# Patient Record
Sex: Female | Born: 1955 | Race: White | Hispanic: No | Marital: Married | Smoking: Never smoker
Health system: Southern US, Community
[De-identification: ages and names within clinical notes are randomized; demographics above are authoritative.]

## PROBLEM LIST (undated history)

## (undated) DIAGNOSIS — M1990 Unspecified osteoarthritis, unspecified site: Secondary | ICD-10-CM

## (undated) DIAGNOSIS — I340 Nonrheumatic mitral (valve) insufficiency: Secondary | ICD-10-CM

## (undated) DIAGNOSIS — M419 Scoliosis, unspecified: Secondary | ICD-10-CM

## (undated) DIAGNOSIS — C50919 Malignant neoplasm of unspecified site of unspecified female breast: Secondary | ICD-10-CM

## (undated) DIAGNOSIS — D259 Leiomyoma of uterus, unspecified: Secondary | ICD-10-CM

## (undated) DIAGNOSIS — I8390 Asymptomatic varicose veins of unspecified lower extremity: Secondary | ICD-10-CM

## (undated) HISTORY — DX: Nonrheumatic mitral (valve) insufficiency: I34.0

## (undated) HISTORY — DX: Malignant neoplasm of unspecified site of unspecified female breast: C50.919

## (undated) HISTORY — PX: OTHER SURGICAL HISTORY: SHX169

## (undated) HISTORY — PX: GANGLION CYST EXCISION: SHX1691

## (undated) HISTORY — DX: Unspecified osteoarthritis, unspecified site: M19.90

## (undated) HISTORY — DX: Leiomyoma of uterus, unspecified: D25.9

## (undated) HISTORY — DX: Scoliosis, unspecified: M41.9

## (undated) HISTORY — DX: Asymptomatic varicose veins of unspecified lower extremity: I83.90

---

## 1996-12-05 HISTORY — PX: OTHER SURGICAL HISTORY: SHX169

## 1998-12-04 ENCOUNTER — Other Ambulatory Visit: Admission: RE | Admit: 1998-12-04 | Discharge: 1998-12-04 | Payer: Self-pay | Admitting: *Deleted

## 1999-12-08 ENCOUNTER — Other Ambulatory Visit: Admission: RE | Admit: 1999-12-08 | Discharge: 1999-12-08 | Payer: Self-pay | Admitting: *Deleted

## 2000-01-07 ENCOUNTER — Ambulatory Visit (HOSPITAL_COMMUNITY): Admission: RE | Admit: 2000-01-07 | Discharge: 2000-01-07 | Payer: Self-pay | Admitting: *Deleted

## 2000-05-09 ENCOUNTER — Encounter (INDEPENDENT_AMBULATORY_CARE_PROVIDER_SITE_OTHER): Payer: Self-pay

## 2000-05-09 ENCOUNTER — Observation Stay (HOSPITAL_COMMUNITY): Admission: RE | Admit: 2000-05-09 | Discharge: 2000-05-10 | Payer: Self-pay | Admitting: Obstetrics and Gynecology

## 2000-11-23 ENCOUNTER — Encounter (INDEPENDENT_AMBULATORY_CARE_PROVIDER_SITE_OTHER): Payer: Self-pay | Admitting: Specialist

## 2000-11-23 ENCOUNTER — Ambulatory Visit (HOSPITAL_COMMUNITY): Admission: RE | Admit: 2000-11-23 | Discharge: 2000-11-23 | Payer: Self-pay | Admitting: Obstetrics and Gynecology

## 2000-12-11 ENCOUNTER — Other Ambulatory Visit: Admission: RE | Admit: 2000-12-11 | Discharge: 2000-12-11 | Payer: Self-pay | Admitting: Obstetrics and Gynecology

## 2001-09-27 ENCOUNTER — Encounter: Admission: RE | Admit: 2001-09-27 | Discharge: 2001-09-27 | Payer: Self-pay | Admitting: Family Medicine

## 2001-09-27 ENCOUNTER — Encounter: Payer: Self-pay | Admitting: Family Medicine

## 2001-12-20 ENCOUNTER — Other Ambulatory Visit: Admission: RE | Admit: 2001-12-20 | Discharge: 2001-12-20 | Payer: Self-pay | Admitting: Obstetrics and Gynecology

## 2003-02-11 ENCOUNTER — Other Ambulatory Visit: Admission: RE | Admit: 2003-02-11 | Discharge: 2003-02-11 | Payer: Self-pay | Admitting: Obstetrics and Gynecology

## 2004-04-27 ENCOUNTER — Other Ambulatory Visit: Admission: RE | Admit: 2004-04-27 | Discharge: 2004-04-27 | Payer: Self-pay | Admitting: Obstetrics and Gynecology

## 2006-11-27 ENCOUNTER — Encounter: Admission: RE | Admit: 2006-11-27 | Discharge: 2006-11-27 | Payer: Self-pay | Admitting: Family Medicine

## 2007-05-17 ENCOUNTER — Encounter: Admission: RE | Admit: 2007-05-17 | Discharge: 2007-05-17 | Payer: Self-pay | Admitting: Orthopedic Surgery

## 2007-08-21 ENCOUNTER — Encounter: Admission: RE | Admit: 2007-08-21 | Discharge: 2007-08-21 | Payer: Self-pay | Admitting: Orthopedic Surgery

## 2009-06-22 ENCOUNTER — Encounter: Admission: RE | Admit: 2009-06-22 | Discharge: 2009-09-07 | Payer: Self-pay | Admitting: Internal Medicine

## 2011-01-24 ENCOUNTER — Ambulatory Visit (HOSPITAL_BASED_OUTPATIENT_CLINIC_OR_DEPARTMENT_OTHER): Admission: RE | Admit: 2011-01-24 | Payer: 59 | Source: Ambulatory Visit | Admitting: Surgery

## 2011-04-22 NOTE — Op Note (Signed)
[facility]  Patient:    Isabel [name], Isabel [name]                   MRN: [id number] Proc. Date: 11/23/00 Adm. Date:  [id number] Attending:  Lenoard Aden CC:         [facility]   Operative Report  PREOPERATIVE DIAGNOSIS:       Persistent menometrorrhagia with submucous                               fibroid.  POSTOPERATIVE DIAGNOSIS:      Persistent menometrorrhagia with submucous                               fibroid.  OPERATION:                    Diagnostic hysteroscopy, resectoscopic                               myomectomy.  SURGEON:                      Lenoard Aden, M.D.  ANESTHESIA:                   General by Dr. Jean Rosenthal.  ESTIMATED BLOOD LOSS:         About 50 cc.  COMPLICATIONS:                None.  FLUID DEFICIT:                200 cc.  COUNTS:                       Correct.  DISPOSITION:                  The patient to recovery in good condition.  DESCRIPTION OF PROCEDURE:     After being apprised of the risks of anesthesia, infection, bleeding, uterine perforation, need for repair, the patient was brought to the operating room where she was administered general anesthesia and prepped and draped in the usual sterile fashion.  Exam under anesthesia revealed the mid positioned uterus.  No adnexal masses are appreciated.  A weighted speculum is placed.  Single-tooth tenaculum used to grasp the anterior lip of the cervix.  Dilute Pitressin solution placed at 3 and 9 oclocK at the cervicovaginal junction.  Total of 12 cc, being careful to avoid intravascular injection.  At this time, cervix easily dilated up to a #31 Pratt dilator.  Hysteroscope placed.  Visualization reveals a posterior to right lateral sidewall myoma which was resected, using a right angle double-loop atraumatically.  There is a small fibroid along the anterior wall just superior to the cervix which was also resected without difficulty. Bilateral tubal  ostia appear normal.  After resection of the fibroid down to the level with the endometrium, hysteroscope is removed.  Uterus is massaged with bimanual massage, and repeat resection is performed for some small fibroid fragments which are noted.  Good hemostasis is achieved.  Pictures are taken.  Instruments are removed with a deficit of 200 cc noted.  The patient tolerated the procedure well and is transferred to recovery in good condition. DD:  11/23/00 TD:  11/24/00 Job:  [id number]

## 2011-04-22 NOTE — Op Note (Signed)
[facility]  Patient:    Isabel [name], Isabel [name]                   MRN: [id number] Proc. Date: 05/09/00 Adm. Date:  [id number] Disc. Date: [id number] Attending:  Lenoard Aden                           Operative Report  PREOPERATIVE DIAGNOSIS:  Large submucous fibroid measuring 5 x 5 x 5 cm.  POSTOPERATIVE DIAGNOSIS:  Large submucous fibroid measuring 5 x 5 x 5 cm.  PROCEDURE:  Diagnostic hysteroscope with extensive resectoscopic resection, three hours total, dilatation and curettage.  DEFICIT:  450 cc.  COMPLICATIONS:  None.  ESTIMATED BLOOD LOSS:  About 150 cc.  SURGEON:  Lenoard Aden, M.D.  ANESTHESIA:  General.  DISPOSITION:  Patient to recovery in good condition.  DESCRIPTION OF PROCEDURE:  After being apprised of the risks of anesthesia, infection, bleeding, injury to abdominal organs with need for repair, patient is brought to the operating room where she is prepped and draped in the usual sterile fashion, put under general anesthesia and feet are placed in [place] stirrups.  Exam under anesthesia reveals a 10-week-size uterus, no adnexal masses, midplaned anteflexed uterus.  At this time, bladder is catheterized. Patient is prepped and draped in usual sterile fashion.  After removal of laminaria and two sponges, cervix easily dilates up to a #31 Pratt dilator and hysteroscope is placed.  A large posterior wall submucous fibroid, previously delineated with about a 2-cm base, is resected with a double-loop right-angle resectoscope, with an extensive resection taking three hours, down the midportion of the fibroid and then collapsing the lateral walls.  After three hours of resection, hemostasis is adequate but brisk moderate bleeding is noted.  Uterine massage decreases the bleeding somewhat.  Urine output of about 1000 cc is obtained.  Fluid deficit of ______  cc is obtained.  The pediatric Foley catheter is then placed in the uterus to  tamponade the uterine walls and insufflated with 5 cc of saline.  This is then tucked in the vagina. Good hemostasis is achieved after performance of this procedure.  Patients intraoperative sodium of 134 is noted and hematocrit of 31.  She is then transferred to recovery room in good condition.  All counts are correct. DD: 05/09/00 TD:  05/11/00 Job: [id number]

## 2011-04-22 NOTE — H&P (Signed)
[facility]  Patient:    Isabel [name], CHAMBLIN                     MRN: [id number] Adm. Date:  05/09/00 Attending:  Lenoard Aden, M.D. [facility]                         History and Physical  CHIEF COMPLAINT:              Menometrorrhagia and large submucous fibroid.  HISTORY OF PRESENT ILLNESS:   The patient is a 55 year old white female, G0, P0, LMP current, who presents with the aforementioned problems.  PAST MEDICAL HISTORY:         Her past medical history is remarkable for a hysteroscopy performed in January of 1999 for an complete resection of a large submucous fibroid which was benign on pathology.  This fibroid has recurred, now measuring 5 cm, unresponsive to two months of Lupron.  Questionable ganglion cyst removal in 1963.  No other medical or surgical problems.  MEDICATIONS:                  Lupron and iron.  ALLERGIES:                    ERYTHROMYCIN.  SOCIAL HISTORY:               She has no significant social history.  FAMILY HISTORY:               Colitis and heart disease.  PHYSICAL EXAMINATION:         The patient is a well-developed, well-nourished, white female in no apparent distress.  HEENT:                        Normal.  LUNGS:                        Clear.  HEART:                        Regular rhythm.  ABDOMEN:                      Soft, scaphoid, and nontender.  Ultrasound reveals an anteflexed uterus with a large submucous fibroid measuring 4.9 x 4.3 cm with a questionable 1.5-2 cm base on the posterior wall.  EXTREMITIES:                  No cords.  NEUROLOGIC:                   Nonfocal exam.  IMPRESSION:                   Persistent menometrorrhagia with large submucous fibroid.  PLAN:                         Proceed with diagnostic hysteroscopy and resectoscope.  The risks of anesthesia, infection, bleeding, and uterine perforation with need for repair were discussed.  Inability to  completely remove fibroids noted.  The patient acknowledges and will proceed. DD:  05/09/00 TD:  05/09/00 Job: [id number]

## 2011-08-06 HISTORY — PX: BREAST LUMPECTOMY: SHX2

## 2011-08-09 ENCOUNTER — Other Ambulatory Visit: Payer: Self-pay | Admitting: Radiology

## 2011-08-10 ENCOUNTER — Other Ambulatory Visit: Payer: Self-pay | Admitting: Radiology

## 2011-08-10 DIAGNOSIS — C50912 Malignant neoplasm of unspecified site of left female breast: Secondary | ICD-10-CM

## 2011-08-15 ENCOUNTER — Ambulatory Visit
Admission: RE | Admit: 2011-08-15 | Discharge: 2011-08-15 | Disposition: A | Discharge status: Home / Self Care | Payer: 59 | Source: Ambulatory Visit | Attending: Radiology | Admitting: Radiology

## 2011-08-15 DIAGNOSIS — C50912 Malignant neoplasm of unspecified site of left female breast: Secondary | ICD-10-CM

## 2011-08-15 MED ORDER — GADOBENATE DIMEGLUMINE 529 MG/ML IV SOLN
13.0000 mL | Freq: Once | INTRAVENOUS | Status: AC | PRN
  Administered 2011-08-15: 13 mL via INTRAVENOUS

## 2011-08-17 ENCOUNTER — Other Ambulatory Visit: Payer: Self-pay | Admitting: Oncology

## 2011-08-17 ENCOUNTER — Ambulatory Visit (HOSPITAL_BASED_OUTPATIENT_CLINIC_OR_DEPARTMENT_OTHER): Payer: 59 | Admitting: Surgery

## 2011-08-17 ENCOUNTER — Encounter (INDEPENDENT_AMBULATORY_CARE_PROVIDER_SITE_OTHER): Payer: Self-pay | Admitting: Surgery

## 2011-08-17 ENCOUNTER — Encounter (HOSPITAL_BASED_OUTPATIENT_CLINIC_OR_DEPARTMENT_OTHER): Payer: 59 | Admitting: Oncology

## 2011-08-17 VITALS — BP 146/82 | HR 82 | Temp 98.6°F | Resp 20 | Ht 66.0 in | Wt 141.6 lb

## 2011-08-17 DIAGNOSIS — D0590 Unspecified type of carcinoma in situ of unspecified breast: Secondary | ICD-10-CM

## 2011-08-17 DIAGNOSIS — D0510 Intraductal carcinoma in situ of unspecified breast: Secondary | ICD-10-CM

## 2011-08-17 LAB — COMPREHENSIVE METABOLIC PANEL
ALT: 11 U/L (ref 0–35)
AST: 13 U/L (ref 0–37)
Albumin: 3.8 g/dL (ref 3.5–5.2)
Alkaline Phosphatase: 49 U/L (ref 39–117)
BUN: 9 mg/dL (ref 6–23)
Calcium: 9.4 mg/dL (ref 8.4–10.5)
Chloride: 101 mEq/L (ref 96–112)
Creatinine, Ser: 0.59 mg/dL (ref 0.50–1.10)
Potassium: 3.6 mEq/L (ref 3.5–5.3)

## 2011-08-17 LAB — CBC WITH DIFFERENTIAL/PLATELET
BASO%: 0.5 % (ref 0.0–2.0)
Basophils Absolute: 0 10*3/uL (ref 0.0–0.1)
EOS%: 1.6 % (ref 0.0–7.0)
MCH: 30.8 pg (ref 25.1–34.0)
MCHC: 34.7 g/dL (ref 31.5–36.0)
MCV: 88.8 fL (ref 79.5–101.0)
MONO%: 11.1 % (ref 0.0–14.0)
RDW: 13.4 % (ref 11.2–14.5)
lymph#: 1.2 10*3/uL (ref 0.9–3.3)

## 2011-08-17 NOTE — Progress Notes (Signed)
MDBC  ASSESSMENT AND PLAN: 1.  DCIS left breast, UOQ, 2 o'clock.  ER/PR positive.    I discussed the options for breast cancer treatment with the patient.  I discussed the idea of a multidisciplinary approach to the treatment of breast cancer, which often includes medical oncology and radiation oncology.  The treatment plan depends on the pathologic staging of the tumor and the patient's personal wishes.  The risks of surgery include, but are not limited to, bleeding, infection, the need for further surgery, and nerve injury.  The patient has been given literature on the treatment of breast cancer.   The patient is a candidate and wants to undergo a lumpectomy.  She will need a wire localization.  She does not need a SLNBx.  2.  Mother getting surgery today for Paget's disease in [place] 3.  Heart murmur, MVP.   Has seen Dr. Becky [name]. 4.  Bilateral hip osteoarthritis.  Sees Dr. Rudene [name] at [facility]. 5.  Left handed.  Worried about surgery affecting arm.   Chief Complaint  Patient presents with  . Breast Cancer    HISTORY OF PRESENT ILLNESS: Isabel [name] is a 55 y.o. (DOB: 01-20-1956)  white female who is a patient of Dr. Juline [name] and comes to me today for left breast DCIS.  The patient has had no prior history of breast disease. She went for her regular mammogram and there was a area of microcalcification in the upper-outer quadrant of the left breast. She underwent a biopsy of 08/09/2011 this shows high-grade ductal carcinoma in situ. The tumor is ER and PR receptor positive. She still having periods, though somewhat irregular. She wonders whether this is due to stress. She has no children. She does not been on hormonal medicine. Her mother is having breast surgery today in [place] for Paget's disease. Her mother is 43 years old. Her mother has a sister, whom Isabel [name] thinks also has had breast cancer.  Path (side, TNM): DCIS Surgery: Left breast  lumpectomy  Date: ? Size of tumor: 2.2 cm   (microcalcifications) Nodes: -/- ER: 90% PR: 90% Ki67: -  HER2Neu: -  Medical Oncologist: Isabel [name] Radiation Oncologist: Isabel [name]   Past Medical History  Diagnosis Date  . Uterine fibroid   . 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 Mitral valve prolapse   . Scoliosis     Past Surgical History  Procedure Date  . Ganglion cyst excision   . Unterine fibroid 1998    removal  . Right hip resurfaced 2008   . Left hip resurfaced 2011     Current Outpatient Prescriptions  Medication Sig Dispense Refill  . calcium citrate (CALCITRATE - DOSED IN MG ELEMENTAL CALCIUM) 950 MG tablet Take 1 tablet by mouth 2 (two) times daily.        . cholecalciferol (VITAMIN D) 1000 UNITS tablet Take 1,000 Units by mouth 2 (two) times daily.        . fish oil-omega-3 fatty acids 1000 MG capsule Take 0.5 g by mouth 2 (two) times daily.        . magnesium oxide (MAG-OX) 400 MG tablet Take 300 mg by mouth 2 (two) times daily.        . vitamin E (VITAMIN E) 400 UNIT capsule Take 200 Units by mouth 2 (two) times daily.          Allergies  Allergen Reactions  . Erythromycin Diarrhea and Nausea Only  REVIEW OF SYSTEMS: Skin:  No history of rash.  No history of abnormal moles. Infection:  No history of hepatitis or HIV.  No history of MRSA. Neurologic:  No history of stroke.  No history of seizure.  No history of headaches. Cardiac: Has MVP.  Has had tachycardias.  Has seen Isabel [name].  On no therapy.  No history of prior cardiac catheterization.   Pulmonary:  Does not smoke cigarettes.  No asthma or bronchitis.  No OSA/CPAP.  Endocrine:  No diabetes. No thyroid disease. Gastrointestinal:  No history of stomach disease.  No history of liver disease.  No history of gall bladder disease.  No history of pancreas disease.  No history of colon disease. Urologic:  No history of kidney stones.  No history of bladder infections. Musculoskeletal: Bilateral  hip surgery.  Right - 2009.  Left - Dec. 2011.  Sees Dr. Tillman [name] at [facility]. Hematologic:  No bleeding disorder.  No history of anemia.  Not anticoagulated.  SOCIAL and FAMILY HISTORY: Married.  Husband came with her. She works as an Tree surgeon.  Left handed.  Wants to work [employer].  PHYSICAL EXAM: BP 146/82  Pulse 82  Temp 98.6 F (37 C)  Resp 20  Ht 5\' 6"  (1.676 m)  Wt 141 lb 9.6 oz (64.229 kg)  BMI 22.85 kg/m2  General:  WN thin WF. HEENT: Normal. Pupils equal. Normal dentition. Neck: Supple. No thyroid mass. Lymph Nodes:  No supraclavicular or cervical nodes. Lungs: Clear and symmetric. Heart:  RRR. II/VI systolic murmur Breast:  Bruise and ? Nodule in UOQ left breast.  This is out at the edge of the pectoralis major.  Her right breast is unremarkable. . Abdomen: No mass. No tenderness. No hernia. Normal bowel sounds.  No abdominal scars. Rectal: Not done. Extremities:  Good strength in upper and lower extremities. Neurologic:  Grossly intact to motor and sensory function.   DATA REVIEWED: Mammogram, MRI, pathology.  Patient has pathology report.

## 2011-08-23 ENCOUNTER — Telehealth: Payer: Self-pay | Admitting: Cardiovascular Disease

## 2011-08-23 NOTE — Telephone Encounter (Signed)
Loraine from short stay called wants the last ov, ekg, and echo faxed to [phone]

## 2011-08-23 NOTE — Telephone Encounter (Signed)
Faxed over Last OV Note and ECHO today.  No EKG's available

## 2011-08-24 ENCOUNTER — Encounter (HOSPITAL_BASED_OUTPATIENT_CLINIC_OR_DEPARTMENT_OTHER)
Admission: RE | Admit: 2011-08-24 | Discharge: 2011-08-24 | Disposition: A | Discharge status: Home / Self Care | Payer: 59 | Source: Ambulatory Visit | Attending: Surgery | Admitting: Surgery

## 2011-08-24 ENCOUNTER — Other Ambulatory Visit (INDEPENDENT_AMBULATORY_CARE_PROVIDER_SITE_OTHER): Payer: Self-pay | Admitting: Surgery

## 2011-08-24 ENCOUNTER — Ambulatory Visit
Admission: RE | Admit: 2011-08-24 | Discharge: 2011-08-24 | Disposition: A | Discharge status: Home / Self Care | Payer: 59 | Source: Ambulatory Visit | Attending: Surgery | Admitting: Surgery

## 2011-08-24 DIAGNOSIS — Z01811 Encounter for preprocedural respiratory examination: Secondary | ICD-10-CM

## 2011-08-24 LAB — BASIC METABOLIC PANEL
BUN: 11 mg/dL (ref 6–23)
CO2: 26 mEq/L (ref 19–32)
Calcium: 9.3 mg/dL (ref 8.4–10.5)
Chloride: 105 mEq/L (ref 96–112)
Creatinine, Ser: 0.64 mg/dL (ref 0.50–1.10)
Glucose, Bld: 96 mg/dL (ref 70–99)

## 2011-08-24 LAB — CBC
HCT: 38.6 % (ref 36.0–46.0)
Hemoglobin: 13.4 g/dL (ref 12.0–15.0)
MCH: 30.2 pg (ref 26.0–34.0)
MCHC: 34.7 g/dL (ref 30.0–36.0)
MCV: 86.9 fL (ref 78.0–100.0)
Platelets: 209 10*3/uL (ref 150–400)
RBC: 4.44 MIL/uL (ref 3.87–5.11)
RDW: 13.1 % (ref 11.5–15.5)
WBC: 4.2 10*3/uL (ref 4.0–10.5)

## 2011-08-24 LAB — DIFFERENTIAL
Basophils Absolute: 0.1 10*3/uL (ref 0.0–0.1)
Basophils Relative: 1 % (ref 0–1)
Eosinophils Absolute: 0.1 10*3/uL (ref 0.0–0.7)
Eosinophils Relative: 3 % (ref 0–5)
Monocytes Absolute: 0.5 10*3/uL (ref 0.1–1.0)

## 2011-08-25 ENCOUNTER — Ambulatory Visit (HOSPITAL_BASED_OUTPATIENT_CLINIC_OR_DEPARTMENT_OTHER)
Admission: RE | Admit: 2011-08-25 | Discharge: 2011-08-25 | Disposition: A | Discharge status: Home / Self Care | Payer: 59 | Source: Ambulatory Visit | Attending: Surgery | Admitting: Surgery

## 2011-08-25 ENCOUNTER — Other Ambulatory Visit (INDEPENDENT_AMBULATORY_CARE_PROVIDER_SITE_OTHER): Payer: Self-pay | Admitting: Surgery

## 2011-08-25 DIAGNOSIS — D0590 Unspecified type of carcinoma in situ of unspecified breast: Secondary | ICD-10-CM | POA: Insufficient documentation

## 2011-08-25 DIAGNOSIS — Z01812 Encounter for preprocedural laboratory examination: Secondary | ICD-10-CM | POA: Insufficient documentation

## 2011-08-25 DIAGNOSIS — Z01818 Encounter for other preprocedural examination: Secondary | ICD-10-CM | POA: Insufficient documentation

## 2011-08-25 DIAGNOSIS — Z01810 Encounter for preprocedural cardiovascular examination: Secondary | ICD-10-CM | POA: Insufficient documentation

## 2011-08-26 NOTE — Op Note (Signed)
Isabel [name], Isabel [name]            ACCOUNT NO.:  [id number]  MEDICAL RECORD NO.:  [id number]  LOCATION:                                 FACILITY:  PHYSICIAN:  Sandria Bales. Ezzard Standing, M.D.  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  PROCEDURE:  Needle localization left breast lumpectomy with wire localization.  SURGEON:  Sandria Bales. Ezzard Standing, MD  No first assistant.  ANESTHESIA:  General LMA with 30 mL of 0.25% Marcaine.  COMPLICATIONS:  None.  INDICATIONS FOR PROCEDURE:  [name] is a 55 year old white female who is a patient of Dr. Juline Patch who has been found to have DCIS in the upper outer quadrant of her left breast.  [facility]. She now comes for lumpectomy.  I discussed with her the indications, potential complications of surgery.  The potential complications include, but are not limited to, bleeding, infection, possible need for further surgery, and nerve injury.  OPERATIVE NOTE:  The patient is in a supine position, had a wire placed at [facility].  The localizationwire comes out through the left axilla.  She was taken to the operating room where she underwent a general LMA anesthesia.  Her left breast and axilla were prepped with ChloraPrep and sterilely draped.  A time-out was held and surgical checklist run.  She was given 1 g of Ancef during this procedure.  I made an incision over the lateral edge of the pectoralis major muscle and cut down to the breast tissue.  I excised a block of breast tissue approximately 3 x 6 cm and this included down to the pectoralis major.  Actually the wire went through the lateral edge of the pectoralis major and I  took a piece of the pectoralis out even though this was not an invasive cancer.  Specimen mammogram confirmed the clip, but the calcifications the wire were all in the breast, again this was really out in the axillary tail of Spence on the left breast.  Hemostasis with Bovie electrocautery.  I placed 5 clips in the biopsy bed, one at 12, 3, 6, and 9 o'clock and one in the base to mark the biopsy cavity.  The wound was irrigated with about 300 mL of saline.  The specimen itself I painted with the 6 color orientation kit.  Specimen mammogram was shot that confirm the wire, the clip, and calcification were within the specimen.  I then closed the wound with interrupted 3-0 Vicryl sutures, skin with 5- 0 Vicryl suture, painted the wound with Dermabond and sterilely dressed her wound and put her in a cover breast binder.  The patient will be discharged home today.  Return to see me in 7 to 10 days.  She knows to call for interval problem.   Sandria Bales. Ezzard Standing, M.D., FACS   DHN/MEDQ  D:  08/25/2011  T:  08/25/2011  Job:  [id number]  cc:   Gerlene Burdock  Ricki Miller, M.D. Pierce Crane, M.D., F.R.C.P.C. Lurline Hare, M.D.  Electronically Signed by Ovidio Kin M.D. on 08/26/2011 10:49:54 AM

## 2011-09-01 ENCOUNTER — Ambulatory Visit (INDEPENDENT_AMBULATORY_CARE_PROVIDER_SITE_OTHER): Payer: 59 | Admitting: Surgery

## 2011-09-01 DIAGNOSIS — D0590 Unspecified type of carcinoma in situ of unspecified breast: Secondary | ICD-10-CM

## 2011-09-01 DIAGNOSIS — D0510 Intraductal carcinoma in situ of unspecified breast: Secondary | ICD-10-CM

## 2011-09-01 NOTE — Progress Notes (Signed)
[facility]BC   ASSESSMENT AND PLAN: 1.  DCIS left breast, UOQ, 2 o'clock.   ER/PR positive.   The patient had a lumpectomy (no SLNBx) on 08/25/2011.   She had a 1.5 cm DCIS, deep margin at 0.1 cm.  2. Mother has Paget's disease.  She was treated with a central lumpect[place] in [place]  3. Heart murmur, MVP. Has seen Dr. Becky Augusta.  4. Bilateral hip osteoarthritis.  (This may limit aromatase inhibitors.)  Sees Dr. Rudene Re at [facility].  5. Vaginal discharge/bleeding.  This is being evaluated by Dr. Jorene Minors.  HISTORY OF PRESENT ILLNESS: Chief Complaint  Patient presents with  . Breast Cancer Long Term Follow Up    PO lumpectomy on left    Isabel [name] is a 55 y.o. (DOB: 02/06/1956)  white female who is a patient of Dr. Juline Patch and comes to me today for left breast cancer.  The patient had a left breast lumpectomy on 08/25/2011.  This tumor was towards the left axillary tail of Spence and was against the left pectoralis major muscle.  She has a little discomfort in the left axilla.  She is having no trouble with her left hand (she is left handed).  She is working at [employer], and wants to arrange her visit with Dr. Michell Heinrich around this. We talked about her mother's Paget's disease.  And she has an aunt (her mother's sister), who has DCIS.  She also has had some vaginal bleeding and this is being worked up by Dr. Billy Coast.  Path (side, TNM): DCIS  Surgery: Left breast lumpectomy   Date: 20 Sept 2012 Size of tumor: 1.5 cm (microcalcifications)  Nodes: -/-  ER: 97% PR: 100% Ki67: - HER2Neu: -  Medical Oncologist: Donnie Coffin    Radiation Oncologist: Michell Heinrich   PHYSICAL EXAM: There were no vitals taken for this visit.  Breast:  Left breast incision in UOQ is well healed.  We talked about creams for the scar (Mederma).  DATA REVIEWED: Copy of path given to patient.

## 2011-09-01 NOTE — Patient Instructions (Signed)
Go to the Cancer Center October 24th @ 1pm for nurse eval then 1:30 for Dr Michell Heinrich.  Call [phone] if this day is not good for you.

## 2011-09-23 ENCOUNTER — Encounter: Payer: Self-pay | Admitting: Surgery

## 2011-09-28 ENCOUNTER — Ambulatory Visit
Admission: RE | Admit: 2011-09-28 | Discharge: 2011-09-28 | Disposition: A | Discharge status: Home / Self Care | Payer: 59 | Source: Ambulatory Visit | Attending: Radiation Oncology | Admitting: Radiation Oncology

## 2011-09-28 DIAGNOSIS — Z510 Encounter for antineoplastic radiation therapy: Secondary | ICD-10-CM | POA: Insufficient documentation

## 2011-09-28 DIAGNOSIS — C50419 Malignant neoplasm of upper-outer quadrant of unspecified female breast: Secondary | ICD-10-CM | POA: Insufficient documentation

## 2011-10-06 ENCOUNTER — Encounter (INDEPENDENT_AMBULATORY_CARE_PROVIDER_SITE_OTHER): Payer: Self-pay | Admitting: Surgery

## 2011-10-11 ENCOUNTER — Ambulatory Visit: Payer: 59

## 2011-10-12 ENCOUNTER — Ambulatory Visit
Admission: RE | Admit: 2011-10-12 | Discharge: 2011-10-12 | Disposition: A | Discharge status: Home / Self Care | Payer: 59 | Source: Ambulatory Visit | Attending: Radiation Oncology | Admitting: Radiation Oncology

## 2011-10-12 DIAGNOSIS — C50419 Malignant neoplasm of upper-outer quadrant of unspecified female breast: Secondary | ICD-10-CM

## 2011-10-13 ENCOUNTER — Ambulatory Visit
Admission: RE | Admit: 2011-10-13 | Discharge: 2011-10-13 | Disposition: A | Discharge status: Home / Self Care | Payer: 59 | Source: Ambulatory Visit | Attending: Radiation Oncology | Admitting: Radiation Oncology

## 2011-10-13 ENCOUNTER — Ambulatory Visit: Payer: 59

## 2011-10-13 ENCOUNTER — Encounter: Payer: Self-pay | Admitting: Radiation Oncology

## 2011-10-13 DIAGNOSIS — C50412 Malignant neoplasm of upper-outer quadrant of left female breast: Secondary | ICD-10-CM | POA: Insufficient documentation

## 2011-10-13 NOTE — Progress Notes (Signed)
[facility] Radiation Oncology Simulation Verification Note   Name: Isabel [name] MRN: [id number]  Date: 10/13/2011  DOB: 1956-10-29  Status:outpatient    DIAGNOSIS: Malignant neoplasm of upper-outer quadrant of female breast   Primary site: Breast   Staging method: AJCC 7th Edition   Pathologic: Stage 0 (Tis, N0, cM0) signed by Lurline Hare, MD on 10/13/2011  8:55 AM   Summary: Stage 0 (Tis, N0, cM0)   POSITION: Patient is positioned supine and  Isocenter and MLCs was reviewed.. Treatment was approved.  NARRATIVE:Tolerated simulation well.

## 2011-10-14 ENCOUNTER — Ambulatory Visit
Admission: RE | Admit: 2011-10-14 | Discharge: 2011-10-14 | Disposition: A | Discharge status: Home / Self Care | Payer: 59 | Source: Ambulatory Visit | Attending: Radiation Oncology | Admitting: Radiation Oncology

## 2011-10-14 DIAGNOSIS — C50419 Malignant neoplasm of upper-outer quadrant of unspecified female breast: Secondary | ICD-10-CM

## 2011-10-14 MED ORDER — RADIAPLEXRX EX GEL
Freq: Once | CUTANEOUS | Status: AC
  Administered 2011-10-14: 15:00:00 via TOPICAL

## 2011-10-17 ENCOUNTER — Ambulatory Visit
Admission: RE | Admit: 2011-10-17 | Discharge: 2011-10-17 | Disposition: A | Discharge status: Home / Self Care | Payer: 59 | Source: Ambulatory Visit | Attending: Radiation Oncology | Admitting: Radiation Oncology

## 2011-10-18 ENCOUNTER — Ambulatory Visit
Admission: RE | Admit: 2011-10-18 | Discharge: 2011-10-18 | Disposition: A | Discharge status: Home / Self Care | Payer: 59 | Source: Ambulatory Visit | Attending: Radiation Oncology | Admitting: Radiation Oncology

## 2011-10-18 DIAGNOSIS — C50419 Malignant neoplasm of upper-outer quadrant of unspecified female breast: Secondary | ICD-10-CM

## 2011-10-18 NOTE — Progress Notes (Signed)
Weekly Management Note Current Dose:7.2 Gy  Projected Dose: 61 Gy   Narrative:  The patient presents for routine under treatment assessment.  CBCT/MVCT images/Port film x-rays were reviewed.  The chart was checked. Doing well. Some swelling in and around the lumpectomy cavity. Not painful. RN education performed.   Physical Findings: Unchanged. Medium sized seroma over lateral left breast. Scar well healed. Wt:143 lb 12.8 oz (65.227 kg)  Impression:  The patient is tolerating radiation.  Plan:  Continue treatment as planned. Continue radiaplex. Meets with PR next week to discuss tam.

## 2011-10-18 NOTE — Progress Notes (Signed)
Pt. Has had 4/25 tx to left breast. Continues to have edema post surgery. Scheduled with medical oncologist this week to discuss tamoxifen. Post sim education completed last week. Given radiaplex and alra deodorant.

## 2011-10-19 ENCOUNTER — Ambulatory Visit
Admission: RE | Admit: 2011-10-19 | Discharge: 2011-10-19 | Disposition: A | Discharge status: Home / Self Care | Payer: 59 | Source: Ambulatory Visit | Attending: Radiation Oncology | Admitting: Radiation Oncology

## 2011-10-20 ENCOUNTER — Ambulatory Visit
Admission: RE | Admit: 2011-10-20 | Discharge: 2011-10-20 | Disposition: A | Discharge status: Home / Self Care | Payer: 59 | Source: Ambulatory Visit | Attending: Radiation Oncology | Admitting: Radiation Oncology

## 2011-10-21 ENCOUNTER — Ambulatory Visit (HOSPITAL_BASED_OUTPATIENT_CLINIC_OR_DEPARTMENT_OTHER): Payer: 59 | Admitting: Oncology

## 2011-10-21 ENCOUNTER — Ambulatory Visit
Admission: RE | Admit: 2011-10-21 | Discharge: 2011-10-21 | Disposition: A | Discharge status: Home / Self Care | Payer: 59 | Source: Ambulatory Visit | Attending: Radiation Oncology | Admitting: Radiation Oncology

## 2011-10-21 ENCOUNTER — Encounter (HOSPITAL_COMMUNITY): Payer: Self-pay | Admitting: Emergency Medicine

## 2011-10-21 ENCOUNTER — Emergency Department (HOSPITAL_COMMUNITY)
Admission: EM | Admit: 2011-10-21 | Discharge: 2011-10-21 | Disposition: A | Discharge status: Home / Self Care | Payer: 59 | Attending: Emergency Medicine | Admitting: Emergency Medicine

## 2011-10-21 ENCOUNTER — Emergency Department (HOSPITAL_COMMUNITY): Payer: 59

## 2011-10-21 ENCOUNTER — Other Ambulatory Visit: Payer: Self-pay

## 2011-10-21 VITALS — BP 112/78 | HR 79 | Temp 98.9°F | Ht 67.0 in | Wt 143.0 lb

## 2011-10-21 DIAGNOSIS — R091 Pleurisy: Secondary | ICD-10-CM | POA: Insufficient documentation

## 2011-10-21 DIAGNOSIS — R079 Chest pain, unspecified: Secondary | ICD-10-CM | POA: Insufficient documentation

## 2011-10-21 DIAGNOSIS — Z9889 Other specified postprocedural states: Secondary | ICD-10-CM | POA: Insufficient documentation

## 2011-10-21 DIAGNOSIS — D0510 Intraductal carcinoma in situ of unspecified breast: Secondary | ICD-10-CM

## 2011-10-21 DIAGNOSIS — R110 Nausea: Secondary | ICD-10-CM | POA: Insufficient documentation

## 2011-10-21 DIAGNOSIS — D0590 Unspecified type of carcinoma in situ of unspecified breast: Secondary | ICD-10-CM

## 2011-10-21 DIAGNOSIS — Z853 Personal history of malignant neoplasm of breast: Secondary | ICD-10-CM | POA: Insufficient documentation

## 2011-10-21 DIAGNOSIS — M549 Dorsalgia, unspecified: Secondary | ICD-10-CM | POA: Insufficient documentation

## 2011-10-21 LAB — BASIC METABOLIC PANEL
BUN: 12 mg/dL (ref 6–23)
CO2: 27 mEq/L (ref 19–32)
Chloride: 100 mEq/L (ref 96–112)
Creatinine, Ser: 0.62 mg/dL (ref 0.50–1.10)
Glucose, Bld: 93 mg/dL (ref 70–99)

## 2011-10-21 LAB — CBC
HCT: 37.8 % (ref 36.0–46.0)
MCH: 30.6 pg (ref 26.0–34.0)
MCHC: 34.1 g/dL (ref 30.0–36.0)
MCV: 89.6 fL (ref 78.0–100.0)
RDW: 12.7 % (ref 11.5–15.5)

## 2011-10-21 LAB — CK TOTAL AND CKMB (NOT AT ARMC): Relative Index: INVALID (ref 0.0–2.5)

## 2011-10-21 MED ORDER — SODIUM CHLORIDE 0.9 % IV SOLN
250.0000 mL | INTRAVENOUS | Status: DC
  Administered 2011-10-21: 250 mL via INTRAVENOUS

## 2011-10-21 MED ORDER — IOHEXOL 300 MG/ML  SOLN
100.0000 mL | Freq: Once | INTRAMUSCULAR | Status: AC | PRN
  Administered 2011-10-21: 100 mL via INTRAVENOUS

## 2011-10-21 MED ORDER — SODIUM CHLORIDE 0.9 % IJ SOLN: 3.0000 mL | INTRAMUSCULAR | Status: DC | PRN

## 2011-10-21 MED ORDER — KETOROLAC TROMETHAMINE 30 MG/ML IJ SOLN
30.0000 mg | Freq: Once | INTRAMUSCULAR | Status: AC
  Administered 2011-10-21: 30 mg via INTRAVENOUS
  Filled 2011-10-21: qty 1

## 2011-10-21 MED ORDER — HYDROCODONE-ACETAMINOPHEN 5-325 MG PO TABS: 1.0000 | ORAL_TABLET | ORAL | Status: AC | PRN

## 2011-10-21 MED ORDER — SODIUM CHLORIDE 0.9 % IJ SOLN
3.0000 mL | Freq: Two times a day (BID) | INTRAMUSCULAR | Status: DC
  Administered 2011-10-21: 3 mL via INTRAVENOUS

## 2011-10-21 MED ORDER — NAPROXEN 500 MG PO TABS: 500.0000 mg | ORAL_TABLET | Freq: Two times a day (BID) | ORAL | Status: DC

## 2011-10-21 MED ORDER — ASPIRIN 81 MG PO CHEW
324.0000 mg | CHEWABLE_TABLET | Freq: Once | ORAL | Status: AC
  Administered 2011-10-21: 324 mg via ORAL
  Filled 2011-10-21: qty 1

## 2011-10-21 NOTE — ED Notes (Signed)
Pt awoke from sleep this am with midsternal chest pain, back pain (nonradiating from chest), nausea and chills. Pt was scheduled for radiation at 0900 this am. Completed radiation and was referred to ER "to make sure everything is okay"

## 2011-10-21 NOTE — ED Notes (Signed)
Pt transported to CT scan via stretcher with tech.

## 2011-10-21 NOTE — ED Provider Notes (Signed)
History     CSN: [id number] Arrival date & time: 10/21/2011  8:59 AM   First MD Initiated Contact with Patient 10/21/11 [phone]      Chief Complaint  Patient presents with  . Chest Pain  . Back Pain    (Consider location/radiation/quality/duration/timing/severity/associated sxs/prior treatment) Patient is a 55 y.o. female presenting with chest pain and back pain. The history is provided by the patient.  Chest Pain    Back Pain  Associated symptoms include chest pain.  She was awakened at 0500 with a dull, achy pain in the mid chest with radiation straight through to the back. Pain was worse if she lay on either side and worse when she took a deep breath. It was better when she sat up. It was not affected by exertion. She rates the pain at 5/10 currently and 7/10 at its worst. Pain is moderate in intensity. She denies cough, fever, dyspnea, or diaphoresis. She did have an episode of chills and nausea without vomiting. She went to see her oncologist who referred her to the emergency department for further evaluation. She is currently getting radiation treatment for breast cancer following lumpectomy of the left breast.  Past Medical History  Diagnosis Date  . Uterine fibroid   . 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 Mitral valve prolapse   . Scoliosis   . Cancer     breast cancer, left, lumpectomy 08/2011    Past Surgical History  Procedure Date  . Ganglion cyst excision   . Unterine fibroid 1998    removal  . Right hip resurfaced 2008   . Left hip resurfaced 2011   . Breast lumpectomy 09/12    left breast    Family History  Problem Relation Age of Onset  . Paget's disease of bone Mother   . Breast cancer Maternal Aunt     History  Substance Use Topics  . Smoking status: Never Smoker   . Smokeless tobacco: Never Used  . Alcohol Use: 4.2 oz/week    7 Glasses of wine per week    OB History    Grav Para Term Preterm Abortions TAB SAB Ect Mult Living                  Review of Systems  Cardiovascular: Positive for chest pain.  Musculoskeletal: Positive for back pain.  All other systems reviewed and are negative.    Allergies  Erythromycin  Home Medications   Current Outpatient Rx  Name Route Sig Dispense Refill  . VITAMIN C 500 MG PO CAPS Oral Take 500 mg by mouth 2 (two) times daily.      Marland Kitchen CALCIUM & MAGNESIUM CARBONATES PO Oral Take 2 tablets by mouth 2 (two) times daily. Calcium 200mg / magnesium otc. Pt's unsure of dosage of magnesium     . VITAMIN D 1000 UNITS PO TABS Oral Take 1,000 Units by mouth 2 (two) times daily.     Marland Kitchen FISH OIL 500 MG PO CAPS Oral Take 500 mg by mouth 2 (two) times daily.      Marland Kitchen VITAMIN E 200 UNITS PO TABS Oral Take 200 mg by mouth 2 (two) times daily.        BP 126/65  Pulse 94  Temp(Src) 98.3 F (36.8 C) (Oral)  Resp 18  SpO2 100%  LMP 09/16/2011  Physical Exam  Nursing note and vitals reviewed.  55 year old female who is resting comfortably and in no acute distress. Vital signs  are normal. Head is normocephalic and atraumatic. PERRLA, EOMI. Oropharynx is clear. Neck is supple without adenopathy or JVD. Lungs are clear without any rales, wheezes, rhonchi. Back is nontender there is no CVA tenderness. Heart has regular rate and rhythm without murmur. Abdomen is soft and nontender without any masses or hepatosplenomegaly peristalsis is present. There is no chest wall tenderness. Extremities have no cyanosis or edema. Neurologic: Mental status is normal, cranial nerves are intact, there are no motor Center deficits. Psychiatric: No abnormalities of mood or affect.  ED Course  Procedures (including critical care time)  Labs Reviewed - No data to display No results found.   No diagnosis found.  Results for orders placed during the hospital encounter of 10/21/11  CBC      Component Value Range   WBC 12.3 (*) 4.0 - 10.5 (K/uL)   RBC 4.22  3.87 - 5.11 (MIL/uL)   Hemoglobin 12.9  12.0 - 15.0  (g/dL)   HCT 16.1  09.6 - 04.5 (%)   MCV 89.6  78.0 - 100.0 (fL)   MCH 30.6  26.0 - 34.0 (pg)   MCHC 34.1  30.0 - 36.0 (g/dL)   RDW 40.9  81.1 - 91.4 (%)   Platelets 181  150 - 400 (K/uL)  CK TOTAL AND CKMB      Component Value Range   Total CK 54  7 - 177 (U/L)   CK, MB 2.8  0.3 - 4.0 (ng/mL)   Relative Index RELATIVE INDEX IS INVALID  0.0 - 2.5   BASIC METABOLIC PANEL      Component Value Range   Sodium 135  135 - 145 (mEq/L)   Potassium 3.8  3.5 - 5.1 (mEq/L)   Chloride 100  96 - 112 (mEq/L)   CO2 27  19 - 32 (mEq/L)   Glucose, Bld 93  70 - 99 (mg/dL)   BUN 12  6 - 23 (mg/dL)   Creatinine, Ser 7.82  0.50 - 1.10 (mg/dL)   Calcium 9.2  8.4 - 95.6 (mg/dL)   GFR calc non Af Amer >90  >90 (mL/min)   GFR calc Af Amer >90  >90 (mL/min)   Ct Angio Chest W/cm &/or Wo Cm  10/21/2011  *RADIOLOGY REPORT*  Clinical Data:  Chest pain and nausea.  History of left breast cancer.  Status post lumpectomy September, 2012.  CT ANGIOGRAPHY CHEST WITH CONTRAST  Technique:  Multidetector CT imaging of the chest was performed using the standard protocol during bolus administration of intravenous contrast.  Multiplanar CT image reconstructions including MIPs were obtained to evaluate the vascular anatomy.  Contrast: OMNIPAQUE IOHEXOL 300 MG/ML IV SOLN  Comparison:  Plain films of the chest 08/24/2011.  Findings:  No pulmonary embolus is identified.   A fluid collection is identified in the left breast without locules of gas or notable rim enhancement most consistent with a postoperative seroma measuring 8 cm AP by 3.5 cm transverse by 7.9 cm cranial-caudal. No axillary, hilar or mediastinal lymphadenopathy.  Mild cardiomegaly.  No pleural or pericardial effusion.  Lungs demonstrate some dependent atelectasis.  No focal bony abnormality.  Review of the MIP images confirms the above findings.  IMPRESSION:  1.  Negative for pulmonary embolus or acute cardiopulmonary disease. 2.  Fluid collection in the left  breast has an appearance most consistent with postoperative seroma. 3.  Mild cardiomegaly.  Original Report Authenticated By: Bernadene Bell. D'ALESSIO, M.D.      ECG shows normal sinus rhythm with a  rate of 82, no ectopy. Normal axis. Normal P wave. Normal QRS. Normal intervals. Normal ST and T waves. Impression: normal ECG. No significant change compared with ECG of 08/24/2011.  CT scan was negative for pulmonary embolism. She has gotten very good, but not complete relief with IV Toradol. Clinically she has pleurisy and will be treated with oral NSAIDs and narcotics as needed.  MDM  Pleuritic chest pain in a patient with a history of breast cancer-need to rule out pulmonary embolism. Because of high risk of pulmonary embolism, d-dimer testing is not felt to be adequate and she is sent for CT angiogram of the chest.        Dione Booze, MD 10/21/11 1233

## 2011-10-21 NOTE — Progress Notes (Signed)
Pt. brought to nursing stating she was having some chest pain. Thinks it may be spasm as she sees chiropractor monthly but has missed in the last month.vitals stable except for low-grade temp. 99.4 having some nausea and chills as well.after nursing assessment pt. Opted not to see rad/onc doc as she has med/onc appointment at 9 am.has taken a muscle relaxant and doesn't  feel as bad.

## 2011-10-22 ENCOUNTER — Ambulatory Visit
Admission: RE | Admit: 2011-10-22 | Discharge: 2011-10-22 | Disposition: A | Discharge status: Home / Self Care | Payer: 59 | Source: Ambulatory Visit | Attending: Radiation Oncology | Admitting: Radiation Oncology

## 2011-10-24 ENCOUNTER — Ambulatory Visit
Admission: RE | Admit: 2011-10-24 | Discharge: 2011-10-24 | Disposition: A | Discharge status: Home / Self Care | Payer: 59 | Source: Ambulatory Visit | Attending: Radiation Oncology | Admitting: Radiation Oncology

## 2011-10-25 ENCOUNTER — Ambulatory Visit
Admission: RE | Admit: 2011-10-25 | Discharge: 2011-10-25 | Disposition: A | Discharge status: Home / Self Care | Payer: 59 | Source: Ambulatory Visit | Attending: Radiation Oncology | Admitting: Radiation Oncology

## 2011-10-25 DIAGNOSIS — C50419 Malignant neoplasm of upper-outer quadrant of unspecified female breast: Secondary | ICD-10-CM

## 2011-10-25 NOTE — Progress Notes (Signed)
Weekly Management Note Current Dose:18 Gy  Projected Dose:45 Gy   Narrative:  The patient presents for routine under treatment assessment.  CBCT/MVCT images/Port film x-rays were reviewed.  The chart was checked. Went to ER on Friday for chest pain. W/U revealed pleuritis which was relieved with tramadol in ER. No further sx.  Some itching medially this morning. Using radiaplex.   Physical Findings: Weight: 143 lb 4.8 oz (65 kg). Unchanged  Impression:  The patient is tolerating radiation.  Plan:  Continue treatment as planned. Continue radiaplex.

## 2011-10-25 NOTE — Progress Notes (Signed)
On Friday 10/21/11 pt. Was taken to ed from med/onc dept. For work up for right rib-cage pain and chest pain. Cardiac work-up ok but pt. States ct scan Revealed pleurisy. Took anti-inflammatory in ED. States told to take naproxen but states all symptoms resolved so she hasnot taken naproxen or  Hydrocodone.left breast with mild redness. No concerns today. Vital also within normal range.

## 2011-10-26 ENCOUNTER — Ambulatory Visit
Admission: RE | Admit: 2011-10-26 | Discharge: 2011-10-26 | Disposition: A | Discharge status: Home / Self Care | Payer: 59 | Source: Ambulatory Visit | Attending: Radiation Oncology | Admitting: Radiation Oncology

## 2011-10-31 ENCOUNTER — Ambulatory Visit: Admission: RE | Admit: 2011-10-31 | Payer: 59 | Source: Ambulatory Visit

## 2011-10-31 ENCOUNTER — Ambulatory Visit
Admission: RE | Admit: 2011-10-31 | Discharge: 2011-10-31 | Disposition: A | Discharge status: Home / Self Care | Payer: 59 | Source: Ambulatory Visit | Attending: Radiation Oncology | Admitting: Radiation Oncology

## 2011-11-01 ENCOUNTER — Ambulatory Visit
Admission: RE | Admit: 2011-11-01 | Discharge: 2011-11-01 | Disposition: A | Discharge status: Home / Self Care | Payer: 59 | Source: Ambulatory Visit | Attending: Radiation Oncology | Admitting: Radiation Oncology

## 2011-11-01 VITALS — Wt 139.5 lb

## 2011-11-01 DIAGNOSIS — C50419 Malignant neoplasm of upper-outer quadrant of unspecified female breast: Secondary | ICD-10-CM

## 2011-11-01 NOTE — Progress Notes (Signed)
Weekly Management Note Current Dose:   cGy  Projected Dose:  cGy   Narrative:  The patient presents for routine under treatment assessment.  CBCT/MVCT images/Port film x-rays were reviewed.  The chart was checked. Doing well. Some itching medially. Cough/congestion.   Physical Findings: Weight: 139 lb 8 oz (63.277 kg).  Rash medially.   Impression:  The patient is tolerating radiation.  Plan:  Continue treatment as planned. Add hydrocortisone prn.

## 2011-11-01 NOTE — Progress Notes (Signed)
Very slight erythema onleft breast, pt c/o "i have a cold", using ludens cough drops prn, coughing dry cough 8:13 AM

## 2011-11-02 ENCOUNTER — Ambulatory Visit
Admission: RE | Admit: 2011-11-02 | Discharge: 2011-11-02 | Disposition: A | Discharge status: Home / Self Care | Payer: 59 | Source: Ambulatory Visit | Attending: Radiation Oncology | Admitting: Radiation Oncology

## 2011-11-03 ENCOUNTER — Ambulatory Visit
Admission: RE | Admit: 2011-11-03 | Discharge: 2011-11-03 | Disposition: A | Discharge status: Home / Self Care | Payer: 59 | Source: Ambulatory Visit | Attending: Radiation Oncology | Admitting: Radiation Oncology

## 2011-11-04 ENCOUNTER — Ambulatory Visit
Admission: RE | Admit: 2011-11-04 | Discharge: 2011-11-04 | Disposition: A | Discharge status: Home / Self Care | Payer: 59 | Source: Ambulatory Visit | Attending: Radiation Oncology | Admitting: Radiation Oncology

## 2011-11-07 ENCOUNTER — Ambulatory Visit
Admission: RE | Admit: 2011-11-07 | Discharge: 2011-11-07 | Disposition: A | Discharge status: Home / Self Care | Payer: 59 | Source: Ambulatory Visit | Attending: Radiation Oncology | Admitting: Radiation Oncology

## 2011-11-08 ENCOUNTER — Ambulatory Visit
Admission: RE | Admit: 2011-11-08 | Discharge: 2011-11-08 | Disposition: A | Discharge status: Home / Self Care | Payer: 59 | Source: Ambulatory Visit | Attending: Radiation Oncology | Admitting: Radiation Oncology

## 2011-11-08 DIAGNOSIS — C50419 Malignant neoplasm of upper-outer quadrant of unspecified female breast: Secondary | ICD-10-CM

## 2011-11-08 NOTE — Progress Notes (Signed)
Mild fatigue. Getting over a cold, has some congestion mainly in the morning. Mild hyperpigmentation with follicular reaction mid upper chest. Has completed 18 of 25 treatments to left breast.

## 2011-11-08 NOTE — Progress Notes (Signed)
Weekly Management Note Current Dose: 32.4  Gy  Projected Dose:  61 Gy   Narrative:  The patient presents for routine under treatment assessment.  CBCT/MVCT images/Port film x-rays were reviewed.  The chart was checked. Doing   Physical Findings: Weight: 141. Dermatitis worse medially. Large seroma in axilla.  Impression:  The patient is tolerating radiation.  Plan:  Continue treatment as planned. Continue radiaplex. Add hydrocortisone prn.

## 2011-11-09 ENCOUNTER — Ambulatory Visit
Admission: RE | Admit: 2011-11-09 | Discharge: 2011-11-09 | Disposition: A | Discharge status: Home / Self Care | Payer: 59 | Source: Ambulatory Visit | Attending: Radiation Oncology | Admitting: Radiation Oncology

## 2011-11-10 ENCOUNTER — Encounter: Payer: Self-pay | Admitting: *Deleted

## 2011-11-10 ENCOUNTER — Ambulatory Visit
Admission: RE | Admit: 2011-11-10 | Discharge: 2011-11-10 | Disposition: A | Discharge status: Home / Self Care | Payer: 59 | Source: Ambulatory Visit | Attending: Radiation Oncology | Admitting: Radiation Oncology

## 2011-11-10 DIAGNOSIS — C50419 Malignant neoplasm of upper-outer quadrant of unspecified female breast: Secondary | ICD-10-CM

## 2011-11-10 MED ORDER — RADIAPLEXRX EX GEL
Freq: Once | CUTANEOUS | Status: AC
  Administered 2011-11-10: 1 via TOPICAL

## 2011-11-10 NOTE — Progress Notes (Signed)
Pt came to nursing asking for another radiaplex gel"i'm almost out" and another tube given, also c/o of " Just started to itch on left chest wall top of breast," hydrocortisone cream suggested to try,pt gave verbal understanding 8:37 AM .

## 2011-11-11 ENCOUNTER — Ambulatory Visit
Admission: RE | Admit: 2011-11-11 | Discharge: 2011-11-11 | Disposition: A | Discharge status: Home / Self Care | Payer: 59 | Source: Ambulatory Visit | Attending: Radiation Oncology | Admitting: Radiation Oncology

## 2011-11-14 ENCOUNTER — Encounter (INDEPENDENT_AMBULATORY_CARE_PROVIDER_SITE_OTHER): Payer: Self-pay

## 2011-11-14 ENCOUNTER — Encounter (INDEPENDENT_AMBULATORY_CARE_PROVIDER_SITE_OTHER): Payer: Self-pay | Admitting: Surgery

## 2011-11-14 ENCOUNTER — Ambulatory Visit
Admission: RE | Admit: 2011-11-14 | Discharge: 2011-11-14 | Disposition: A | Discharge status: Home / Self Care | Payer: 59 | Source: Ambulatory Visit | Attending: Radiation Oncology | Admitting: Radiation Oncology

## 2011-11-15 ENCOUNTER — Ambulatory Visit
Admission: RE | Admit: 2011-11-15 | Discharge: 2011-11-15 | Disposition: A | Discharge status: Home / Self Care | Payer: 59 | Source: Ambulatory Visit | Attending: Radiation Oncology | Admitting: Radiation Oncology

## 2011-11-15 DIAGNOSIS — C50419 Malignant neoplasm of upper-outer quadrant of unspecified female breast: Secondary | ICD-10-CM

## 2011-11-15 NOTE — Progress Notes (Signed)
Weekly Management Note Current Dose: 41.4  Gy  Projected Dose: 61 Gy   Narrative:  The patient presents for routine under treatment assessment.  CBCT/MVCT images/Port film x-rays were reviewed.  The chart was checked. Skin irritated but cortisone helpful. Checked ebeam m/o on machine today.   Physical Findings: Dermatitis throughout breast. No desquamation.   Impression:  The patient is tolerating radiation.  Plan:  Continue treatment as planned. Continue radiaplex/hydrocortisone.

## 2011-11-16 ENCOUNTER — Ambulatory Visit
Admission: RE | Admit: 2011-11-16 | Discharge: 2011-11-16 | Disposition: A | Discharge status: Home / Self Care | Payer: 59 | Source: Ambulatory Visit | Attending: Radiation Oncology | Admitting: Radiation Oncology

## 2011-11-17 ENCOUNTER — Ambulatory Visit
Admission: RE | Admit: 2011-11-17 | Discharge: 2011-11-17 | Disposition: A | Discharge status: Home / Self Care | Payer: 59 | Source: Ambulatory Visit | Attending: Radiation Oncology | Admitting: Radiation Oncology

## 2011-11-18 ENCOUNTER — Ambulatory Visit
Admission: RE | Admit: 2011-11-18 | Discharge: 2011-11-18 | Disposition: A | Discharge status: Home / Self Care | Payer: 59 | Source: Ambulatory Visit | Attending: Radiation Oncology | Admitting: Radiation Oncology

## 2011-11-21 ENCOUNTER — Ambulatory Visit
Admission: RE | Admit: 2011-11-21 | Discharge: 2011-11-21 | Disposition: A | Discharge status: Home / Self Care | Payer: 59 | Source: Ambulatory Visit | Attending: Radiation Oncology | Admitting: Radiation Oncology

## 2011-11-22 ENCOUNTER — Ambulatory Visit
Admission: RE | Admit: 2011-11-22 | Discharge: 2011-11-22 | Disposition: A | Discharge status: Home / Self Care | Payer: 59 | Source: Ambulatory Visit | Attending: Radiation Oncology | Admitting: Radiation Oncology

## 2011-11-22 DIAGNOSIS — C50419 Malignant neoplasm of upper-outer quadrant of unspecified female breast: Secondary | ICD-10-CM

## 2011-11-22 NOTE — Progress Notes (Signed)
Pt denies pain, applying Radiaplex to skin in tx area.

## 2011-11-22 NOTE — Progress Notes (Signed)
DIAGNOSIS:  Left breast cancer.  NARRATIVE:  Isabel [name] is seen today for weekly assessment.  She has completed 5,100 cGy of a planned 6,100 cGy directed at the left breast area.  The patient does have some pruritus within the breast and some discomfort.  She continues to use RadiaPlex and occasionally hydrocortisone cream.  The patient has had a mild cough but appears to be getting over an upper respiratory illness.  The patient denies any fever or chills.  PHYSICAL EXAMINATION:  Vital Signs:  The patient's weight is 140.5 pounds, which is up approximately 8 pounds since her weighing in late November.  Lung Examination:  Clear.  Heart:  Regular rhythm and rate. Breasts:  The left breast area shows some erythema and hyperpigmentation changes and dermatitis, but no moist desquamation.  IMPRESSION AND PLAN:  The patient is tolerating her treatments well, except for the issues as above.  The patient's radiation fields are setting up accurately.  The patient's radiation chart was checked today. The plan is to continue with 5 additional treatments to a cumulative dose of 6,100 cGy.    ______________________________ Billie Lade, Ph.D., M.D. JDK/MEDQ  D:  11/22/2011  T:  11/22/2011  Job:  [id number]

## 2011-11-23 ENCOUNTER — Ambulatory Visit
Admission: RE | Admit: 2011-11-23 | Discharge: 2011-11-23 | Disposition: A | Discharge status: Home / Self Care | Payer: 59 | Source: Ambulatory Visit | Attending: Radiation Oncology | Admitting: Radiation Oncology

## 2011-11-24 ENCOUNTER — Ambulatory Visit
Admission: RE | Admit: 2011-11-24 | Discharge: 2011-11-24 | Disposition: A | Discharge status: Home / Self Care | Payer: 59 | Source: Ambulatory Visit | Attending: Radiation Oncology | Admitting: Radiation Oncology

## 2011-11-25 ENCOUNTER — Ambulatory Visit
Admission: RE | Admit: 2011-11-25 | Discharge: 2011-11-25 | Disposition: A | Discharge status: Home / Self Care | Payer: 59 | Source: Ambulatory Visit | Attending: Radiation Oncology | Admitting: Radiation Oncology

## 2011-11-27 NOTE — Progress Notes (Signed)
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 Patient is here for followup. She recovered well from surgery. He has seen Dr. Michell Heinrich has started radiation therapy. We'll likely finish sometime at the end of December. I spent time with her today discussing adjuvant hormonal therapy. I have recommended tamoxifen. I discussed side effects with her. I will see her sometime in January after she completes her radiation therapy.

## 2011-11-28 ENCOUNTER — Ambulatory Visit
Admission: RE | Admit: 2011-11-28 | Discharge: 2011-11-28 | Disposition: A | Discharge status: Home / Self Care | Payer: 59 | Source: Ambulatory Visit | Attending: Radiation Oncology | Admitting: Radiation Oncology

## 2011-11-30 ENCOUNTER — Ambulatory Visit
Admission: RE | Admit: 2011-11-30 | Discharge: 2011-11-30 | Disposition: A | Discharge status: Home / Self Care | Payer: 59 | Source: Ambulatory Visit | Attending: Radiation Oncology | Admitting: Radiation Oncology

## 2011-11-30 ENCOUNTER — Encounter: Payer: Self-pay | Admitting: Radiation Oncology

## 2011-11-30 DIAGNOSIS — C50419 Malignant neoplasm of upper-outer quadrant of unspecified female breast: Secondary | ICD-10-CM

## 2011-11-30 NOTE — Progress Notes (Signed)
Weekly Management Note Current Dose: 61  Gy  Projected Dose: 61 Gy   Narrative:  The patient presents for routine under treatment assessment.  CBCT/MVCT images/Port film x-rays were reviewed.  The chart was checked. Finishes today. Feels like she is coming down with a cold. Minor skin irritation controlled with radiaplex and hydrocortisone.  Physical Findings: Weight: 139 lb 12.8 oz (63.413 kg). Erythema over left breast.  Impression:  The patient is tolerating radiation.  Plan:  Finishes today. F/u in 1 month. Has f/u scheduled with PR to discuss tam.

## 2011-11-30 NOTE — Progress Notes (Signed)
Patient presents to the clinic today following final XRT. Patient is unaccompanied. Patient is alert and oriented to person, place, and time. No distress noted. Steady gait noted. Pleasant affect noted. Patient denies pain. Patient reports adding zinc and vitamin c to her medication regimen because "she feels like she might be coming down with something." Reinforced skin care. Provided patient with an appointment card to return in one month for follow up. Encouraged patient to contact staff with needs. Patient verbalized understanding.

## 2011-12-05 ENCOUNTER — Encounter: Payer: Self-pay | Admitting: Radiation Oncology

## 2011-12-05 DIAGNOSIS — C50419 Malignant neoplasm of upper-outer quadrant of unspecified female breast: Secondary | ICD-10-CM

## 2011-12-05 NOTE — Progress Notes (Signed)
Name: Isabel [name]   MRN: [id number]  Date:  11/09/11    DOB: [birth year]  Status:outpatient    DIAGNOSIS: Breast cancer.  CONSENT VERIFIED: yes   SET UP: Patient is setup supine   IMMOBILIZATION:  The following immobilization was used:Custom Moldable Pillow, breast board.   NARRATIVE: Isabel [name] underwent complex simulation and treatment planning for her boost treatment today.  Her tumor volume was outlined on the planning CT scan.  electrons will be prescribed to the 91% isodose line.  A total of one block will be used for beam modification purposes.  A special port plan is requested.

## 2011-12-05 NOTE — Progress Notes (Signed)
[facility] Radiation Oncology Simulation Verification Note   Name: TANAIYA KOLARIK MRN: [id number]   Date: 10/12/11  DOB: August 07, 1956  Status:outpatient   DIAGNOSIS:  1. Malignant neoplasm of upper-outer quadrant of female breast     POSITION: Patient was placed in the supine position on the treatment machine.  Isocenter and MLCS were reviewed and treatment was approved.  NARRATIVE: Patient tolerated simulation well.

## 2011-12-05 NOTE — Progress Notes (Signed)
CC:   Pierce Crane, M.D., F.R.C.P.C. Sandria Bales. Ezzard Standing, M.D.  DIAGNOSIS:  Ductal carcinoma in situ of the left breast.  TREATMENT DATES:  10/13/2011 to 11/30/2011.  ANATOMIC REGION TREATED:  Left breast. Left breast boost.  DOSE: 1. 45 Gy at 1.8 Gy per fraction. 2. 16 Gy at 2 Gy per fraction x8 fractions.  BEAM ARRANGEMENT: 1. Opposed tangents with reduced fields. 2. En face electrons.  TREATMENT TOLERANCE:  Meggie struggled with a large seroma cavity throughout the course of her treatment.  She also had the expected dry desquamation.  FOLLOWUP:  I will plan on seeing her back in 1 month's time.  She also has regularly scheduled followup with Dr. Ezzard Standing and Dr. Donnie Coffin.    ______________________________ Lurline Hare, M.D. SW/MEDQ  D:  12/05/2011  T:  12/05/2011  Job:  [id number]

## 2011-12-05 NOTE — Progress Notes (Signed)
Complex simulation and treatment planning took place on 10/04/2011 

## 2011-12-13 ENCOUNTER — Other Ambulatory Visit: Payer: 59 | Admitting: Lab

## 2011-12-13 ENCOUNTER — Telehealth: Payer: Self-pay | Admitting: Oncology

## 2011-12-13 ENCOUNTER — Ambulatory Visit (HOSPITAL_BASED_OUTPATIENT_CLINIC_OR_DEPARTMENT_OTHER): Payer: 59 | Admitting: Oncology

## 2011-12-13 VITALS — BP 127/80 | HR 74 | Temp 98.2°F | Ht 67.0 in | Wt 138.9 lb

## 2011-12-13 DIAGNOSIS — C50419 Malignant neoplasm of upper-outer quadrant of unspecified female breast: Secondary | ICD-10-CM

## 2011-12-13 MED ORDER — TAMOXIFEN CITRATE 20 MG PO TABS: 20.0000 mg | ORAL_TABLET | Freq: Every day | ORAL | Status: AC

## 2011-12-13 NOTE — Telephone Encounter (Signed)
Gv pt appt for april2013 °

## 2011-12-13 NOTE — Patient Instructions (Signed)

## 2011-12-13 NOTE — Progress Notes (Signed)
Hematology and Oncology Follow Up Visit  Isabel [name] [id number] [birth year] 56 y.o. 12/13/2011 12:03 PM PCP Dr Michell Heinrich; dr pang; dr Billy Coast Principle Diagnosis: DCIS , er+ s/p lumpectomy DNSWeekly.fi xrt in 12/12  Interim History:  There have been no intercurrent illness, hospitalizations or medication changes.  Medications: I have reviewed the patient's current medications.  Allergies:  Allergies  Allergen Reactions  . Erythromycin Diarrhea and Nausea Only    Past Medical History, Surgical history, Social history, and Family History were reviewed and updated.  Review of Systems: Constitutional:  Negative for fever, chills, night sweats, anorexia, weight loss, pain. Cardiovascular: no chest pain or dyspnea on exertion Respiratory: no cough, shortness of breath, or wheezing Neurological: negative Dermatological: negative ENT: negative Skin Gastrointestinal: no abdominal pain, change in bowel habits, or black or bloody stools Genito-Urinary: no dysuria, trouble voiding, or hematuria Hematological and Lymphatic: negative Breast: negative Musculoskeletal: negative Remaining ROS negative.  Physical Exam: Blood pressure 127/80, pulse 74, temperature 98.2 F (36.8 C), height 5\' 7"  (1.702 m), weight 138 lb 14.4 oz (63.005 kg). ECOG: 0 General appearance: alert, cooperative and appears stated age Head: Normocephalic, without obvious abnormality, atraumatic Neck: no adenopathy, no carotid bruit, no JVD, supple, symmetrical, trachea midline and thyroid not enlarged, symmetric, no tenderness/mass/nodules Lymph nodes: Cervical, supraclavicular, and axillary nodes normal. Cardiac : regular rate and rhythm, no murmurs or gallops Pulmonary:clear to auscultation bilaterally and normal percussion bilaterally Breasts: inspection negative, no nipple discharge or bleeding, no masses or nodularity palpable Abdomen:soft, non-tender; bowel sounds normal; no masses,  no  organomegaly Extremities negative Neuro: alert, oriented, normal speech, no focal findings or movement disorder noted  Lab Results: Lab Results  Component Value Date   WBC 12.3* 10/21/2011   HGB 12.9 10/21/2011   HCT 37.8 10/21/2011   MCV 89.6 10/21/2011   PLT 181 10/21/2011     Chemistry      Component Value Date/Time   NA 135 10/21/2011 1020   K 3.8 10/21/2011 1020   CL 100 10/21/2011 1020   CO2 27 10/21/2011 1020   BUN 12 10/21/2011 1020   CREATININE 0.62 10/21/2011 1020      Component Value Date/Time   CALCIUM 9.2 10/21/2011 1020   ALKPHOS 49 08/17/2011 0833   AST 13 08/17/2011 0833   ALT 11 08/17/2011 0833   BILITOT 0.5 08/17/2011 0833      .pathology. Radiological Studies: chest X-ray n/a Mammogram Due 6 /13 Bone density 11/12- wnl  Impression and Plan: 56 yo with er+ dcis, s/p lumpectomy and xrt ; we discussed tamoxifen and s/e ; I have sen in her rx and will see her in 3 months.  More than 50% of the visit was spent in patient-related counselling   Pierce Crane, MD 1/8/201312:03 PM

## 2011-12-29 DIAGNOSIS — Z96643 Presence of artificial hip joint, bilateral: Secondary | ICD-10-CM | POA: Insufficient documentation

## 2012-01-05 ENCOUNTER — Ambulatory Visit
Admission: RE | Admit: 2012-01-05 | Discharge: 2012-01-05 | Disposition: A | Discharge status: Home / Self Care | Payer: 59 | Source: Ambulatory Visit | Attending: Radiation Oncology | Admitting: Radiation Oncology

## 2012-01-05 DIAGNOSIS — C50419 Malignant neoplasm of upper-outer quadrant of unspecified female breast: Secondary | ICD-10-CM

## 2012-01-05 NOTE — Progress Notes (Signed)
Here for 1 onth follow up post radiation to left breast. Has started tamoxifen. Denies pain.

## 2012-01-06 NOTE — Progress Notes (Signed)
CC:   Pierce Crane, M.D., F.R.C.P.C. Isabel [name]. Ezzard Standing, M.D.  DIAGNOSIS:  Ductal carcinoma in situ of the left breast.  PREVIOUS TREATMENT:  Radiation to the left breast to a total dose of 61 Gy.  INTERVAL SINCE TREATMENT:  One month.  INTERVAL HISTORY:  [name] reports for follow-up today.  She feels her seroma cavity is getting slightly smaller.  She is taking her tamoxifen and doing well.  She has not had a period since she started it, but she is not having any hot flashes.  She had a negative endometrial biopsy prior to starting.  She otherwise is doing well.  She had some questions about screening with a mammogram versus MRI.  PHYSICAL EXAMINATION:  General Appearance:  She is a pleasant female in no distress.  She really has an excellent cosmetic result.  She does have a palpable seroma cavity measuring about 4 cm in the upper-outer quadrant of her left breast.  This is smooth and well marginated. Otherwise, no other abnormalities.  IMPRESSION:  Ductal carcinoma in situ of the left breast with resolving acute effects of treatments, now on tamoxifen.  RECOMMENDATIONS:  Isabel [name] looks great.  I have released her from follow- up with me.  She has regularly scheduled follow-up with Dr. Ezzard Standing and Dr. Donnie Coffin.  I encouraged her to contact us with any questions.  We discussed skin protection on irradiated skin.    ______________________________ Lurline Hare, M.D. SW/MEDQ  D:  01/05/2012  T:  01/06/2012  Job:  [id number]

## 2012-03-01 ENCOUNTER — Encounter (INDEPENDENT_AMBULATORY_CARE_PROVIDER_SITE_OTHER): Payer: Self-pay | Admitting: Surgery

## 2012-03-01 ENCOUNTER — Ambulatory Visit (INDEPENDENT_AMBULATORY_CARE_PROVIDER_SITE_OTHER): Payer: 59 | Admitting: Surgery

## 2012-03-01 VITALS — BP 118/72 | HR 88 | Temp 98.1°F | Resp 18 | Ht 67.0 in | Wt 137.0 lb

## 2012-03-01 DIAGNOSIS — D0510 Intraductal carcinoma in situ of unspecified breast: Secondary | ICD-10-CM

## 2012-03-01 DIAGNOSIS — D0590 Unspecified type of carcinoma in situ of unspecified breast: Secondary | ICD-10-CM

## 2012-03-01 NOTE — Progress Notes (Signed)
MDBC   ASSESSMENT AND PLAN: 1.  DCIS left breast, UOQ, 2 o'clock.   ER/PR positive.   The patient had a lumpectomy (no SLNBx) on 08/25/2011.   She had a 1.5 cm DCIS, deep margin at 0.1 cm.  She completed rad tx in Dec 2012.  She is now on Tamoxifen.  2. Mother has Paget's disease.  She was treated with a central lumpectomy in [place]  3. Heart murmur, MVP. Has seen Dr. Becky Augusta.  4. Bilateral hip osteoarthritis.  (This may limit aromatase inhibitors.)  Se[facility] Rudene [facility].  5. Vaginal discharge/bleeding.  Evaluated by Dr. Jorene Minors. 6.  Has approx 5 to 6 cm mass at biopsy site - seroma/hematoma/debris in biopsy site.  I will see her back in 3 months to check biopsy site.  US done and photos taken.  HISTORY OF PRESENT ILLNESS: Chief Complaint  Patient presents with  . Breast Cancer Long Term Follow Up    Isabel [name] is a 56 y.o. (DOB: 30-Jan-1956)  white female who is a patient of Dr. Juline Patch and comes to me today for follow up of left breast cancer.  The patient had a left breast lumpectomy on 08/25/2011.  This tumor was towards the left axillary tail of Spence and was against the left pectoralis major muscle.  She has a mass at the biopsy site (as she said, she had a "lumpectomy" and she still has a "lump").  It causes minimal discomfort, but does pull.  Her mother's has Paget's disease.  And she has an aunt (her mother's sister), who has DCIS.  Path (side, TNM): DCIS  Surgery: Left breast lumpectomy   Date: 20 Sept 2012 Size of tumor: 1.5 cm (microcalcifications)  Nodes: -/-  ER: 97% PR: 100% Ki67: - HER2Neu: -  Medical Oncologist: Donnie Coffin    Radiation Oncologist: Michell Heinrich   PHYSICAL EXAM: BP 118/72  Pulse 88  Temp(Src) 98.1 F (36.7 C) (Temporal)  Resp 18  Ht 5\' 7"  (1.702 m)  Wt 137 lb (62.143 kg)  BMI 21.46 kg/m2   HEENT:  Pupils equal.  Dentition good.  No injury. NECK:  Supple.  No thyroid mass. LYMPH NODES:  No cervical,  supraclavicular, or axillary adenopathy. BREASTS -  RIGHT:  No palpable mass or nodule.  No nipple discharge.   LEFT:  Left breast incision in UOQ is well healed.  She has a 5 to 6 cm mass at the biopsy site.  US shows mixed echogenicity, c/w seroma/hematoma/debris in biopsy cavity. UPPER EXTREMITIES:  No evidence of lymphedema.  DATA REVIEWED: None new.

## 2012-03-12 ENCOUNTER — Other Ambulatory Visit (HOSPITAL_BASED_OUTPATIENT_CLINIC_OR_DEPARTMENT_OTHER): Payer: 59

## 2012-03-12 DIAGNOSIS — C50419 Malignant neoplasm of upper-outer quadrant of unspecified female breast: Secondary | ICD-10-CM

## 2012-03-12 LAB — CBC WITH DIFFERENTIAL/PLATELET
BASO%: 1 % (ref 0.0–2.0)
HCT: 37.2 % (ref 34.8–46.6)
LYMPH%: 15 % (ref 14.0–49.7)
MCHC: 34.6 g/dL (ref 31.5–36.0)
MCV: 90.4 fL (ref 79.5–101.0)
MONO#: 0.4 10*3/uL (ref 0.1–0.9)
NEUT%: 68.5 % (ref 38.4–76.8)
Platelets: 194 10*3/uL (ref 145–400)
WBC: 3.3 10*3/uL — ABNORMAL LOW (ref 3.9–10.3)

## 2012-03-12 LAB — COMPREHENSIVE METABOLIC PANEL
AST: 13 U/L (ref 0–37)
BUN: 13 mg/dL (ref 6–23)
CO2: 26 mEq/L (ref 19–32)
Calcium: 9.3 mg/dL (ref 8.4–10.5)
Chloride: 107 mEq/L (ref 96–112)
Creatinine, Ser: 0.77 mg/dL (ref 0.50–1.10)

## 2012-03-22 ENCOUNTER — Encounter: Payer: Self-pay | Admitting: Physician Assistant

## 2012-03-22 ENCOUNTER — Telehealth: Payer: Self-pay | Admitting: *Deleted

## 2012-03-22 ENCOUNTER — Ambulatory Visit: Payer: 59 | Admitting: Physician Assistant

## 2012-03-22 ENCOUNTER — Ambulatory Visit (HOSPITAL_BASED_OUTPATIENT_CLINIC_OR_DEPARTMENT_OTHER): Payer: 59 | Admitting: Physician Assistant

## 2012-03-22 VITALS — BP 142/81 | HR 109 | Temp 98.5°F | Ht 67.0 in | Wt 135.4 lb

## 2012-03-22 DIAGNOSIS — D0590 Unspecified type of carcinoma in situ of unspecified breast: Secondary | ICD-10-CM

## 2012-03-22 DIAGNOSIS — Z79810 Long term (current) use of selective estrogen receptor modulators (SERMs): Secondary | ICD-10-CM

## 2012-03-22 DIAGNOSIS — Z170 Estrogen receptor positive status [ER+]: Secondary | ICD-10-CM

## 2012-03-22 DIAGNOSIS — D0510 Intraductal carcinoma in situ of unspecified breast: Secondary | ICD-10-CM

## 2012-03-22 MED ORDER — TAMOXIFEN CITRATE 20 MG PO TABS: 20.0000 mg | ORAL_TABLET | Freq: Every day | ORAL | Status: DC

## 2012-03-22 NOTE — Telephone Encounter (Signed)
Gave patient appointment for 07-23-2012 starting at 8:15am printed out calendar and gave to the patient

## 2012-03-22 NOTE — Progress Notes (Signed)
Hematology and Oncology Follow Up Visit  Isabel [name] [id number] 02/16/1956 56 y.o. 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 The DCIS component measured 1.5 cm, radiation was completed December of 2012. She is currently on tamoxifen 20 mg by mouth daily.  2. 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 She is continuing on tamoxifen 20 mg by mouth daily, which actually is tolerating quite well. She denies any fevers, chills, or night sweats, though she does state occasionally she wakes up in his "warm", but she does not feel that it actually a hot flash per se. She is not troubled during the day. He has not noted any shortness of breath or chest pain. Her appetite has been fine, no nausea, emesis, diarrhea or constipation issues. She has appreciated a small amount of bright red blood upon passing her stool over the last couple of days there is a bit of discomfort associated with it. She has not had a baseline colonoscopy performed. She denies any frank hematochezia, no episodes of melena. She was seen by Dr. Ezzard Standing on 03/01/2012, at which point an ultrasound was performed in the office which was consistent with a seroma. She is due to see Dr. Ezzard Standing in followup in late June of 2013. Emotionally she is a bit fragile, she currently does not know where her job status is i.e. whether she'll have a job in the next couple of weeks are not. She and her husband are considering the possibility of relocating back to [place].  A detailed review of systems is otherwise noncontributory as noted below.  Review of Systems: Constitutional:  Stressed due to issues associated with her job and insurance  status. Eyes: She has noticed that her distant vision is getting a bit blurry. Last eye exam was over 1 year ago. ENT: No complaints Cardiovascular: no chest pain or dyspnea on exertion Respiratory: no cough, shortness of breath, or wheezing Neurological: no TIA or stroke symptoms Dermatological: negative Gastrointestinal: no abdominal pain, change in bowel habits, or black or bloody stools Genito-Urinary: no dysuria, trouble voiding, or hematuria Hematological and Lymphatic: negative Breast: positive for - seroma at the left lumpectomy site. Musculoskeletal: negative Remaining ROS negative.  Medications:   I have reviewed the patient's current medications.  Current Outpatient Prescriptions  Medication Sig Dispense Refill  . Ascorbic Acid (VITAMIN C) 500 MG CAPS Take 500 mg by mouth 2 (two) times daily. 12/13/11- Pt states she does not take this every day, just as needed.      Marland Kitchen CALCIUM & MAGNESIUM CARBONATES PO Take 1 tablet by mouth 2 (two) times daily. 400 mg calcium and 150 mg magnesium      . cholecalciferol (VITAMIN D) 1000 UNITS tablet Take 1,000 Units by mouth 2 (two) times daily.       . Omega-3 Fatty Acids (FISH OIL) 500 MG CAPS Take 500 mg by mouth 2 (two) times daily.        . tamoxifen (NOLVADEX) 20 MG tablet Take 20 mg by mouth daily.      . Vitamin E 200 UNITS TABS Take 200 mg by mouth 2 (two) times daily.  Allergies:  Allergies  Allergen Reactions  . Erythromycin Diarrhea and Nausea Only    Physical Exam: Filed Vitals:   03/22/12 0918  BP: 142/81  Pulse: 109  Temp: 98.5 F (36.9 C)    Body mass index is 21.21 kg/(m^2). Weight: 135 lbs. HEENT:  Sclerae anicteric, conjunctivae pink.  Oropharynx clear.  No mucositis or candidiasis.   Nodes:  No cervical, supraclavicular, or axillary lymphadenopathy palpated.  Breast Exam:  The left breast was examined, a large seroma is appreciated under the prior left upper quadrant lumpectomy site. There is no evidence  of axillary fullness or lymphadenopathy. The remaining breast tissue is free of any dominant mass effect or nipple inversion or discharge. The right breast was examined, is free of masses skin changes nipple inversion or discharge. Lungs:  Clear to auscultation bilaterally.  No crackles, rhonchi, or wheezes.   Heart:  Regular rate and rhythm.   Abdomen:  Soft, nontender.  Positive bowel sounds.  No organomegaly or masses palpated.   Musculoskeletal:  No focal spinal tenderness to palpation.  Extremities:  Benign.  No peripheral edema or cyanosis.   Skin:  Benign.   Neuro:  Nonfocal, alert and oriented x 3.   Lab Results: Lab Results  Component Value Date   WBC 3.3* 03/12/2012   HGB 12.9 03/12/2012   HCT 37.2 03/12/2012   MCV 90.4 03/12/2012   PLT 194 03/12/2012   NEUTROABS 2.3 03/12/2012     Chemistry      Component Value Date/Time   NA 141 03/12/2012 0909   K 4.2 03/12/2012 0909   CL 107 03/12/2012 0909   CO2 26 03/12/2012 0909   BUN 13 03/12/2012 0909   CREATININE 0.77 03/12/2012 0909      Component Value Date/Time   CALCIUM 9.3 03/12/2012 0909   ALKPHOS 45 03/12/2012 0909   AST 13 03/12/2012 0909   ALT 11 03/12/2012 0909   BILITOT 0.6 03/12/2012 0909      No results found for this basename: LABCA2     Assessment:  Isabel [name] is a 56 year old [place] woman with a 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 The DCIS component measured 1.5 cm, radiation was completed December of 2012. She is currently on tamoxifen 20 mg by mouth daily.  2. 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  3. Reported episode of bright red blood per rectum suspect anal fissure.  Case has been reviewed with Dr. Pierce Crane.  Plan:  [name] will continue on tamoxifen 20 mg per day. A refill prescription for a 90 day supply was provided. I have also provided her cards of our financial counselors in the event her  job status changes. Pertained to imaging of the left breast, she will be 6 months out from completion of radiation in June, we will defer whether to proceed with mammogram at that point to Dr. Ezzard Standing.  Pertaining to the episode of blood passed following recent bowel movements, I have recommended that she followup with her primary care in this regards but reassured her that the symptoms she described is more consistent with an anal fissure that should heal on its own, but have recommended that she try to keep her stools on the softer site. We will plan on seeing [place] back at a 4 month interval, but she knows that we would be happy to see her sooner if the need should  arise, and of course if she is eating to relocate, to notify us of the area she's having to, at which point we may have some insight as to getting her established with a local oncologist.  This plan was reviewed with the patient, who voices understanding and agreement.  She knows to call with any changes or problems.    Breonna Gafford T, PA-C 03/22/2012

## 2012-06-01 ENCOUNTER — Ambulatory Visit (INDEPENDENT_AMBULATORY_CARE_PROVIDER_SITE_OTHER): Payer: 59 | Admitting: Surgery

## 2012-06-01 ENCOUNTER — Encounter (INDEPENDENT_AMBULATORY_CARE_PROVIDER_SITE_OTHER): Payer: Self-pay | Admitting: Surgery

## 2012-06-01 VITALS — BP 118/82 | HR 78 | Temp 96.9°F | Resp 18 | Ht 67.0 in | Wt 135.5 lb

## 2012-06-01 DIAGNOSIS — D0510 Intraductal carcinoma in situ of unspecified breast: Secondary | ICD-10-CM

## 2012-06-01 DIAGNOSIS — D0590 Unspecified type of carcinoma in situ of unspecified breast: Secondary | ICD-10-CM

## 2012-06-01 NOTE — Progress Notes (Signed)
Name:  Isabel [name] MRN:  [id number]  MDBC   ASSESSMENT AND PLAN: 1.  DCIS left breast, UOQ, 2 o'clock.   ER/PR positive.   The patient had a lumpectomy (no SLNBx) on 08/25/2011.   She had a 1.5 cm DCIS, deep margin at 0.1 cm.  She completed rad tx in Dec 2012.  She is now on Tamoxifen.  Disease free, though noticeable lump at lumpectomy site.  To see me back in 3 months, which is her at her one year mark.  2. Mother has Paget's disease.  She was [place] with [place] central lumpect[place] in [place]  3. Heart murmur, MVP. Has seen Dr. Becky Augusta.  4. Bilateral hip osteoarthritis.  (This may limit aromatase inhibitors.)  Sees Dr. Rudene Re at [facility].  5. Vaginal discharge/bleeding.  Still having some of this.  Has been seen by Dr. Jorene Minors.  She is unsure of her next appt.   6.  Has approx 5 to 6 cm mass at biopsy site (UOQ left breast) - seroma/hematoma/debris in biopsy site.  I will see her back in 3 months at her annual visit to check biopsy site.  US done and photos taken.  I hope this will improve (resolve) with in 2 years of the surgery, but it is staying persistent.  We talked briefly about surgery to clean it up, but at this time we will watch it.  HISTORY OF PRESENT ILLNESS: Isabel [name] is a 56 y.o. (DOB: February 06, 1956)  white female who is a patient of Dr. Juline Patch and comes to me today for follow up of left breast cancer.  The patient had a left breast lumpectomy on 08/25/2011.  This tumor was towards the left axillary tail of Spence and was against the left pectoralis major muscle.  She has a mass at the biopsy site (as she said, she had a "lumpectomy" and she still has a "lump").    She still has a lump in the UOQ of the left breast.  The mass may be a little better than when I saw her 3 months ago, but it is not much better.  She also notices that her left breast skin is "redder" than the right.  I tried to reassure that this should get better with  time.  She is still having some vaginal bleeding/discharge.  This is actually better than before she was put on Tamoxifen.  We talked about follow up with Drs. Donnie Coffin and [employer].  Her mother's has Paget's disease.  And she has an aunt (her mother's sister), who has DCIS.  Path (side, TNM): DCIS  Surgery: Left breast lumpectomy   Date: 20 Sept 2012 Size of tumor: 1.5 cm (microcalcifications)  Nodes: -/-  ER: 97% PR: 100% Ki67: - HER2Neu: -  Medical Oncologist: Donnie Coffin    Radiation Oncologist: Michell Heinrich   PHYSICAL EXAM: There were no vitals taken for this visit.  HEENT:  Pupils equal.  Dentition good.  No injury. NECK:  Supple.  No thyroid mass. LYMPH NODES:  No cervical, supraclavicular, or axillary adenopathy. BREASTS -  RIGHT:  No palpable mass or nodule.  No nipple discharge.   LEFT:  Left breast incision in UOQ is well healed.  She has a 5 to 6 cm mass at the biopsy site.  US shows mixed echogenicity, c/w seroma/hematoma/debris in biopsy cavity. UPPER EXTREMITIES:  No evidence of lymphedema.  DATA REVIEWED: None new.

## 2012-07-16 ENCOUNTER — Encounter (INDEPENDENT_AMBULATORY_CARE_PROVIDER_SITE_OTHER): Payer: Self-pay

## 2012-07-23 ENCOUNTER — Ambulatory Visit (HOSPITAL_BASED_OUTPATIENT_CLINIC_OR_DEPARTMENT_OTHER): Payer: 59 | Admitting: Family

## 2012-07-23 ENCOUNTER — Other Ambulatory Visit (HOSPITAL_BASED_OUTPATIENT_CLINIC_OR_DEPARTMENT_OTHER): Payer: 59 | Admitting: Lab

## 2012-07-23 ENCOUNTER — Encounter: Payer: Self-pay | Admitting: Family

## 2012-07-23 ENCOUNTER — Telehealth: Payer: Self-pay | Admitting: *Deleted

## 2012-07-23 VITALS — BP 116/79 | HR 78 | Temp 97.8°F | Resp 18 | Ht 67.0 in | Wt 138.1 lb

## 2012-07-23 DIAGNOSIS — D0590 Unspecified type of carcinoma in situ of unspecified breast: Secondary | ICD-10-CM

## 2012-07-23 DIAGNOSIS — Z170 Estrogen receptor positive status [ER+]: Secondary | ICD-10-CM

## 2012-07-23 DIAGNOSIS — D0510 Intraductal carcinoma in situ of unspecified breast: Secondary | ICD-10-CM

## 2012-07-23 DIAGNOSIS — IMO0002 Reserved for concepts with insufficient information to code with codable children: Secondary | ICD-10-CM

## 2012-07-23 LAB — CBC WITH DIFFERENTIAL/PLATELET
BASO%: 1.4 % (ref 0.0–2.0)
Eosinophils Absolute: 0.1 10*3/uL (ref 0.0–0.5)
LYMPH%: 21.4 % (ref 14.0–49.7)
MCHC: 34.5 g/dL (ref 31.5–36.0)
MCV: 90.5 fL (ref 79.5–101.0)
MONO%: 13.1 % (ref 0.0–14.0)
Platelets: 188 10*3/uL (ref 145–400)
RBC: 4.3 10*6/uL (ref 3.70–5.45)

## 2012-07-23 LAB — COMPREHENSIVE METABOLIC PANEL
Alkaline Phosphatase: 44 U/L (ref 39–117)
Glucose, Bld: 86 mg/dL (ref 70–99)
Sodium: 141 mEq/L (ref 135–145)
Total Bilirubin: 0.6 mg/dL (ref 0.3–1.2)
Total Protein: 6.4 g/dL (ref 6.0–8.3)

## 2012-07-23 NOTE — Patient Instructions (Addendum)
Return in 6 months with lab prior.

## 2012-07-23 NOTE — Telephone Encounter (Signed)
Left patient voice message inform her of the six month appointment with the lab one week before the md appointment

## 2012-07-23 NOTE — Progress Notes (Signed)
Hematology and Oncology Follow Up Visit  Isabel [name] [id number] 1956-11-07 56 y.o. 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 The DCIS component measured 1.5 cm, radiation was completed December of 2012. She is currently on tamoxifen 20 mg by mouth daily.  2. 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 Continues on tamoxifen 20 mg by mouth daily, which actually is tolerating quite well. She denies any fevers, chills, or night sweats, though she does state occasionally she wakes up "warm".She is not troubled during the day. No shortness of breath or chest pain. Appetite is good, no nausea, emesis, diarrhea or constipation issues. Was seen by Dr. Ezzard Standing end of June, he did ultrasound of the left breast in the office which showed seroma/hematoma. Takes Vitamin D, 5000iu daily per Dr. Ricki Miller. Mammo is due September, she is contemplating changing to [employer] from [employer] due to a billing error at [employer]. A detailed review of systems is otherwise noncontributory.  Medications:   I have reviewed the patient's current medications.  Allergies:  Allergies  Allergen Reactions  . Erythromycin Diarrhea and Nausea Only   Physical Exam: Filed Vitals:   07/23/12 0853  BP: 116/79  Pulse: 78  Temp: 97.8 F (36.6 C)  Resp: 18    Body mass index is 21.63 kg/(m^2). Weight: 135 lbs. HEENT:  Sclerae anicteric, conjunctivae pink.  Oropharynx clear.  No mucositis or candidiasis.   Nodes:  No cervical, supraclavicular, or axillary lymphadenopathy palpated.  Lungs:  Clear to auscultation bilaterally.  No crackles, rhonchi, or wheezes.   Heart:  Regular rate and rhythm.   Abdomen:  Soft, nontender.   Positive bowel sounds.  No organomegaly or masses palpated.   Musculoskeletal:  No focal spinal tenderness to palpation.  Extremities:  Benign.  No peripheral edema or cyanosis.   Skin:  Benign.   Neuro:  Nonfocal, alert and oriented x 3. BREAST EXAM: In the supine position, with the right arm over the head, the right nipple is everted. No periareolar edema or nipple discharge. No mass in any quadrant or subareolar region. No redness of the skin. No right axillary adenopathy. With the left arm over the head, the left nipple is everted. No periareolar edema or nipple discharge. The entire upper outer quadrant is mass-like effect of seroma, measuring 9 x 7.5 cm. No other mass in any quadrant or subareolar region. Mild redness of the skin, owing to radiation changes. No left axillary adenopathy.   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 The DCIS component measured 1.5 cm, radiation was completed December of 2012. She is currently on tamoxifen 20 mg by mouth daily with good tolerance.  2. 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   Plan:  1. Continue Tamoxifen.  2. Mammogram in Sept. 2013.  3. Vit D and bone density per Dr. Ricki Miller.  4. Seroma surveillance per Dr. Ezzard Standing.   This plan was reviewed with the patient, who voices understanding and agreement.  She knows to call with any changes or problems.    Colman Cater, FNP-C 07/23/2012

## 2012-07-24 ENCOUNTER — Other Ambulatory Visit: Payer: Self-pay | Admitting: *Deleted

## 2012-07-24 ENCOUNTER — Telehealth: Payer: Self-pay | Admitting: *Deleted

## 2012-07-24 DIAGNOSIS — D0510 Intraductal carcinoma in situ of unspecified breast: Secondary | ICD-10-CM

## 2012-07-24 DIAGNOSIS — C50419 Malignant neoplasm of upper-outer quadrant of unspecified female breast: Secondary | ICD-10-CM

## 2012-07-24 NOTE — Telephone Encounter (Signed)
Made patient appointment for mammogram at [facility] on 08-27-2012 at 1:00pm

## 2012-07-31 ENCOUNTER — Encounter: Payer: Self-pay | Admitting: Internal Medicine

## 2012-07-31 ENCOUNTER — Encounter: Payer: Self-pay | Admitting: *Deleted

## 2012-08-23 ENCOUNTER — Encounter: Payer: Self-pay | Admitting: Cardiovascular Disease

## 2012-08-31 ENCOUNTER — Encounter (INDEPENDENT_AMBULATORY_CARE_PROVIDER_SITE_OTHER): Payer: Self-pay | Admitting: Surgery

## 2012-08-31 ENCOUNTER — Ambulatory Visit (INDEPENDENT_AMBULATORY_CARE_PROVIDER_SITE_OTHER): Payer: 59 | Admitting: Surgery

## 2012-08-31 ENCOUNTER — Ambulatory Visit (AMBULATORY_SURGERY_CENTER): Payer: 59 | Admitting: *Deleted

## 2012-08-31 ENCOUNTER — Encounter: Payer: Self-pay | Admitting: Internal Medicine

## 2012-08-31 VITALS — Ht 67.0 in | Wt 136.2 lb

## 2012-08-31 VITALS — BP 114/70 | HR 68 | Temp 98.1°F | Resp 16 | Ht 67.0 in | Wt 135.2 lb

## 2012-08-31 DIAGNOSIS — D0510 Intraductal carcinoma in situ of unspecified breast: Secondary | ICD-10-CM

## 2012-08-31 DIAGNOSIS — Z1211 Encounter for screening for malignant neoplasm of colon: Secondary | ICD-10-CM

## 2012-08-31 DIAGNOSIS — D0590 Unspecified type of carcinoma in situ of unspecified breast: Secondary | ICD-10-CM

## 2012-08-31 MED ORDER — MOVIPREP 100 G PO SOLR: ORAL | Status: DC

## 2012-08-31 NOTE — Progress Notes (Signed)
Name:  Isabel [name] MRN:  [id number]  MDBC   ASSESSMENT AND PLAN: 1.  DCIS left breast, UOQ, 2 o'clock.   ER/PR positive.   The patient had a lumpectomy (no SLNBx) on 08/25/2011.   She had a 1.5 cm DCIS, deep margin at 0.1 cm.  She completed rad tx in Dec 2012 by Dr. Michell Heinrich.  She is now on Tamoxifen.  Disease free, though noticeable lump at lumpectomy site.  To see me back in 6 months.  2. Mother has Paget's disease.  She was [place] with a central lumpect[place] in [place]  3. Heart murmur, MVP. Has seen Dr. Becky Augusta.  4. Bilateral hip osteoarthritis.  (This may limit aromatase inhibitors.)  Sees Dr. Rudene Re at [facility].  5. Vaginal discharge/bleeding.  Still having some of this.  Has been seen by Dr. Jorene Minors.  She is unsure of her next appt.   6.  Has approx 5 to 6 cm mass at biopsy site (UOQ left breast) - seroma/hematoma/debris in biopsy site.  I will see her back in 3 months at her annual visit to check biopsy site.  US done and photos taken.  I hope this will improve (resolve) with in 2 years of the surgery, but it is staying persistent.  We talked briefly about surgery to clean it up, but at this time we will watch it.  HISTORY OF PRESENT ILLNESS: Isabel [name] is a 56 y.o. (DOB: 1956-08-11)  white female who is a patient of Dr. Juline Patch and comes to me today for follow up of left breast cancer.  She is doing well.  She's had no period the last 2 months, so her vaginal bleeding is better.  She thinks the mass in her left breast biopsy mass may be a little smaller, but not much.  We talked about watching this for up to 2 years and we talked about possible surgery to excise it. She just had mammograms which were negative.  History of breast cancer: The patient had a left breast lumpectomy on 08/25/2011.  This tumor was towards the left axillary tail of Spence and was against the left pectoralis major muscle.  She has a mass at the biopsy site (as she  said, she had a "lumpectomy" and she still has a "lump").    She still has a lump in the UOQ of the left breast.  The mass may be a little better than when I saw her 3 months ago, but it is not much better.  She also notices that her left breast skin is "redder" than the right.  I tried to reassure that this should get better with time.  She is still having some vaginal bleeding/discharge.  This is actually better than before she was put on Tamoxifen.  We talked about follow up with Drs. Donnie Coffin and [employer].  Her mother's has Paget's disease.  And she has an aunt (her mother's sister), who has DCIS.  Path (side, TNM): DCIS  Surgery: Left breast lumpectomy   Date: 20 Sept 2012 Size of tumor: 1.5 cm (microcalcifications)  Nodes: -/-  ER: 97% PR: 100% Ki67: - HER2Neu: -  Medical Oncologist: Donnie Coffin    Radiation Oncologist: Michell Heinrich   PHYSICAL EXAM: BP 114/70  Pulse 68  Temp 98.1 F (36.7 C) (Temporal)  Resp 16  Ht 5\' 7"  (1.702 m)  Wt 135 lb 3.2 oz (61.326 kg)  BMI 21.18 kg/m2  LMP 06/04/2012  HEENT:  Pupils equal.  Dentition good.  No injury. NECK:  Supple.  No thyroid mass. LYMPH NODES:  No cervical, supraclavicular, or axillary adenopathy. BREASTS -  RIGHT:  No palpable mass or nodule.  No nipple discharge.   LEFT:  Left breast incision in UOQ is well healed.  She has a 5 to 6 cm mass at the biopsy site.  UPPER EXTREMITIES:  No evidence of lymphedema.  Ultrasound of left breast:  Repeat US shows a complex cystic/solid mass consistent with prior biopsy site.  It has smooth walls, but has mixed internal echogenicity.  This is c/w seroma/hematoma/debris in biopsy cavity.  [Photos taken]  DATA REVIEWED: [facility] - 08/21/2012 - neg except post op changes.

## 2012-09-11 ENCOUNTER — Telehealth: Payer: Self-pay | Admitting: Internal Medicine

## 2012-09-11 NOTE — Telephone Encounter (Signed)
Patient had both hips replaced and she has to pre medicate for dental procedures. She just wants to be sure she does not need pre medication prior to colonoscopy on Friday. Please, advise.

## 2012-09-11 NOTE — Telephone Encounter (Signed)
According to the guidelines she will not need antibiotics prior to the colonoscopy

## 2012-09-12 NOTE — Telephone Encounter (Signed)
Spoke with patient and she states she called her orthopedic MD at [facility] and was told to take a premedication antibiotic. She was told to take it one hour prior to procedure. Explained to patient she is to have nothing to eat or drink 3 hours prior to procedure. (confirmed with Darcey Nora, RN) She will take it 3 hours prior.

## 2012-09-12 NOTE — Telephone Encounter (Signed)
Left a message for patient to call me. 

## 2012-09-14 ENCOUNTER — Encounter: Payer: Self-pay | Admitting: Internal Medicine

## 2012-09-14 ENCOUNTER — Ambulatory Visit (AMBULATORY_SURGERY_CENTER): Payer: 59 | Admitting: Internal Medicine

## 2012-09-14 VITALS — BP 107/60 | HR 58 | Temp 98.0°F | Resp 23 | Ht 67.0 in | Wt 136.0 lb

## 2012-09-14 DIAGNOSIS — Z1211 Encounter for screening for malignant neoplasm of colon: Secondary | ICD-10-CM

## 2012-09-14 MED ORDER — SODIUM CHLORIDE 0.9 % IV SOLN: 500.0000 mL | INTRAVENOUS | Status: DC

## 2012-09-14 NOTE — Op Note (Signed)
[facility] [address] [place], [address]   COLONOSCOPY PROCEDURE REPORT  PATIENT: Isabel [name], Isabel [name]  MR#: [id number] BIRTHDATE: 1956-04-14 , 56  yrs. old GENDER: Female ENDOSCOPIST: Hart Carwin, MD REFERRED BY:  Juline Patch, M.D. PROCEDURE DATE:  09/14/2012 PROCEDURE:   Colonoscopy, screening ASA CLASS:   Class I INDICATIONS:average risk patient for colon cancer. MEDICATIONS: MAC sedation, administered by CRNA and Propofol (Diprivan) 300 mg IV  DESCRIPTION OF PROCEDURE:   After the risks and benefits and of the procedure were explained, informed consent was obtained.  A digital rectal exam revealed no abnormalities of the rectum.    The LB [id number]  endoscope was introduced through the anus and advanced to the cecum, which was identified by both the appendix and ileocecal valve .  The quality of the prep was good, using MoviPrep .  The instrument was then slowly withdrawn as the colon was fully examined.     COLON FINDINGS: A normal appearing cecum, ileocecal valve, and appendiceal orifice were identified.  The ascending, hepatic flexure, transverse, splenic flexure, descending, sigmoid colon and rectum appeared unremarkable.  No polyps or cancers were seen. The scope was then withdrawn from the patient and the procedure completed.  COMPLICATIONS: There were no complications. ENDOSCOPIC IMPRESSION: Normal colon  RECOMMENDATIONS: High fiber diet   REPEAT EXAM: In 10 year(s)  for Colonoscopy.  cc:  _______________________________ eSignedHart Carwin, MD 09/14/2012 8:36 AM

## 2012-09-14 NOTE — Progress Notes (Signed)
Patient did not experience any of the following events: a burn prior to discharge; a fall within the facility; wrong site/side/patient/procedure/implant event; or a hospital transfer or hospital admission upon discharge from the facility. (G8907) Patient did not have preoperative order for IV antibiotic SSI prophylaxis. (G8918)  

## 2012-09-14 NOTE — Patient Instructions (Addendum)

## 2012-09-17 ENCOUNTER — Telehealth: Payer: Self-pay

## 2012-09-17 NOTE — Telephone Encounter (Signed)
  Follow up Call-  Call back number 09/14/2012  Post procedure Call Back phone  # [phone]  Permission to leave phone message Yes     Patient questions:  Do you have a fever, pain , or abdominal swelling? no Pain Score  0 *  Have you tolerated food without any problems? yes  Have you been able to return to your normal activities? yes  Do you have any questions about your discharge instructions: Diet   no Medications  no Follow up visit  no  Do you have questions or concerns about your Care? yes  Still having loose stools, eating and drinking adequately.  Advised to call us again if worsens or doesn't improve in 24 hours.  Feels fine otherwise, no fever.  Actions: * If pain score is 4 or above: No action needed, pain <4.

## 2012-10-24 IMAGING — CR DG CHEST 2V
2 series · 2 of 2 positions shown · non-contrast
Comparison: None.

CLINICAL DATA: Preoperative respiratory examination for lumpectomy.

CHEST - 2 VIEW

[w chest pa]
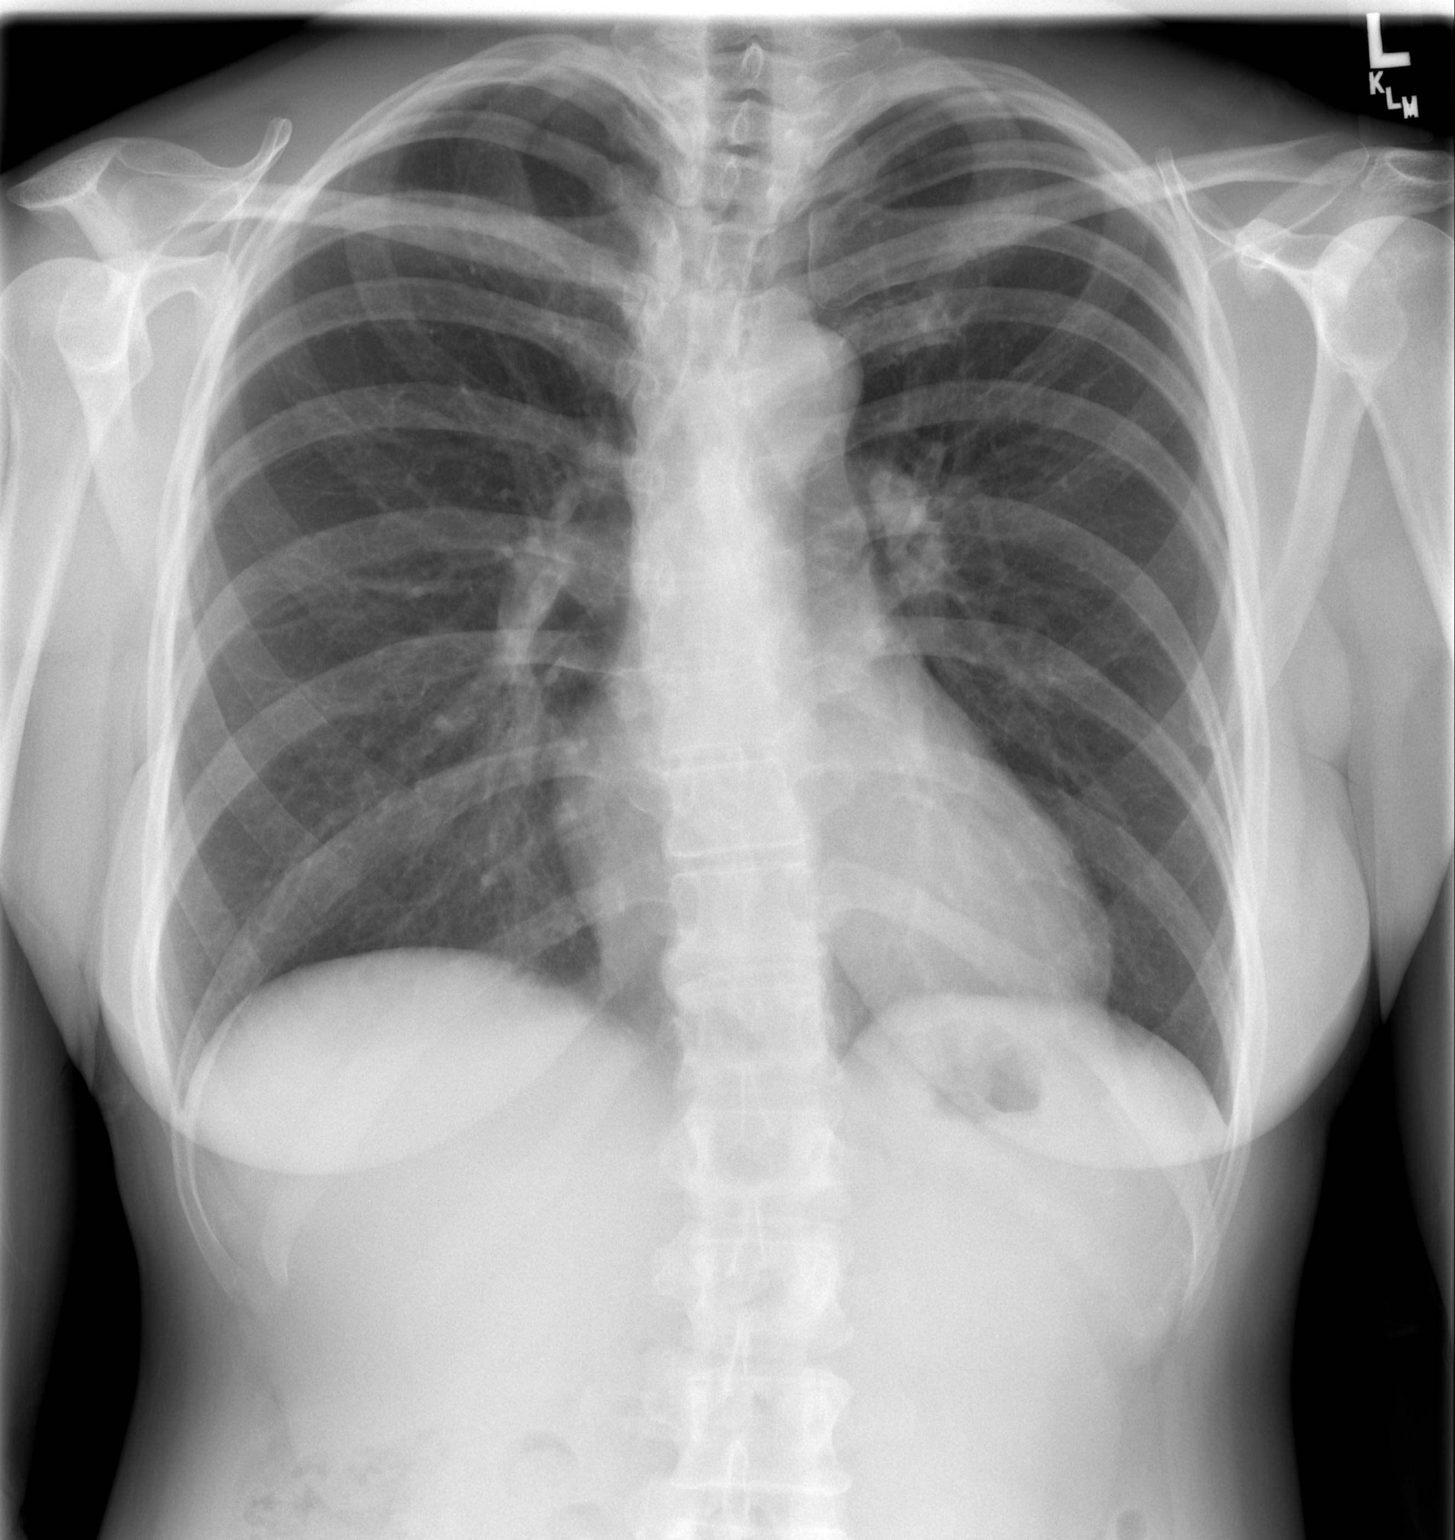

[w chest lat]
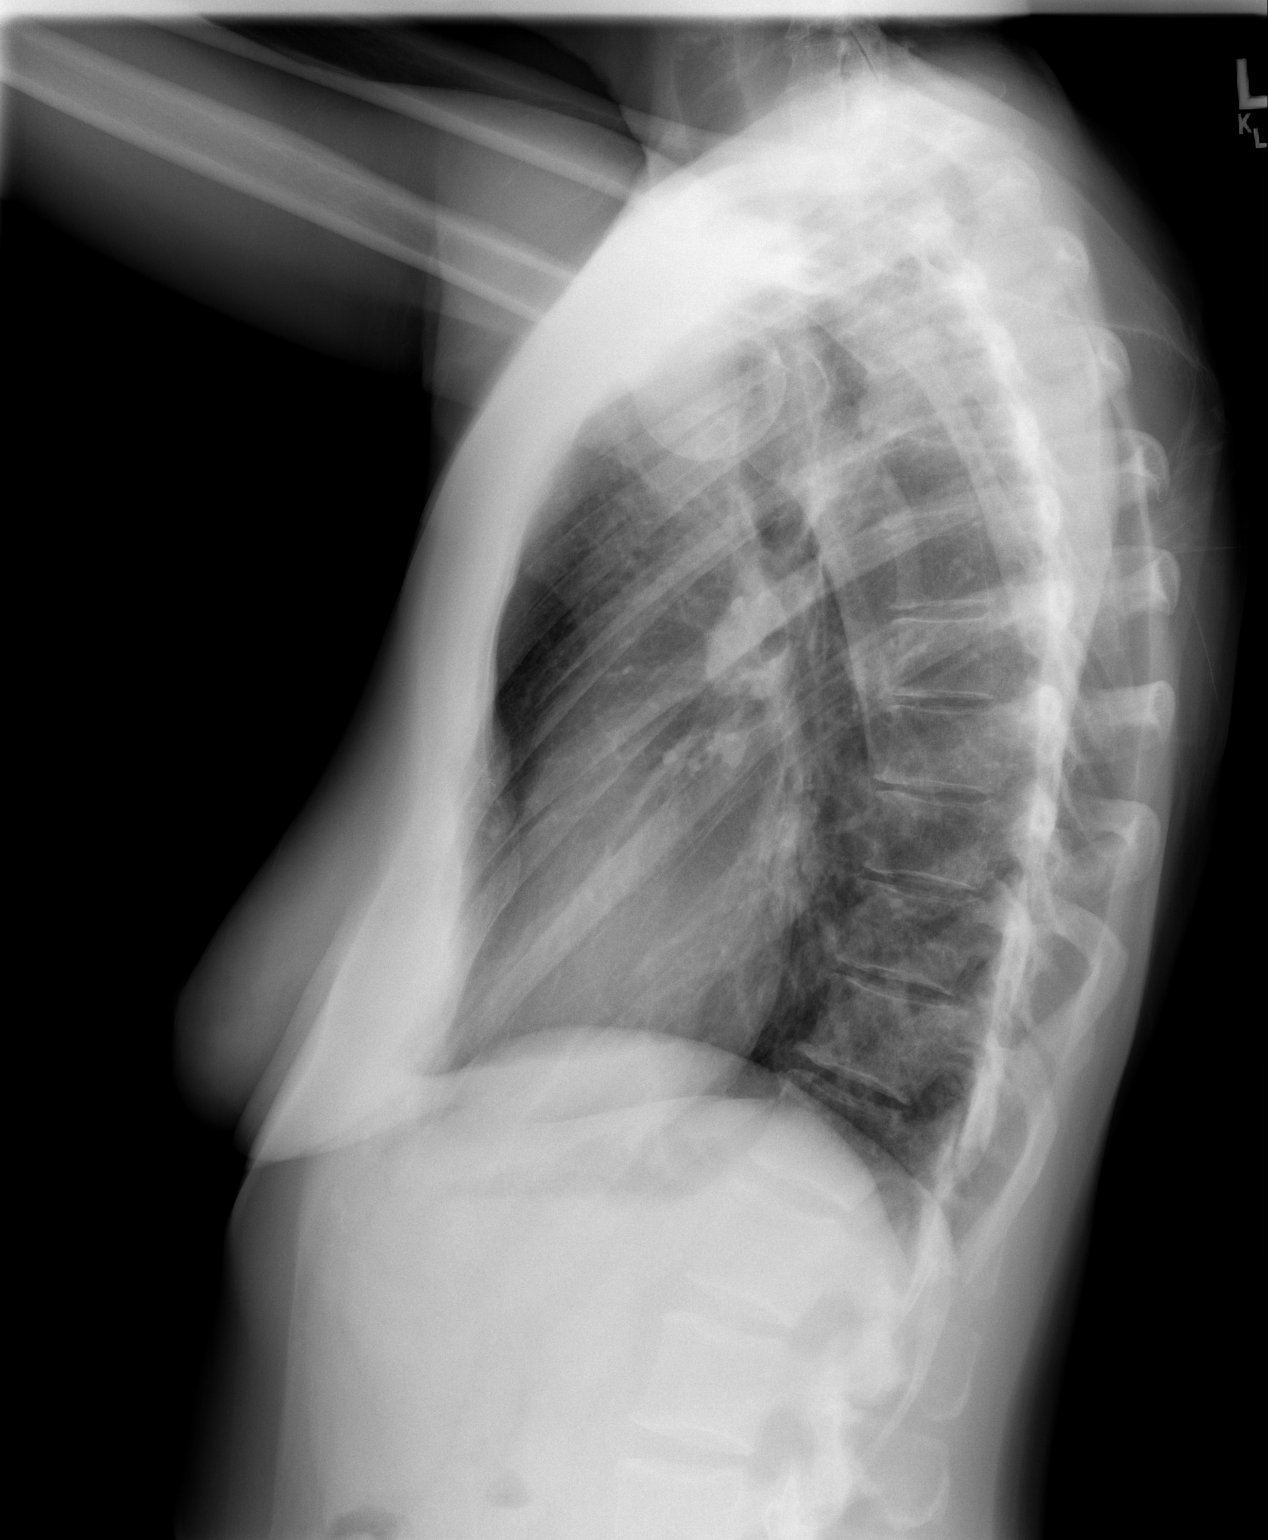

[2 of 2 positions shown; findings below may reference images not displayed]

FINDINGS: There is a probable mild pectus deformity of the sternum.
There is a mild thoracolumbar scoliosis.  The heart size and
mediastinal contours are normal.  The lungs are clear.  There is no
pleural effusion.  No acute or focal osseous findings are
demonstrated.
IMPRESSION: No active cardiopulmonary process or evidence of metastatic
disease.

## 2012-12-21 IMAGING — CT CT ANGIO CHEST
1 of 2 series · 19 of 32 positions shown · IV contrast (100 ML OMNI 300)
Comparison: Plain films of the chest 08/24/2011.

CLINICAL DATA: Chest pain and nausea.  History of left breast
cancer.  Status post lumpectomy August 2011.

CT ANGIOGRAPHY CHEST WITH CONTRAST
TECHNIQUE: Multidetector CT imaging of the chest was performed
using the standard protocol during bolus administration of
intravenous contrast.  Multiplanar CT image reconstructions
including MIPs were obtained to evaluate the vascular anatomy.
Contrast: 100mL OMNIPAQUE IOHEXOL 300 MG/ML IV SOLN

[Series 11: thins for pacs · axial · 0.74mm/px · z∈[+1586,+1786]mm · 19 of 224 slices shown]
[im 12/224  lung]
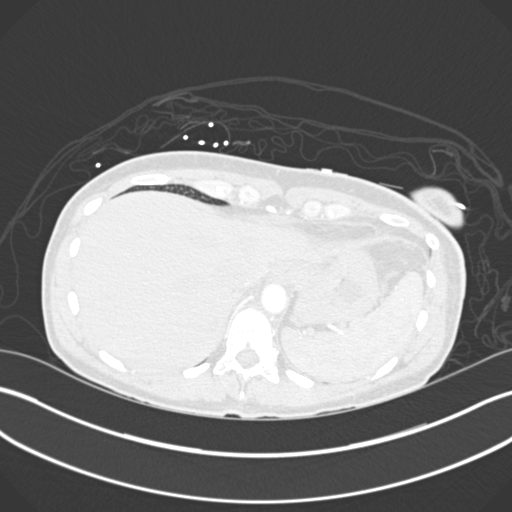
[im 23/224  mediastinal]
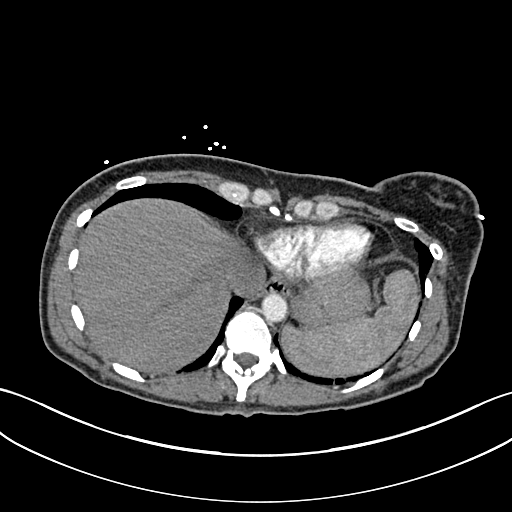
[im 34/224  lung]
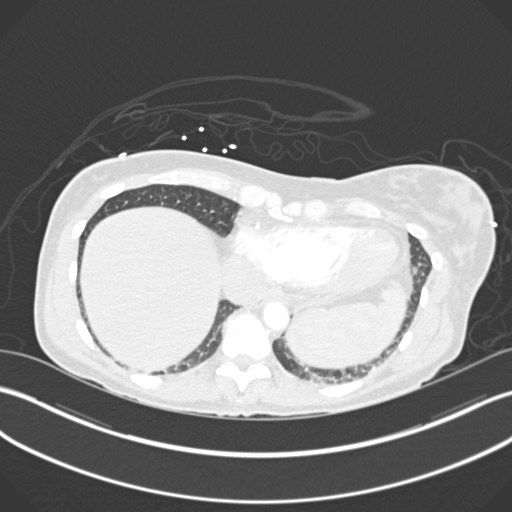
[im 56/224  mediastinal]
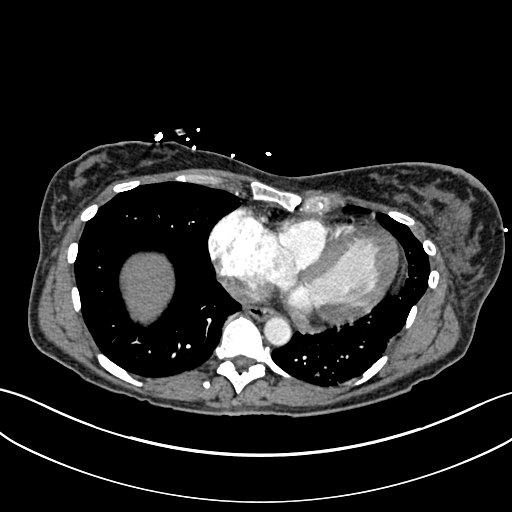
[im 67/224  lung]
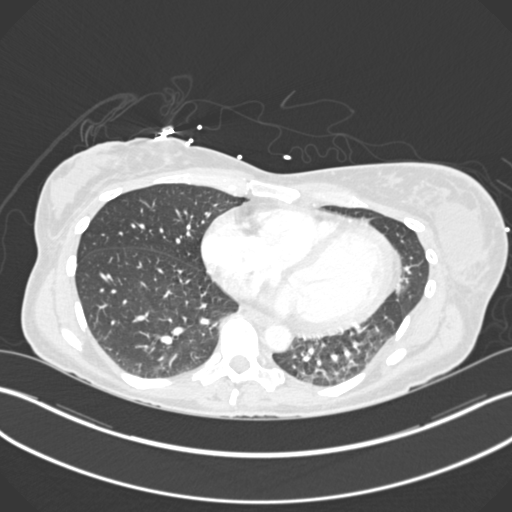
[im 75/224  mediastinal]
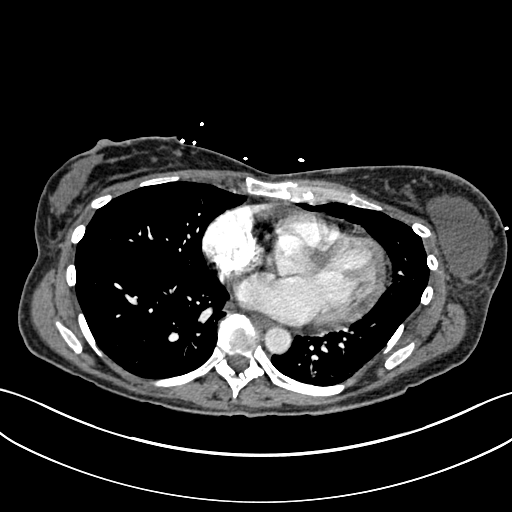
[im 79/224  lung]
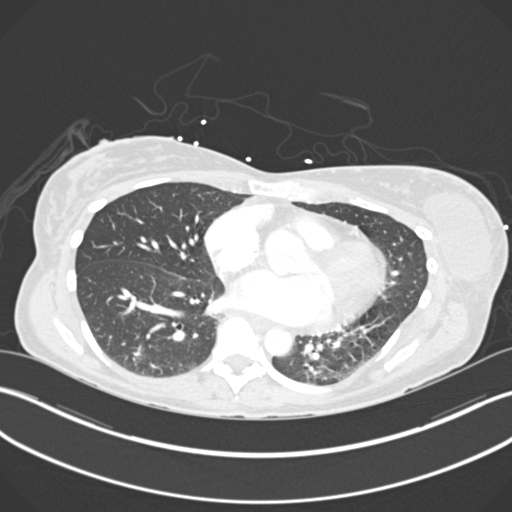
[im 90/224  mediastinal]
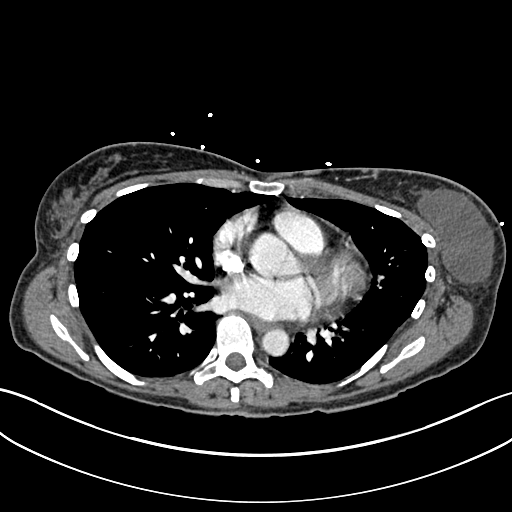
[im 101/224  lung]
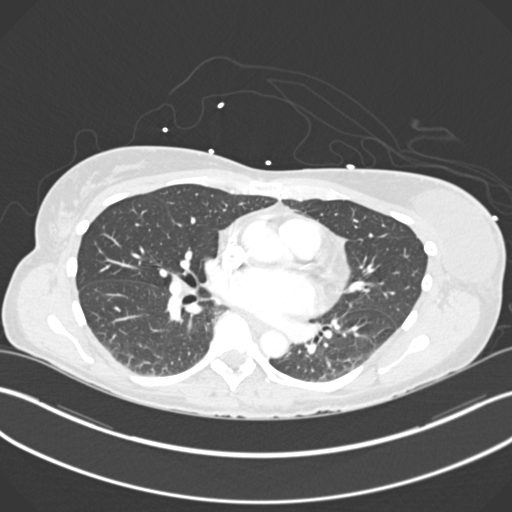
[im 112/224  mediastinal]
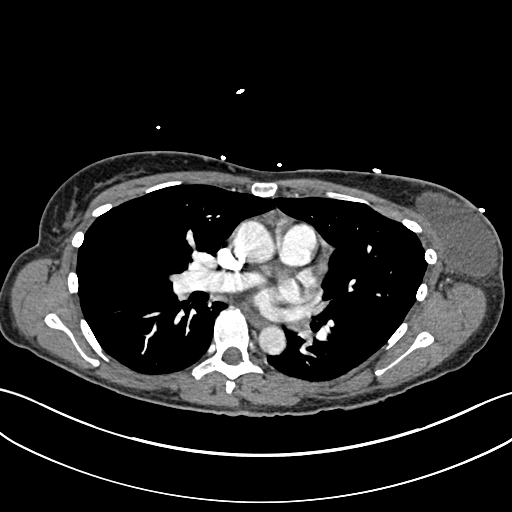
[im 123/224  lung]
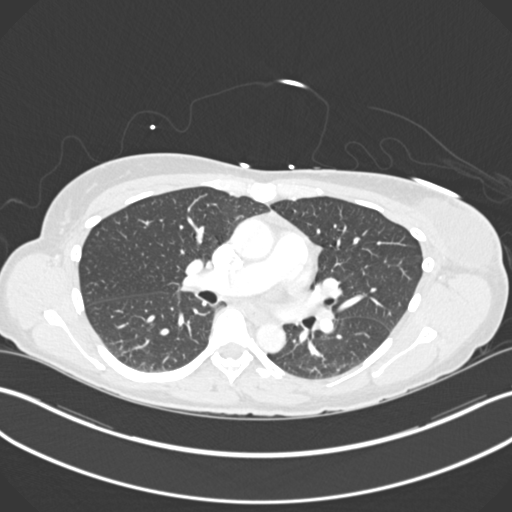
[im 134/224  mediastinal]
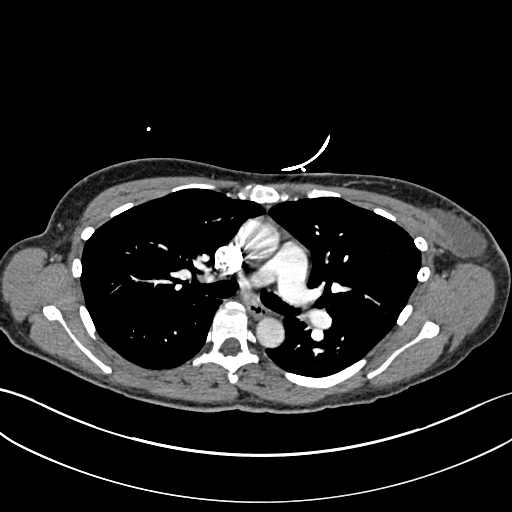
[im 145/224  lung]
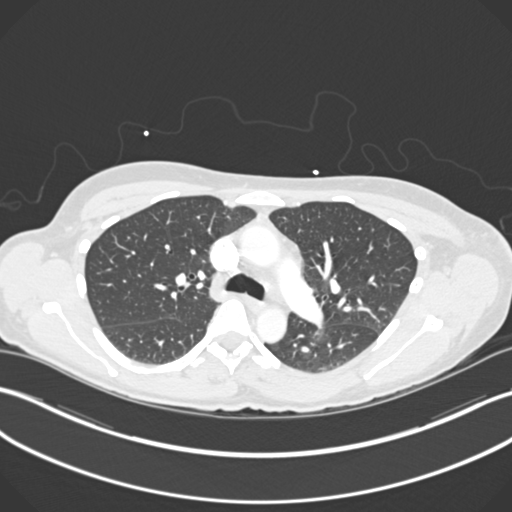
[im 149/224  mediastinal]
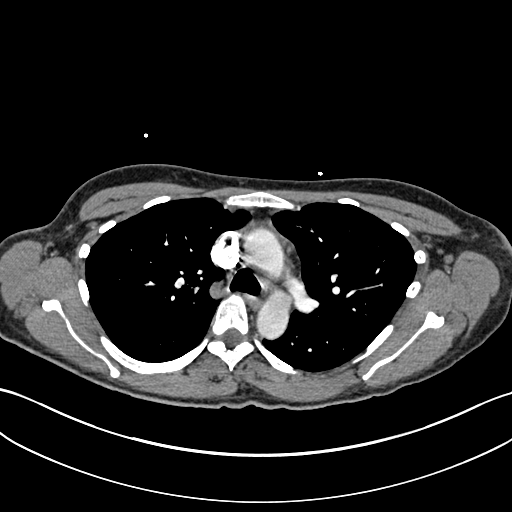
[im 157/224  lung]
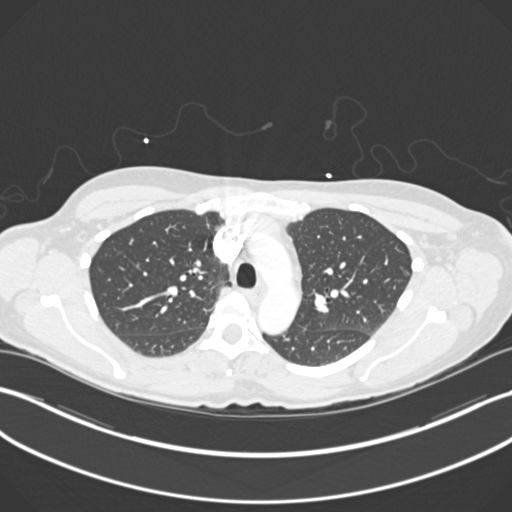
[im 168/224  mediastinal]
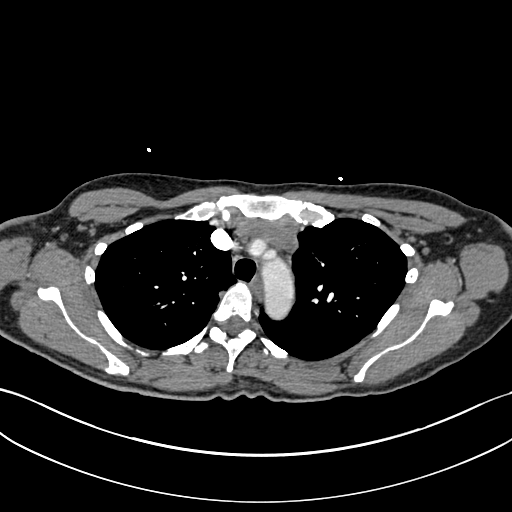
[im 190/224  lung]
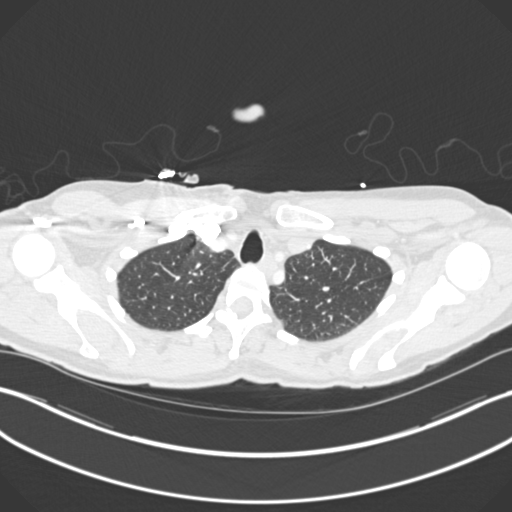
[im 201/224  mediastinal]
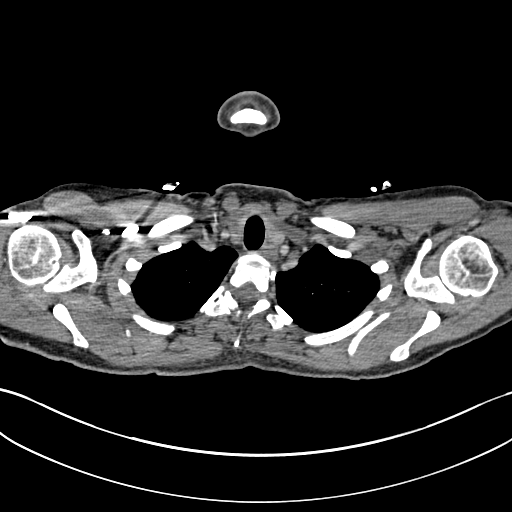
[im 212/224  lung]
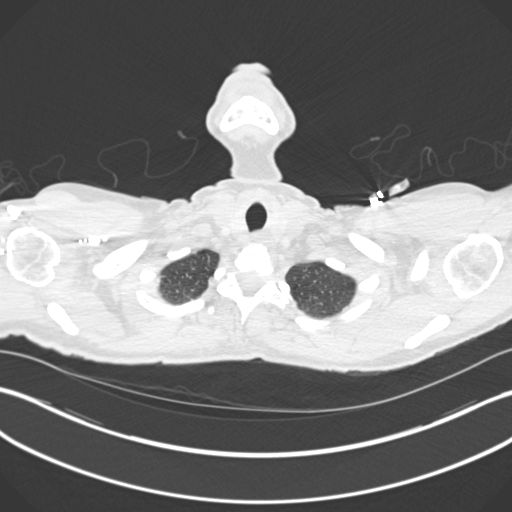

[19 of 32 positions shown; findings below may reference images not displayed]

FINDINGS: No pulmonary embolus is identified.   A fluid collection
is identified in the left breast without locules of gas or notable
rim enhancement most consistent with a postoperative seroma
measuring 8 cm AP by 3.5 cm transverse by 7.9 cm cranial-caudal.
No axillary, hilar or mediastinal lymphadenopathy.  Mild
cardiomegaly.  No pleural or pericardial effusion.  Lungs
demonstrate some dependent atelectasis.  No focal bony abnormality.

Review of the MIP images confirms the above findings.
IMPRESSION: 1.  Negative for pulmonary embolus or acute cardiopulmonary
disease.
2.  Fluid collection in the left breast has an appearance most
consistent with postoperative seroma.
3.  Mild cardiomegaly.

## 2013-01-03 ENCOUNTER — Telehealth: Payer: Self-pay | Admitting: *Deleted

## 2013-01-03 NOTE — Telephone Encounter (Signed)
Left message for a return call to reschedule patient's appt.  

## 2013-01-04 ENCOUNTER — Encounter: Payer: Self-pay | Admitting: Oncology

## 2013-01-04 ENCOUNTER — Telehealth: Payer: Self-pay | Admitting: *Deleted

## 2013-01-04 NOTE — Telephone Encounter (Signed)
Received return call from patient about rescheduling her appt.  Confirmed appt. For 01/25/13 at 145 with Amy Berry,PA.  Then will become Dr. Darnelle Catalan.

## 2013-01-11 ENCOUNTER — Telehealth: Payer: Self-pay | Admitting: *Deleted

## 2013-01-11 NOTE — Telephone Encounter (Signed)
Received call back from patient to reschedule her appt. Confirmed appt. For 02/08/13 at 3pm with Melissa Cross,NP.

## 2013-01-17 ENCOUNTER — Other Ambulatory Visit: Payer: 59 | Admitting: Lab

## 2013-01-24 ENCOUNTER — Ambulatory Visit: Payer: 59 | Admitting: Oncology

## 2013-01-25 ENCOUNTER — Ambulatory Visit: Payer: 59 | Admitting: Physician Assistant

## 2013-02-05 ENCOUNTER — Telehealth: Payer: Self-pay | Admitting: *Deleted

## 2013-02-05 NOTE — Telephone Encounter (Signed)
Left message for a return phone call to reschedule her appt. Due to Dr. Darnelle Catalan not here this week.

## 2013-02-07 ENCOUNTER — Telehealth: Payer: Self-pay | Admitting: *Deleted

## 2013-02-07 NOTE — Telephone Encounter (Signed)
Received call back from patient to reschedule her appt. Confirmed appt. For 02/22/13 at 1pm with [name].  Patient will change to [name].

## 2013-02-08 ENCOUNTER — Ambulatory Visit: Payer: 59 | Admitting: Gynecologic Oncology

## 2013-02-18 ENCOUNTER — Telehealth (INDEPENDENT_AMBULATORY_CARE_PROVIDER_SITE_OTHER): Payer: Self-pay

## 2013-02-18 NOTE — Telephone Encounter (Signed)
Pt aware of appt 04/03/13 @ 10:15 with Dr. Ezzard Standing

## 2013-02-22 ENCOUNTER — Ambulatory Visit: Payer: 59 | Admitting: Family

## 2013-02-22 ENCOUNTER — Telehealth: Payer: Self-pay | Admitting: *Deleted

## 2013-02-22 NOTE — Telephone Encounter (Signed)
Called pt to let her know that [name] went home sick and we needed to reschedule her appt.  Confirmed 03/01/13 appt w/ pt.

## 2013-03-01 ENCOUNTER — Telehealth: Payer: Self-pay

## 2013-03-01 ENCOUNTER — Ambulatory Visit (HOSPITAL_BASED_OUTPATIENT_CLINIC_OR_DEPARTMENT_OTHER): Payer: BC Managed Care – PPO | Admitting: Lab

## 2013-03-01 ENCOUNTER — Ambulatory Visit (HOSPITAL_BASED_OUTPATIENT_CLINIC_OR_DEPARTMENT_OTHER): Payer: BC Managed Care – PPO | Admitting: Family

## 2013-03-01 ENCOUNTER — Encounter: Payer: Self-pay | Admitting: Family

## 2013-03-01 ENCOUNTER — Telehealth: Payer: Self-pay | Admitting: Oncology

## 2013-03-01 VITALS — BP 112/72 | HR 82 | Temp 99.2°F | Resp 20 | Ht 67.0 in | Wt 136.7 lb

## 2013-03-01 DIAGNOSIS — D0590 Unspecified type of carcinoma in situ of unspecified breast: Secondary | ICD-10-CM

## 2013-03-01 DIAGNOSIS — D0512 Intraductal carcinoma in situ of left breast: Secondary | ICD-10-CM

## 2013-03-01 DIAGNOSIS — Z170 Estrogen receptor positive status [ER+]: Secondary | ICD-10-CM

## 2013-03-01 LAB — CBC WITH DIFFERENTIAL/PLATELET
Basophils Absolute: 0 10*3/uL (ref 0.0–0.1)
EOS%: 2.5 % (ref 0.0–7.0)
HGB: 13 g/dL (ref 11.6–15.9)
MCH: 29.9 pg (ref 25.1–34.0)
MONO%: 12.4 % (ref 0.0–14.0)
NEUT#: 2.3 10*3/uL (ref 1.5–6.5)
RBC: 4.35 10*6/uL (ref 3.70–5.45)
RDW: 13.1 % (ref 11.2–14.5)
lymph#: 0.9 10*3/uL (ref 0.9–3.3)

## 2013-03-01 LAB — COMPREHENSIVE METABOLIC PANEL (CC13)
Albumin: 3.6 g/dL (ref 3.5–5.0)
BUN: 15.4 mg/dL (ref 7.0–26.0)
CO2: 26 mEq/L (ref 22–29)
Calcium: 9.1 mg/dL (ref 8.4–10.4)
Chloride: 105 mEq/L (ref 98–107)
Creatinine: 0.7 mg/dL (ref 0.6–1.1)
Glucose: 101 mg/dl — ABNORMAL HIGH (ref 70–99)
Potassium: 4.4 mEq/L (ref 3.5–5.1)

## 2013-03-01 MED ORDER — TAMOXIFEN CITRATE 20 MG PO TABS: 20.0000 mg | ORAL_TABLET | Freq: Every day | ORAL | Status: DC

## 2013-03-01 NOTE — Telephone Encounter (Signed)
LVMOM regarding pts lab results from 3/28. Labs are ok but Vit D level not back yet. Will call her when results are back. Any questions, call the office. TMB

## 2013-03-01 NOTE — Patient Instructions (Addendum)
Please contact us at [phone] if you have any questions or concerns.  Exercise (low impact/water aerobics), medication, consume a healthy diet, drink plenty of water  Consider Peridin-C for hot flashes

## 2013-03-01 NOTE — Progress Notes (Signed)
[facility]  Telephone:([phone] [phone]  OFFICE PROGRESS NOTE  PATIENT: Isabel [name]   DOB: February 09, 1956  MR#: [id number]  JYN#:[id number]   QM:VHQI,ONGEXBM, MD Olivia Mackie, MD Sandria Bales. Ezzard Standing, MD Lurline Hare, MD  DIAGNOSIS:  A 57 year old [place], [place] woman with left breast ER positive DCIS diagnosed in 07/2011.   PRIOR THERAPY: 1. Status post abnormal screening mammogram on 07/30/2011 which showed a potential abnormality detected in the left breast, which required further evaluation. New, indeterminate calcifications were present in the upper outer quadrant. No abnormalities were observed in the right breast.  2. Status post abnormal diagnostic mammogram and ultrasound on 08/02/2011 which showed a cluster of pleomorphic calcifications in the upper outer quadrant of the left breast measuring 2.3 x 1.0 cm noted on magnification views. Stereotactic left breast biopsy recommended.  3. Status post left breast needle core biopsy on 08/09/2011 which showed a high-grade ductal carcinoma in situ, with comedonecrosis and calcification, ER +97%, PR +100%.  4. Status post bilateral breast MRI on 08/15/2011 which showed a 1.1 cm of enhancement far posteriorly in the upper outer quadrant of the left breast corresponding with the known DCIS.  5. Status post left breast lumpectomy on 08/25/2011 for a stage 0, pTis, PNX, for a high-grade DCIS with calcifications and comedo necrosis. No lymph nodes were examined, margins were not involved.  6. Status post radiation therapy from this 10/13/2011 through 11/30/2011.  7. Antiestrogen therapy with Tamoxifen since 12/2011.   CURRENT THERAPY: Annual bilateral digital diagnostic mammography and antiestrogen therapy with Tamoxifen.   INTERVAL HISTORY: [name] and I saw Isabel [name] today for follow up of left breast DCIS. Since her last office visit with Colman Cater, FNP-BC on 07/23/2012, the patient  states that she experiences ongoing hot flashes and night sweats (that have improved) and intermittent swelling of her right ankle that has persisted for several years (the ankle swelling started before she began taking Tamoxifen). The patient stated that she was told by her GYN Dr. Billy Coast, that she was in menopause as of 10/2012. The patient denies any other symptomatology.   PAST MEDICAL HISTORY: Past Medical History  Diagnosis Date  . Uterine fibroid   . 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 Mitral valve prolapse   . Scoliosis   . Breast cancer     PAST SURGICAL HISTORY: Past Surgical History  Procedure Laterality Date  . Ganglion cyst excision    . Unterine fibroid  1998    removal  . Right hip resurfaced 2008    . Left hip resurfaced 2011    . Breast lumpectomy  09/12    left breast    FAMILY HISTORY: Family History  Problem Relation Age of Onset  . Paget's disease of bone Mother   . Breast cancer Maternal Aunt   . Colon cancer Neg Hx   . Stomach cancer Neg Hx   . Hypertension Father   . Hyperlipidemia Father     SOCIAL HISTORY: History  Substance Use Topics  . Smoking status: Never Smoker   . Smokeless tobacco: Never Used  . Alcohol Use: Not on file    ALLERGIES: Allergies  Allergen Reactions  . Erythromycin Diarrhea and Nausea Only     MEDICATIONS:  Current Outpatient Prescriptions  Medication Sig Dispense Refill  . Ascorbic Acid (VITAMIN C) 500 MG CAPS Take 500 mg by mouth 2 (two) times daily. 12/13/11- Pt states she does not take this every  day, just as needed.      . Calcium 150 MG TABS Take 1 tablet by mouth 2 (two) times daily.      . Cholecalciferol (VITAMIN D3) 5000 UNITS CAPS Take 500 Int'l Units by mouth daily.      . Magnesium 500 MG CAPS Take 500 mg by mouth 2 (two) times daily.      . Omega-3 Fatty Acids (FISH OIL) 500 MG CAPS Take 500 mg by mouth 2 (two) times daily.        . tamoxifen (NOLVADEX) 20 MG tablet Take 1 tablet (20  mg total) by mouth daily.  90 tablet  8  . vitamin E 400 UNIT capsule Take 400 Units by mouth 2 (two) times daily.       No current facility-administered medications for this visit.      REVIEW OF SYSTEMS: A 10 point review of systems was completed and is negative except as noted above.    PHYSICAL EXAMINATION: BP 112/72  Pulse 82  Temp(Src) 99.2 F (37.3 C) (Oral)  Resp 20  Ht 5\' 7"  (1.702 m)  Wt 136 lb 11.2 oz (62.007 kg)  BMI 21.41 kg/m2   General appearance: Alert, cooperative, well nourished, thin frame, no apparent distress Head: Normocephalic, without obvious abnormality, atraumatic Eyes: Conjunctivae/corneas clear, PERRLA, EOMI Nose: Nares, septum and mucosa are normal, no drainage or sinus tenderness Neck: No adenopathy, supple, symmetrical, trachea midline, thyroid not enlarged, no tenderness Resp: Clear to auscultation bilaterally Cardio: Regular rate and rhythm, S1, S2 normal, no murmur, click, rub or gallop Breasts: Soft bilaterally, large/firm/round seroma above upper outer quadrant of left breast, left breast well-healed surgical scars, no lymphadenopathy, no nipple inversion, no axilla fullness, benign breast exam GI: Soft, non-distended, non-tender, hypoactive bowel sounds, no organomegaly Extremities: Extremities normal, atraumatic, no cyanosis or edema, bilateral lower extremity varicose veins (RLE > LLE) Lymph nodes: Cervical, supraclavicular, and axillary nodes normal Neurologic: Grossly normal    ECOG FS:  Grade 1 - Symptomatic but completely ambulatory   LAB RESULTS: Lab Results  Component Value Date   WBC 3.7* 03/01/2013   NEUTROABS 2.3 03/01/2013   HGB 13.0 03/01/2013   HCT 39.1 03/01/2013   MCV 90.1 03/01/2013   PLT 183 03/01/2013      Chemistry      Component Value Date/Time   NA 140 03/01/2013 1020   NA 141 07/23/2012 0820   K 4.4 03/01/2013 1020   K 4.2 07/23/2012 0820   CL 105 03/01/2013 1020   CL 106 07/23/2012 0820   CO2 26 03/01/2013 1020    CO2 28 07/23/2012 0820   BUN 15.4 03/01/2013 1020   BUN 13 07/23/2012 0820   CREATININE 0.7 03/01/2013 1020   CREATININE 0.80 07/23/2012 0820      Component Value Date/Time   CALCIUM 9.1 03/01/2013 1020   CALCIUM 9.0 07/23/2012 0820   ALKPHOS 52 03/01/2013 1020   ALKPHOS 44 07/23/2012 0820   AST 21 03/01/2013 1020   AST 16 07/23/2012 0820   ALT 22 03/01/2013 1020   ALT 18 07/23/2012 0820   BILITOT 0.39 03/01/2013 1020   BILITOT 0.6 07/23/2012 0820      RADIOGRAPHIC STUDIES: No results found.   ASSESSMENT: 57 y.o. [place], [place] woman with: 1. Status post left breast lumpectomy for stage 0, pTis pNX, DCIS with calcifications and comedone necrosis, high-grade, ER 97%, PR 100%, no nodes examined, margins are not involved. Status post radiation therapy that was completed on 11/30/2011. Antiestrogen  therapy with Tamoxifen since 12/2011. The patient experiences hot flashes and night sweats.  2. Bilateral digital diagnostic mammogram on 08/21/2012 showed at the site of the previously seen with a cluster of calcifications in the upper outer left breast posterior, there is a new round, well-circumscribed 5 cm fluid collection with adjacent surgical clips consistent with a postoperative seroma. This corresponds to the patient's palpable abnormality. There is new left breast skin thickening, likely from radiation therapy. No suspicious calcifications, involving architectural distortion or new mass. No significant interval change of the right breast.  3. The patient's last bone density examination on 10/11/2011 showed a T score of 0.4 (normal).   PLAN: 1. The patient will continue antiestrogen therapy with Tamoxifen 20 mg by mouth daily. An electronic prescription for Tamoxifen #90 with 8 refills was sent to the patient's pharmacy. The patient declines any prescription medication for ongoing hot flashes and night sweats. She was encouraged to try over-the-counter Peridin-C for hot flashes and  night sweats in addition to exercise/meditation/water aerobics, a healthy diet, and to consume plenty of water for hydration.  2. The patient will be due for her annual bilateral digital diagnostic mammogram and 08/2013. We will schedule a mammogram for the patient.    3. We plan to see Isabel [name] again in 6 months at which time we will check a CBC, CMP and vitamin D level. The vitamin D level collected today is pending at this time.  All questions were answered.  The patient was encouraged to contact us in the interim with any problems, questions or concerns.    Larina Bras, NP-C 03/01/2013, 3:48 PM

## 2013-03-01 NOTE — Telephone Encounter (Signed)
gv pt appt schedule for September and mammo for 9/19 @ 9:30 am @ [facility].

## 2013-03-02 LAB — VITAMIN D 25 HYDROXY (VIT D DEFICIENCY, FRACTURES): Vit D, 25-Hydroxy: 49 ng/mL (ref 30–89)

## 2013-03-04 ENCOUNTER — Telehealth: Payer: Self-pay

## 2013-03-04 NOTE — Telephone Encounter (Signed)
LVMOM regarding Vit D lab result from 3/28. Per [facility], lab result ok. Continue taking Vit D as instructed. Call office with any questions. TMB

## 2013-04-03 ENCOUNTER — Ambulatory Visit (INDEPENDENT_AMBULATORY_CARE_PROVIDER_SITE_OTHER): Payer: 59 | Admitting: Surgery

## 2013-05-31 ENCOUNTER — Encounter (INDEPENDENT_AMBULATORY_CARE_PROVIDER_SITE_OTHER): Payer: Self-pay | Admitting: Surgery

## 2013-05-31 ENCOUNTER — Ambulatory Visit (INDEPENDENT_AMBULATORY_CARE_PROVIDER_SITE_OTHER): Payer: BC Managed Care – PPO | Admitting: Surgery

## 2013-05-31 VITALS — BP 128/72 | HR 80 | Resp 16 | Ht 67.0 in | Wt 137.4 lb

## 2013-05-31 DIAGNOSIS — D0590 Unspecified type of carcinoma in situ of unspecified breast: Secondary | ICD-10-CM

## 2013-05-31 DIAGNOSIS — D0512 Intraductal carcinoma in situ of left breast: Secondary | ICD-10-CM

## 2013-05-31 NOTE — Progress Notes (Signed)
Name:  Isabel [name] MRN:  [id number] DOB:  1956-05-01  MDBC   ASSESSMENT AND PLAN: 1.  DCIS left breast, UOQ, 2 o'clock.   ER/PR positive.   The patient had a lumpectomy (no SLNBx) on 08/25/2011.   She had a 1.5 cm DCIS, deep margin at 0.1 cm.  She completed rad tx in Dec 2012 by Dr. Michell Heinrich.  She is now on Tamoxifen.  Oncology - Welton Flakes ([place])/[place]  Disease free, though noticeable lump at lumpectomy site.  To see me back in 6 months.  2. Mother has Paget's disease.  She was treated with a central lumpectomy in [place]  3. Heart murmur, MVP. Has seen Dr. Becky Augusta.  4. Bilateral hip osteoarthritis.  (This may limit aromatase inhibitors.)  Sees Dr. Rudene Re at [facility].  5. Vaginal discharge/bleeding.    Follow by Dr. Jorene Minors.    She had her [facility] checked fall 2013 which showed she is in menopause.  This is better.  6.  Has approx 4.5 cm mass at biopsy site (UOQ left breast) - seroma/hematoma/debris in biopsy site.  This is about 20% better than when I last saw her, but does not appeat that it will resolve.  We will continue to watch it for now.  I dare say that the radiation may impede complete resolution.  HISTORY OF PRESENT ILLNESS: Isabel [name] is a 57 y.o. (DOB: 1956/05/07)  white female who is a patient of Dr. Juline Patch and comes to me today for follow up of left breast cancer.  She is doing well.  She has a little bit of flat affect and is a little hard to talk to.  Her biggest problem is that her husband lost his job 2 weeks ago and she has had her hours decreased.  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  History of breast cancer: The patient had a left breast lumpectomy on 08/25/2011.  This tumor was towards the left axillary tail of Spence and was against the left pectoralis major muscle.  She has a mass at the biopsy site (as she said, she had a "lumpectomy" and she still has a "lump").    She still has a lump in the UOQ of the left breast.   The mass may be a little better than when I saw her 3 months ago, but it is not much better.  She also notices that her left breast skin is "redder" than the right.  I tried to reassure that this should get better with time.  She is still having some vaginal bleeding/discharge.  This is actually better than before she was put on Tamoxifen.  We talked about follow up with Drs. Donnie Coffin and [employer].  Her mother's has Paget's disease.  And she has an aunt (her mother's sister), who has DCIS.  Path (side, TNM): DCIS  Surgery: Left breast lumpectomy   Date: 20 Sept 2012 Size of tumor: 1.5 cm (microcalcifications)  Nodes: -/-  ER: 97% PR: 100% Ki67: - HER2Neu: -  Medical Oncologist: Vernell Morgans)    Radiation Oncologist: Michell Heinrich  PHYSICAL EXAM: BP 128/72  Pulse 80  Resp 16  Ht 5\' 7"  (1.702 m)  Wt 137 lb 6.4 oz (62.324 kg)  BMI 21.51 kg/m2  HEENT:  Pupils equal.  Dentition good.  No injury. NECK:  Supple.  No thyroid mass. LYMPH NODES:  No cervical, supraclavicular, or axillary adenopathy. BREASTS -  RIGHT:  No palpable mass or nodule.  No nipple  discharge.   LEFT:  Left breast incision in UOQ is well healed.  She has a 4.5 cm mass at the biopsy site.  She says that she notices some swelling in her axilla. UPPER EXTREMITIES:  No evidence of lymphedema.  Ultrasound of left breast: Not done this time.  DATA REVIEWED: [facility] - 08/21/2012 - neg except post op changes.  Ovidio Kin, MD, [facility] Surgery Pager: [phone] Office phone:  [phone]

## 2013-07-19 ENCOUNTER — Telehealth: Payer: Self-pay | Admitting: *Deleted

## 2013-07-19 NOTE — Telephone Encounter (Signed)
Per MD, notified pt to bring copy of labs with her to appt with MD on 9/18. Pt verbalized understanding. No further concerns.

## 2013-07-19 NOTE — Telephone Encounter (Signed)
Pt called L:MOVM states " I would like to know if the labs they draw at  Dr. Lynne Logan office on 09/05 for my yearly physical. Can this bloodwork be used for my appt with [name] on 8/19? I will have to pay out of pocket for the labs to be drawn on 8/19." Will review with MD

## 2013-07-19 NOTE — Telephone Encounter (Signed)
Yes those labs can be used if they are in our system or if she can bring the results to [place].

## 2013-08-23 ENCOUNTER — Other Ambulatory Visit: Payer: 59 | Admitting: Lab

## 2013-09-02 ENCOUNTER — Telehealth: Payer: Self-pay | Admitting: Oncology

## 2013-09-02 ENCOUNTER — Ambulatory Visit (HOSPITAL_BASED_OUTPATIENT_CLINIC_OR_DEPARTMENT_OTHER): Payer: BC Managed Care – PPO | Admitting: Oncology

## 2013-09-02 ENCOUNTER — Encounter: Payer: Self-pay | Admitting: Oncology

## 2013-09-02 ENCOUNTER — Other Ambulatory Visit: Payer: Self-pay | Admitting: Oncology

## 2013-09-02 VITALS — BP 122/77 | HR 75 | Temp 98.8°F | Resp 18 | Ht 67.0 in | Wt 141.7 lb

## 2013-09-02 DIAGNOSIS — D0590 Unspecified type of carcinoma in situ of unspecified breast: Secondary | ICD-10-CM

## 2013-09-02 DIAGNOSIS — D0512 Intraductal carcinoma in situ of left breast: Secondary | ICD-10-CM

## 2013-09-02 NOTE — Progress Notes (Signed)
[facility]  Telephone:([phone]) [phone] Fax:([phone]) [phone]  OFFICE PROGRESS NOTE  PATIENT: Isabel [name]   DOB: [birth year]  MR#: [id number]  JYN#:[id number]   QM:VHQI,ONGEXBM, MD Olivia Mackie, MD Sandria Bales. Ezzard Standing, MD Lurline Hare, MD  DIAGNOSIS:  A 57 year old [place], [place] woman with left breast ER positive DCIS diagnosed in 07/2011.   PRIOR THERAPY: 1. Status post abnormal screening mammogram on 07/30/2011 which showed a potential abnormality detected in the left breast, which required further evaluation. New, indeterminate calcifications were present in the upper outer quadrant. No abnormalities were observed in the right breast.  2. Status post abnormal diagnostic mammogram and ultrasound on 08/02/2011 which showed a cluster of pleomorphic calcifications in the upper outer quadrant of the left breast measuring 2.3 x 1.0 cm noted on magnification views. Stereotactic left breast biopsy recommended.  3. Status post left breast needle core biopsy on 08/09/2011 which showed a high-grade ductal carcinoma in situ, with comedonecrosis and calcification, ER +97%, PR +100%.  4. Status post bilateral breast MRI on 08/15/2011 which showed a 1.1 cm of enhancement far posteriorly in the upper outer quadrant of the left breast corresponding with the known DCIS.  5. Status post left breast lumpectomy on 08/25/2011 for a stage 0, pTis, PNX, for a high-grade DCIS with calcifications and comedo necrosis. No lymph nodes were examined, margins were not involved.  6. Status post radiation therapy from this 10/13/2011 through 11/30/2011.  7. Antiestrogen therapy with Tamoxifen since 12/2011. Discontinue tamoxifen due to uterine bleeding   CURRENT THERAPY: hold tamoxifen    INTERVAL HISTORY:  Isabel [name] today for follow up of left breast DCIS.  the patient states that she experiences ongoing hot flashes and night sweats (that have improved) and intermittent swelling of  her right ankle that has persisted for several years (the ankle swelling started before she began taking Tamoxifen). Patient has experienced uterine bleeding with cramping lasting 5 days. Then stopped, felt like a real period. This occurred after being without a period for a year. The remainder of the 10 point of review of systems is negative.  PAST MEDICAL HISTORY: Past Medical History  Diagnosis Date  . Uterine fibroid   . 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 Mitral valve prolapse   . Scoliosis   . Breast cancer     PAST SURGICAL HISTORY: Past Surgical History  Procedure Laterality Date  . Ganglion cyst excision    . Unterine fibroid  1998    removal  . Right hip resurfaced 2008    . Left hip resurfaced 2011    . Breast lumpectomy  09/12    left breast    FAMILY HISTORY: Family History  Problem Relation Age of Onset  . Paget's disease of bone Mother   . Breast cancer Maternal Aunt   . Colon cancer Neg Hx   . Stomach cancer Neg Hx   . Hypertension Father   . Hyperlipidemia Father     SOCIAL HISTORY: History  Substance Use Topics  . Smoking status: Never Smoker   . Smokeless tobacco: Never Used  . Alcohol Use: 4.2 oz/week    7 Glasses of wine per week    ALLERGIES: Allergies  Allergen Reactions  . Erythromycin Diarrhea and Nausea Only     MEDICATIONS:  Current Outpatient Prescriptions  Medication Sig Dispense Refill  . Ascorbic Acid (VITAMIN C) 500 MG CAPS Take 500 mg by mouth 2 (two) times daily. 12/13/11- Pt states she  does not take this every day, just as needed.      . Calcium 150 MG TABS Take 1 tablet by mouth 2 (two) times daily.      . Cholecalciferol (VITAMIN D3) 5000 UNITS CAPS Take 500 Int'l Units by mouth daily.      . Magnesium 500 MG CAPS Take 500 mg by mouth 2 (two) times daily.      . Omega-3 Fatty Acids (FISH OIL) 500 MG CAPS Take 500 mg by mouth 2 (two) times daily.        . tamoxifen (NOLVADEX) 20 MG tablet Take 1 tablet (20 mg  total) by mouth daily.  90 tablet  8  . vitamin E 400 UNIT capsule Take 400 Units by mouth 2 (two) times daily.       No current facility-administered medications for this visit.      REVIEW OF SYSTEMS: A 10 point review of systems was completed and is negative except as noted above.    PHYSICAL EXAMINATION: BP 122/77  Pulse 75  Temp(Src) 98.8 F (37.1 C) (Oral)  Resp 18  Ht 5\' 7"  (1.702 m)  Wt 141 lb 11.2 oz (64.275 kg)  BMI 22.19 kg/m2   General appearance: Alert, cooperative, well nourished, thin frame, no apparent distress Head: Normocephalic, without obvious abnormality, atraumatic Eyes: Conjunctivae/corneas clear, PERRLA, EOMI Nose: Nares, septum and mucosa are normal, no drainage or sinus tenderness Neck: No adenopathy, supple, symmetrical, trachea midline, thyroid not enlarged, no tenderness Resp: Clear to auscultation bilaterally Cardio: Regular rate and rhythm, S1, S2 normal, no murmur, click, rub or gallop Breasts: Soft bilaterally, large/firm/round seroma above upper outer quadrant of left breast, left breast well-healed surgical scars, no lymphadenopathy, no nipple inversion, no axilla fullness, benign breast exam GI: Soft, non-distended, non-tender, hypoactive bowel sounds, no organomegaly Extremities: Extremities normal, atraumatic, no cyanosis or edema, bilateral lower extremity varicose veins (RLE > LLE) Lymph nodes: Cervical, supraclavicular, and axillary nodes normal Neurologic: Grossly normal    ECOG FS:  Grade 1 - Symptomatic but completely ambulatory   LAB RESULTS: Lab Results  Component Value Date   WBC 3.7* 03/01/2013   NEUTROABS 2.3 03/01/2013   HGB 13.0 03/01/2013   HCT 39.1 03/01/2013   MCV 90.1 03/01/2013   PLT 183 03/01/2013      Chemistry      Component Value Date/Time   NA 140 03/01/2013 1020   NA 141 07/23/2012 0820   K 4.4 03/01/2013 1020   K 4.2 07/23/2012 0820   CL 105 03/01/2013 1020   CL 106 07/23/2012 0820   CO2 26 03/01/2013 1020    CO2 28 07/23/2012 0820   BUN 15.4 03/01/2013 1020   BUN 13 07/23/2012 0820   CREATININE 0.7 03/01/2013 1020   CREATININE 0.80 07/23/2012 0820      Component Value Date/Time   CALCIUM 9.1 03/01/2013 1020   CALCIUM 9.0 07/23/2012 0820   ALKPHOS 52 03/01/2013 1020   ALKPHOS 44 07/23/2012 0820   AST 21 03/01/2013 1020   AST 16 07/23/2012 0820   ALT 22 03/01/2013 1020   ALT 18 07/23/2012 0820   BILITOT 0.39 03/01/2013 1020   BILITOT 0.6 07/23/2012 0820      RADIOGRAPHIC STUDIES: No results found.   ASSESSMENT: 57 y.o. [place], [place] woman with:  1. Status post left breast lumpectomy for stage 0, pTis pNX, DCIS with calcifications and comedone necrosis, high-grade, ER 97%, PR 100%, no nodes examined, margins are not involved. Status post radiation therapy  that was completed on 11/30/2011. Antiestrogen therapy with Tamoxifen since 12/2011. The patient experiences hot flashes and night sweats.  2. Bilateral digital diagnostic mammogram on 08/21/2012 showed at the site of the previously seen with a cluster of calcifications in the upper outer left breast posterior, there is a new round, well-circumscribed 5 cm fluid collection with adjacent surgical clips consistent with a postoperative seroma. This corresponds to the patient's palpable abnormality. There is new left breast skin thickening, likely from radiation therapy. No suspicious calcifications, involving architectural distortion or new mass. No significant interval change of the right breast.  3. The patient's last bone density examination on 10/11/2011 showed a T score of 0.4 (normal).   PLAN: 1. Hold tamoxifen  For now due to uterine  Bleeding  2. Seen Dr. Billy Coast for follow up to uterine bleeding  3. We will see you back in 6 months  All questions were answered.  The patient was encouraged to contact us in the interim with any problems, questions or concerns. The length of time of the face-to-face encounter was 25    minutes. More  than 50% of time was spent counseling and coordination of care.   Drue Second, MD Medical/Oncology [facility] [phone] (beeper) [phone] (Office)  09/02/2013, 9:36 AM

## 2013-09-02 NOTE — Telephone Encounter (Signed)
No pof sent to scheduling and no onc tx details attached to office. Per pt she is to call after having eval w/Dr. Billy Coast. Message to KK re no pof.

## 2013-09-02 NOTE — Patient Instructions (Addendum)
Stop tamoxifen due to vaginal bleeding and go to Dr. Billy Coast for a full evaluation  Please call once the evaluation is completed and then we will make a decision regarding restarting tamoxifen

## 2013-09-03 ENCOUNTER — Telehealth: Payer: Self-pay | Admitting: *Deleted

## 2013-09-03 NOTE — Telephone Encounter (Signed)
Lm gv appt d/t for 03/03/14 with labs @ 12pm and ov@ 12:30pm. Pt is aware that i will mail a letter/avs...td

## 2013-09-03 NOTE — Addendum Note (Signed)
Addended by: Lorenza Evangelist A on: 09/03/2013 10:25 AM   Modules accepted: Orders

## 2013-11-20 ENCOUNTER — Encounter (INDEPENDENT_AMBULATORY_CARE_PROVIDER_SITE_OTHER): Payer: Self-pay | Admitting: Surgery

## 2014-01-17 ENCOUNTER — Encounter (INDEPENDENT_AMBULATORY_CARE_PROVIDER_SITE_OTHER): Payer: Self-pay | Admitting: Surgery

## 2014-01-17 ENCOUNTER — Ambulatory Visit (INDEPENDENT_AMBULATORY_CARE_PROVIDER_SITE_OTHER): Payer: BC Managed Care – PPO | Admitting: Surgery

## 2014-01-17 VITALS — BP 126/72 | HR 66 | Resp 18 | Ht 67.0 in | Wt 141.0 lb

## 2014-01-17 DIAGNOSIS — D0590 Unspecified type of carcinoma in situ of unspecified breast: Secondary | ICD-10-CM

## 2014-01-17 DIAGNOSIS — D0510 Intraductal carcinoma in situ of unspecified breast: Secondary | ICD-10-CM

## 2014-01-17 NOTE — Progress Notes (Signed)
Name:  Isabel [name] MRN:  [id number] DOB:  Jun 13, 1956  MDBC   ASSESSMENT AND PLAN: 1.  DCIS left breast, UOQ, 2 o'clock.   ER/PR positive.   The patient had a lumpectomy (no SLNBx) on 08/25/2011.   She had a 1.5 cm DCIS, deep margin at 0.1 cm.  She completed rad tx in Dec 2012 by Dr. Pablo Ledger.  She is now on Tamoxifen.  (This was held briefly while they were working up he vaginal discharge.  Oncology - Humphrey Rolls ([place])/[place]  Disease free, though noticeable lump at lumpectomy site.  To see me back in 6 months.  2. Mother has Paget's disease.  She was treated with a central lumpect[place] in [place]  3. Heart murmur, MVP. Has seen Dr. Rae Halsted.  4. Bilateral hip osteoarthritis.  (This may limit aromatase inhibitors.)  Sees Dr. Yevonne Aline at [facility].  5. Vaginal discharge/bleeding - stopped.  Follow by Dr. Kennith Maes.    She had a biopsy in the fall, 2014, which was benign.  She has had no more discharge since then.  6.  Has approx 3.0 cm mass at biopsy site (UOQ left breast) - seroma/hematoma/debris in biopsy site.  This continues to get better.  And we talked about would it ever go entirely away.  I doubt it, but it is getting smaller with time.  HISTORY OF PRESENT ILLNESS: Takeya Marquis Prentiss is a 58 y.o. (DOB: 31-Oct-1956)  white female who is a patient of Dr. Tommy Medal and comes to me today for follow up of left breast cancer.  She is doing well.  She has no area of concern.   She has a little bit of flat affect.  He husband is still looking for work - he has had a job and now not.  She said her work is always a little iffy.  History of breast cancer: The patient had a left breast lumpectomy on 08/25/2011.  This tumor was towards the left axillary tail of Sp[place] and was against the left pectoralis major muscle.  She has a mass at the biopsy site (as she said, she had a "lumpectomy" and she still has a "lump").    She still has a lump in the UOQ of the left breast.  The  mass may be a little better than when I saw her 3 months ago, but it is not much better.  She also notices that her left breast skin is "redder" than the right.  I tried to reassure that this should get better with time. Her mother's has Paget's disease.  And she has an aunt (her mother's sister), who has DCIS.  Path (side, TNM): DCIS  Surgery: Left breast lumpectomy   Date: 20 Sept 2012 Size of tumor: 1.5 cm (microcalcifications)  Nodes: -/-  ER: 97% PR: 100% Ki67: - HER2Neu: -  Medical Oncologist: Ferol Luz)    Radiation Oncologist: Pablo Ledger  PHYSICAL EXAM: BP 126/72  Pulse 66  Resp 18  Ht $R'5\' 7"'Pg$  (1.702 m)  Wt 141 lb (63.957 kg)  BMI 22.08 kg/m2  HEENT:  Pupils equal.  Dentition good.  No injury. NECK:  Supple.  No thyroid mass. LYMPH NODES:  No cervical, supraclavicular, or axillary adenopathy. BREASTS -  RIGHT:  No palpable mass or nodule.  No nipple discharge.   LEFT:  Left breast incision in UOQ is well healed.  She has a 3.0 cm mass at the biopsy site.  She says that she has a  little tightness in raising her left arm over her head. UPPER EXTREMITIES:  No evidence of lymphedema.  DATA REVIEWED: [facility] - 08/23/2013 - neg except post op changes.  Alphonsa Overall, MD, [facility] Surgery Pager: [phone] Office phone:  [phone]

## 2014-02-28 ENCOUNTER — Telehealth: Payer: Self-pay | Admitting: Oncology

## 2014-02-28 ENCOUNTER — Other Ambulatory Visit: Payer: Self-pay | Admitting: *Deleted

## 2014-02-28 NOTE — Telephone Encounter (Signed)
pt called to r/s 3/30 appt. pt made aware that next available appt would not be until june and per pt she is ok with that. per pt this a 6 month f/u that she has been doing for years. per pt she also just saw her surgeon for f/u and is trying to minimize medical expenses and she feels this can wait until  june. message to desk nurse.

## 2014-03-03 ENCOUNTER — Ambulatory Visit: Payer: BC Managed Care – PPO | Admitting: Oncology

## 2014-03-03 ENCOUNTER — Other Ambulatory Visit: Payer: BC Managed Care – PPO

## 2014-05-13 ENCOUNTER — Telehealth: Payer: Self-pay

## 2014-05-13 MED ORDER — TAMOXIFEN CITRATE 20 MG PO TABS: 20.0000 mg | ORAL_TABLET | Freq: Every day | ORAL | Status: DC

## 2014-05-13 NOTE — Telephone Encounter (Signed)
Refill request for tamoxifen from triage.   Spoke with pt.  Pt reports seeing Dr. Ronita Hipps.  Procedure and biopsy done.  Uterine bleeding resolved.  Pt re-started tamoxifen once problem resolved.   Pt saw Dr. Lucia Gaskins in Feb.  Per [facility] - ok to push patient's follow up appt out to August.   Refill sent with adequate supply until patient's next appt.  In-box sent to scheduling - Vernetta Honey - ok to move pt to Aug.

## 2014-05-21 ENCOUNTER — Telehealth: Payer: Self-pay | Admitting: Hematology and Oncology

## 2014-05-21 NOTE — Telephone Encounter (Signed)
, °

## 2014-05-28 ENCOUNTER — Other Ambulatory Visit: Payer: Self-pay | Admitting: *Deleted

## 2014-05-28 DIAGNOSIS — D0510 Intraductal carcinoma in situ of unspecified breast: Secondary | ICD-10-CM

## 2014-05-28 DIAGNOSIS — C50419 Malignant neoplasm of upper-outer quadrant of unspecified female breast: Secondary | ICD-10-CM

## 2014-05-29 ENCOUNTER — Ambulatory Visit: Payer: BC Managed Care – PPO | Admitting: Oncology

## 2014-05-29 ENCOUNTER — Telehealth: Payer: Self-pay | Admitting: Hematology and Oncology

## 2014-05-29 ENCOUNTER — Ambulatory Visit (HOSPITAL_BASED_OUTPATIENT_CLINIC_OR_DEPARTMENT_OTHER): Payer: BC Managed Care – PPO | Admitting: Hematology and Oncology

## 2014-05-29 ENCOUNTER — Other Ambulatory Visit (HOSPITAL_BASED_OUTPATIENT_CLINIC_OR_DEPARTMENT_OTHER): Payer: BC Managed Care – PPO

## 2014-05-29 ENCOUNTER — Other Ambulatory Visit: Payer: BC Managed Care – PPO

## 2014-05-29 VITALS — BP 155/81 | HR 69 | Temp 98.6°F | Resp 18 | Ht 67.0 in | Wt 138.3 lb

## 2014-05-29 DIAGNOSIS — D0510 Intraductal carcinoma in situ of unspecified breast: Secondary | ICD-10-CM

## 2014-05-29 DIAGNOSIS — D0590 Unspecified type of carcinoma in situ of unspecified breast: Secondary | ICD-10-CM

## 2014-05-29 DIAGNOSIS — Z170 Estrogen receptor positive status [ER+]: Secondary | ICD-10-CM

## 2014-05-29 DIAGNOSIS — C50419 Malignant neoplasm of upper-outer quadrant of unspecified female breast: Secondary | ICD-10-CM

## 2014-05-29 DIAGNOSIS — C50412 Malignant neoplasm of upper-outer quadrant of left female breast: Secondary | ICD-10-CM

## 2014-05-29 LAB — COMPREHENSIVE METABOLIC PANEL (CC13)
ALK PHOS: 51 U/L (ref 40–150)
ALT: 25 U/L (ref 0–55)
AST: 21 U/L (ref 5–34)
Albumin: 3.9 g/dL (ref 3.5–5.0)
Anion Gap: 8 mEq/L (ref 3–11)
BILIRUBIN TOTAL: 0.47 mg/dL (ref 0.20–1.20)
BUN: 15.3 mg/dL (ref 7.0–26.0)
CO2: 27 mEq/L (ref 22–29)
CREATININE: 0.8 mg/dL (ref 0.6–1.1)
Calcium: 9.1 mg/dL (ref 8.4–10.4)
Chloride: 105 mEq/L (ref 98–109)
GLUCOSE: 97 mg/dL (ref 70–140)
Potassium: 4.3 mEq/L (ref 3.5–5.1)
SODIUM: 140 meq/L (ref 136–145)
TOTAL PROTEIN: 6.5 g/dL (ref 6.4–8.3)

## 2014-05-29 LAB — CBC WITH DIFFERENTIAL/PLATELET
BASO%: 0.6 % (ref 0.0–2.0)
Basophils Absolute: 0 10*3/uL (ref 0.0–0.1)
EOS%: 2.9 % (ref 0.0–7.0)
Eosinophils Absolute: 0.1 10*3/uL (ref 0.0–0.5)
HCT: 39.9 % (ref 34.8–46.6)
HGB: 13.3 g/dL (ref 11.6–15.9)
LYMPH%: 27.4 % (ref 14.0–49.7)
MCH: 30.2 pg (ref 25.1–34.0)
MCHC: 33.3 g/dL (ref 31.5–36.0)
MCV: 90.7 fL (ref 79.5–101.0)
MONO#: 0.5 10*3/uL (ref 0.1–0.9)
MONO%: 13.4 % (ref 0.0–14.0)
NEUT#: 2 10*3/uL (ref 1.5–6.5)
NEUT%: 55.7 % (ref 38.4–76.8)
PLATELETS: 178 10*3/uL (ref 145–400)
RBC: 4.4 10*6/uL (ref 3.70–5.45)
RDW: 12.9 % (ref 11.2–14.5)
WBC: 3.5 10*3/uL — ABNORMAL LOW (ref 3.9–10.3)
lymph#: 1 10*3/uL (ref 0.9–3.3)

## 2014-05-29 NOTE — Progress Notes (Signed)
[place]  Telephone:([phone]) [phone] Fax:(336) [phone]  OFFICE PROGRESS NOTE  PATIENT: Isabel [name]   DOB: 1956/04/16  MR#: [id number]  QIH#:[id number]   OV:FIEP,PIRJJOA, MD Brien Few, MD Fenton Malling. Lucia Gaskins, MD Thea Silversmith, MD  DIAGNOSIS:   left breast  DCIS,ER positive  diagnosed in 07/2011.   PRIOR THERAPY: From the original intake note:  1. Status post abnormal screening mammogram on 07/30/2011 which showed a potential abnormality detected in the left breast, which required further evaluation. New, indeterminate calcifications were present in the upper outer quadrant. No abnormalities were observed in the right breast.  2. Status post abnormal diagnostic mammogram and ultrasound on 08/02/2011 which showed a cluster of pleomorphic calcifications in the upper outer quadrant of the left breast measuring 2.3 x 1.0 cm noted on magnification views. Stereotactic left breast biopsy recommended.  3. Status post left breast needle core biopsy on 08/09/2011 which showed a high-grade ductal carcinoma in situ, with comedonecrosis and calcification, ER +97%, PR +100%.  4. Status post bilateral breast MRI on 08/15/2011 which showed a 1.1 cm of enhancement far posteriorly in the upper outer quadrant of the left breast corresponding with the known DCIS.  5. Status post left breast lumpectomy on 08/25/2011 for a stage 0, pTis, PNX, for a high-grade DCIS with calcifications and comedo necrosis. No lymph nodes were examined, margins were not involved.  6. Status post radiation therapy from this 10/13/2011 through 11/30/2011.  7. Antiestrogen therapy with Tamoxifen since 12/2011. Discontinue tamoxifen due to uterine bleeding   CURRENT THERAPY:tamoxifen    INTERVAL HISTORY:  Mrs. Rhines today for follow up of left breast DCIS.  Since the time of last visit she has seen the gynecology for her vaginal bleeding and she says her biopsy  was negative. Subsequently she called  Dr. Marella Bile office and she was told to resume tamoxifen. Since that time she has been on tamoxifen and tolerating  quite well with minimal side effects of hot flashes. She does complain of varicose veins in both the lower extremities and she was wondering if she okay to proceed with  treatment for it. She complains of sensitivity in the right ankle area. She denies any weight loss or decrease in appetite, he denies any fever, shortness of breath, chest pain, palpitations, blood in the stool blood in the urine, constipation, dysuria. Headaches, blurred vision,  dizziness  PAST MEDICAL HISTORY: Past Medical History  Diagnosis Date  . Uterine fibroid   . 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 Mitral valve prolapse   . Scoliosis   . Breast cancer     PAST SURGICAL HISTORY: Past Surgical History  Procedure Laterality Date  . Ganglion cyst excision    . Unterine fibroid  1998    removal  . Right hip resurfaced 2008    . Left hip resurfaced 2011    . Breast lumpectomy  09/12    left breast    FAMILY HISTORY: Family History  Problem Relation Age of Onset  . Paget's disease of bone Mother   . Breast cancer Maternal Aunt   . Colon cancer Neg Hx   . Stomach cancer Neg Hx   . Hypertension Father   . Hyperlipidemia Father     SOCIAL HISTORY: History  Substance Use Topics  . Smoking status: Never Smoker   . Smokeless tobacco: Never Used  . Alcohol Use: 4.2 oz/week    7 Glasses of wine per week    ALLERGIES:  Allergies  Allergen Reactions  . Erythromycin Diarrhea and Nausea Only     MEDICATIONS:  Current Outpatient Prescriptions  Medication Sig Dispense Refill  . Cholecalciferol (VITAMIN D3) 5000 UNITS CAPS Take 500 Int'l Units by mouth daily.      . Magnesium 500 MG CAPS Take 500 mg by mouth 2 (two) times daily.      . Omega-3 Fatty Acids (FISH OIL) 500 MG CAPS Take 500 mg by mouth 2 (two) times daily.        . tamoxifen (NOLVADEX) 20 MG tablet Take 1 tablet (20  mg total) by mouth daily.  90 tablet  0  . vitamin E 400 UNIT capsule Take 400 Units by mouth 2 (two) times daily.       No current facility-administered medications for this visit.      REVIEW OF SYSTEMS: A 10 point review of systems was completed and is negative except as noted above.    PHYSICAL EXAMINATION: BP 155/81  Pulse 69  Temp(Src) 98.6 F (37 C) (Oral)  Resp 18  Ht 5\' 7"  (1.702 m)  Wt 138 lb 4.8 oz (62.732 kg)  BMI 21.66 kg/m2   General appearance: Alert, cooperative, well nourished, thin frame, no apparent distress Head: Normocephalic, without obvious abnormality, atraumatic Eyes: Conjunctivae/corneas clear, PERRLA, EOMI Nose: Nares, septum and mucosa are normal, no drainage or sinus tenderness Neck: No adenopathy, supple, symmetrical, trachea midline, thyroid not enlarged, no tenderness Resp: Clear to auscultation bilaterally Cardio: Regular rate and rhythm, S1, S2 normal, no murmur, click, rub or gallop Breasts: seroma palpable in upper outer quadrant of left breast, left breast well-healed surgical scars, no lymphadenopathy, no nipple inversion. Right breast no masses felt. No bilateral axillary lymphadenopathy GI: Soft, non-distended, non-tender, hypoactive bowel sounds, no organomegaly Extremities: Extremities normal, atraumatic, no cyanosis or edema, bilateral lower extremity varicose veins (RLE > LLE) Lymph nodes: Cervical, supraclavicular, and axillary nodes normal Neurologic: Grossly normal    ECOG FS:  Grade 0 -  completely ambulatory   LAB RESULTS: Lab Results  Component Value Date   WBC 3.5* 05/29/2014   NEUTROABS 2.0 05/29/2014   HGB 13.3 05/29/2014   HCT 39.9 05/29/2014   MCV 90.7 05/29/2014   PLT 178 05/29/2014      Chemistry      Component Value Date/Time   NA 140 05/29/2014 0950   NA 141 07/23/2012 0820   K 4.3 05/29/2014 0950   K 4.2 07/23/2012 0820   CL 105 03/01/2013 1020   CL 106 07/23/2012 0820   CO2 27 05/29/2014 0950   CO2 28 07/23/2012  0820   BUN 15.3 05/29/2014 0950   BUN 13 07/23/2012 0820   CREATININE 0.8 05/29/2014 0950   CREATININE 0.80 07/23/2012 0820      Component Value Date/Time   CALCIUM 9.1 05/29/2014 0950   CALCIUM 9.0 07/23/2012 0820   ALKPHOS 51 05/29/2014 0950   ALKPHOS 44 07/23/2012 0820   AST 21 05/29/2014 0950   AST 16 07/23/2012 0820   ALT 25 05/29/2014 0950   ALT 18 07/23/2012 0820   BILITOT 0.47 05/29/2014 0950   BILITOT 0.6 07/23/2012 0820      RADIOGRAPHIC STUDIES: No results found.   ASSESSMENT/PLAN: 58 y.o. [place], [place] woman with:  1. Status post left breast lumpectomy for stage 0, pTis pNX, DCIS with calcifications and comedone necrosis, high-grade, ER 97%, PR 100%, no nodes examined, margins are not involved. Status post radiation therapy that was completed on 11/30/2011. Antiestrogen therapy  with Tamoxifen started in 12/2011.   2. I have reviewed her mammogram performed in September 2014 that revealed no evidence of any malignancy. We will arrange for annual diagnostic mammogram in September 2015   3. Vaginal bleeding resolved .she was evaluated by Dr. Ronita Hipps and had a biopsy done and was negative. she resumed tamoxifen after holding for one month. Continue tamoxifen to complete her 5 years of tamoxifen in February 2018  4. I have asked the patient to follow up with gynecology for annual pelvic exam and Pap smears while she is on tamoxifen therapy  Next followup visit in 6 months with CBC and differential and CMP on the day of next visit.  All questions were answered.  The patient was encouraged to contact us in the interim with any problems, questions or concerns.   The length of time of the face-to-face encounter was 20   minutes. Total time spent was 30 minutes  Wilmon Arms, MD Medical/Oncology [facility] [phone] (Office)  05/29/2014, 10:42 AM

## 2014-05-29 NOTE — Telephone Encounter (Signed)
, °

## 2014-07-08 ENCOUNTER — Encounter (INDEPENDENT_AMBULATORY_CARE_PROVIDER_SITE_OTHER): Payer: Self-pay | Admitting: Surgery

## 2014-07-25 ENCOUNTER — Telehealth: Payer: Self-pay | Admitting: *Deleted

## 2014-07-25 NOTE — Telephone Encounter (Signed)
PT. WOULD LIKE TO SPLIT HER TAMOXIFEN INTO TWO DOSES TO SEE IF THIS WOULD DECREASE HER HOT FLASHES. SPOKE WITH DR.MAGRINAT'S NURSE, VAL DODD,RN. IT WOULD BE OK TO SPLIT PT.'S TAMOXIFEN INTO TWO DOSES. SHE COULD ALSO TRY OVER THE COUNTER PERIDEM-C. ANOTHER OPTION WOULD BE YOGA. NOTIFIED PT. SHE VOICES UNDERSTANDING.

## 2014-10-29 ENCOUNTER — Other Ambulatory Visit: Payer: Self-pay | Admitting: Adult Health

## 2014-11-05 ENCOUNTER — Other Ambulatory Visit: Payer: Self-pay | Admitting: Adult Health

## 2014-11-05 DIAGNOSIS — C50412 Malignant neoplasm of upper-outer quadrant of left female breast: Secondary | ICD-10-CM

## 2014-11-06 ENCOUNTER — Telehealth: Payer: Self-pay

## 2014-11-06 NOTE — Telephone Encounter (Signed)
Lab results rcvd frin [facility] 08/15/14.  Copy to Dr Lindi Adie.  Original to scan.

## 2014-11-20 ENCOUNTER — Other Ambulatory Visit: Payer: Self-pay

## 2014-11-20 DIAGNOSIS — D0510 Intraductal carcinoma in situ of unspecified breast: Secondary | ICD-10-CM

## 2014-11-20 DIAGNOSIS — C50419 Malignant neoplasm of upper-outer quadrant of unspecified female breast: Secondary | ICD-10-CM

## 2014-11-21 ENCOUNTER — Other Ambulatory Visit: Payer: BC Managed Care – PPO

## 2014-11-21 ENCOUNTER — Telehealth: Payer: Self-pay | Admitting: Hematology and Oncology

## 2014-11-21 ENCOUNTER — Ambulatory Visit (HOSPITAL_BASED_OUTPATIENT_CLINIC_OR_DEPARTMENT_OTHER): Payer: BC Managed Care – PPO | Admitting: Hematology and Oncology

## 2014-11-21 VITALS — BP 137/75 | HR 79 | Temp 98.7°F | Resp 18 | Ht 67.0 in | Wt 141.6 lb

## 2014-11-21 DIAGNOSIS — D0510 Intraductal carcinoma in situ of unspecified breast: Secondary | ICD-10-CM

## 2014-11-21 DIAGNOSIS — Z853 Personal history of malignant neoplasm of breast: Secondary | ICD-10-CM

## 2014-11-21 DIAGNOSIS — C50419 Malignant neoplasm of upper-outer quadrant of unspecified female breast: Secondary | ICD-10-CM

## 2014-11-21 DIAGNOSIS — C50412 Malignant neoplasm of upper-outer quadrant of left female breast: Secondary | ICD-10-CM

## 2014-11-21 NOTE — Progress Notes (Signed)
Mammogram rcvd from [facility]td 08/29/14 Dr Marcelo Baldy.  Copy to Dr Lindi Adie.  Original to scan.

## 2014-11-21 NOTE — Telephone Encounter (Signed)
per pof to sch pt appt-gave pt copy of sch °

## 2014-11-21 NOTE — Assessment & Plan Note (Addendum)
Left breast DCIS status post lumpectomy followed by adjuvant radiation therapy ER 97% when necessary her percent, margins negative, tamoxifen started January 2013 discontinued September 2014 due to heavy uterine bleeding.  Tamoxifen toxicities: 1. Hot flashes initially very severe but now much improved 2. Muscle cramps: Much improved after she started magnesium supplementation.   Breast cancer surveillance: 1. Breast exam 11/21/2014 normal 2. Mammogram not available to review Continue annual surveillance with mammograms and physical exams. Because she has to be everything out of pocket, she would like to see only one doctor for her annual visits. She canceled the appointment with general surgeon. Return to clinic in 1 year for follow-up

## 2014-11-21 NOTE — Progress Notes (Signed)
Patient Care Team: Tommy Medal, MD as PCP - General (Internal Medicine) Lovenia Kim, MD as Consulting Physician (Obstetrics and Gynecology) Thea Silversmith, MD as Consulting Physician (Radiation Oncology) Sallyanne Havers, MD as Consulting Physician (Orthopedic Surgery) Alphonsa Overall, MD (General Surgery) Deatra Robinson, MD as Consulting Physician (Hematology and Oncology)  DIAGNOSIS: Malignant neoplasm of upper-outer quadrant of left female breast   Staging form: Breast, AJCC 7th Edition     Pathologic: Stage 0 (Tis, N0, cM0) - Signed by Thea Silversmith, MD on 10/13/2011   SUMMARY OF ONCOLOGIC HISTORY:   Malignant neoplasm of upper-outer quadrant of left female breast   08/09/2011 Initial Diagnosis Left breast biopsy: high-grade ductal carcinoma in situ, with comedonecrosis and calcification, ER +97%, PR +100%.   08/25/2011 Surgery Left breast lumpectomy:stage 0, pTis, PNX, for a high-grade DCIS with calcifications and comedo necrosis. No lymph nodes were examined, margins were not involved.   10/13/2011 - 11/30/2011 Radiation Therapy Adjuvant radiation therapy   12/13/2011 - 09/02/2013 Anti-estrogen oral therapy Tamoxifen 20 mg daily discontinued due to uterine bleeding    CHIEF COMPLIANT: Annual follow-up of DCIS on tamoxifen  INTERVAL HISTORY: Isabel [name] is a 58 year old Caucasian with above-mentioned history of DCIS involving the left breast with lumpectomy and radiation. She has been on tamoxifen for the past 3 years. She has been tolerating tamoxifen relatively well. Initially she had a lot of hot flashes that made her life quite miserable. Eventually she overcame all those side effects and she is managing and tolerating tamoxifen fairly well. She also had leg cramps that were very intense and once she started taking calcium and magnesium supplementation dose of cramps had gone away.  REVIEW OF SYSTEMS:   Constitutional: Denies fevers, chills or abnormal weight loss Eyes: Denies  blurriness of vision Ears, nose, mouth, throat, and face: Denies mucositis or sore throat Respiratory: Denies cough, dyspnea or wheezes Cardiovascular: Denies palpitation, chest discomfort or lower extremity swelling Gastrointestinal:  Denies nausea, heartburn or change in bowel habits Skin: Denies abnormal skin rashes Lymphatics: Denies new lymphadenopathy or easy bruising Neurological:Denies numbness, tingling or new weaknesses Behavioral/Psych: Mood is stable, no new changes  Breast: Left breast upper outer quadrant where she had surgery there is a hematoma that apparently is smaller than before it feels like a palpable nodule All other systems were reviewed with the patient and are negative.  I have reviewed the past medical history, past surgical history, social history and family history with the patient and they are unchanged from previous note.  ALLERGIES:  is allergic to erythromycin.  MEDICATIONS:  Current Outpatient Prescriptions  Medication Sig Dispense Refill  . calcium carbonate 200 MG capsule Take 150 mg by mouth 2 (two) times daily with a meal.    . Cholecalciferol (VITAMIN D3) 5000 UNITS CAPS Take 500 Int'l Units by mouth daily.    . L-GLUTAMINE PO Take by mouth 2 (two) times daily.    . Magnesium 500 MG CAPS Take 500 mg by mouth 2 (two) times daily.    . Omega-3 Fatty Acids (FISH OIL) 500 MG CAPS Take 500 mg by mouth 2 (two) times daily.     . tamoxifen (NOLVADEX) 10 MG tablet TAKE 2 TABLETS BY MOUTH ONCE DAILY 180 tablet 0  . vitamin E 400 UNIT capsule Take 400 Units by mouth 2 (two) times daily.     No current facility-administered medications for this visit.    PHYSICAL EXAMINATION: ECOG PERFORMANCE STATUS: 0 - Asymptomatic  Filed Vitals:  11/21/14 0903  BP: 137/75  Pulse: 79  Temp: 98.7 F (37.1 C)  Resp: 18   Filed Weights   11/21/14 0903  Weight: 141 lb 9.6 oz (64.229 kg)    GENERAL:alert, no distress and comfortable SKIN: skin color, texture,  turgor are normal, no rashes or significant lesions EYES: normal, Conjunctiva are pink and non-injected, sclera clear OROPHARYNX:no exudate, no erythema and lips, buccal mucosa, and tongue normal  NECK: supple, thyroid normal size, non-tender, without nodularity LYMPH:  no palpable lymphadenopathy in the cervical, axillary or inguinal LUNGS: clear to auscultation and percussion with normal breathing effort HEART: regular rate & rhythm and no murmurs and no lower extremity edema ABDOMEN:abdomen soft, non-tender and normal bowel sounds Musculoskeletal:no cyanosis of digits and no clubbing  NEURO: alert & oriented x 3 with fluent speech, no focal motor/sensory deficits BREAST: Palpable nodule upper outer quadrant of the left breast at least 1 inch in diameter slightly fluctuant probably hematoma apparently it is smaller than before. No palpable axillary supraclavicular or infraclavicular adenopathy no breast tenderness or nipple discharge.   LABORATORY DATA:  I have reviewed the data as listed   Chemistry      Component Value Date/Time   NA 140 05/29/2014 0950   NA 141 07/23/2012 0820   K 4.3 05/29/2014 0950   K 4.2 07/23/2012 0820   CL 105 03/01/2013 1020   CL 106 07/23/2012 0820   CO2 27 05/29/2014 0950   CO2 28 07/23/2012 0820   BUN 15.3 05/29/2014 0950   BUN 13 07/23/2012 0820   CREATININE 0.8 05/29/2014 0950   CREATININE 0.80 07/23/2012 0820      Component Value Date/Time   CALCIUM 9.1 05/29/2014 0950   CALCIUM 9.0 07/23/2012 0820   ALKPHOS 51 05/29/2014 0950   ALKPHOS 44 07/23/2012 0820   AST 21 05/29/2014 0950   AST 16 07/23/2012 0820   ALT 25 05/29/2014 0950   ALT 18 07/23/2012 0820   BILITOT 0.47 05/29/2014 0950   BILITOT 0.6 07/23/2012 0820       Lab Results  Component Value Date   WBC 3.5* 05/29/2014   HGB 13.3 05/29/2014   HCT 39.9 05/29/2014   MCV 90.7 05/29/2014   PLT 178 05/29/2014   NEUTROABS 2.0 05/29/2014   Mammogram ASSESSMENT & PLAN:   Malignant neoplasm of upper-outer quadrant of left female breast Left breast DCIS status post lumpectomy followed by adjuvant radiation therapy ER 97% when necessary her percent, margins negative, tamoxifen started January 2013 discontinued September 2014 due to heavy uterine bleeding.  Tamoxifen toxicities: 1. Hot flashes initially very severe but now much improved 2. Muscle cramps: Much improved after she started magnesium supplementation.   Breast cancer surveillance: 1. Breast exam 11/21/2014 normal 2. Mammogram 08/29/2014 normal Continue annual surveillance with mammograms and physical exams. Because she has to be everything out of pocket, she would like to see only one doctor for her annual visits. She canceled the appointment with general surgeon. Return to clinic in 1 year for follow-up     No orders of the defined types were placed in this encounter.   The patient has a good understanding of the overall plan. she agrees with it. She will call with any problems that may develop before her next visit here.   Rulon Eisenmenger, MD 11/21/2014 9:53 AM

## 2015-08-07 ENCOUNTER — Ambulatory Visit (INDEPENDENT_AMBULATORY_CARE_PROVIDER_SITE_OTHER): Payer: 59 | Admitting: Family Medicine

## 2015-08-07 ENCOUNTER — Encounter: Payer: Self-pay | Admitting: Family Medicine

## 2015-08-07 VITALS — BP 118/80 | HR 86 | Temp 98.4°F | Ht 66.25 in | Wt 135.8 lb

## 2015-08-07 DIAGNOSIS — Z7189 Other specified counseling: Secondary | ICD-10-CM

## 2015-08-07 DIAGNOSIS — Z7689 Persons encountering health services in other specified circumstances: Secondary | ICD-10-CM

## 2015-08-07 DIAGNOSIS — C50412 Malignant neoplasm of upper-outer quadrant of left female breast: Secondary | ICD-10-CM | POA: Diagnosis not present

## 2015-08-07 DIAGNOSIS — I868 Varicose veins of other specified sites: Secondary | ICD-10-CM

## 2015-08-07 DIAGNOSIS — M159 Polyosteoarthritis, unspecified: Secondary | ICD-10-CM

## 2015-08-07 DIAGNOSIS — M1990 Unspecified osteoarthritis, unspecified site: Secondary | ICD-10-CM | POA: Insufficient documentation

## 2015-08-07 DIAGNOSIS — I8390 Asymptomatic varicose veins of unspecified lower extremity: Secondary | ICD-10-CM

## 2015-08-07 NOTE — Progress Notes (Signed)
HPI:  Isabel [name] is here to establish care. She sees Dr. Ronita Hipps yearly for women's health. She was seeing Dr. Minna Antis for primary care, but he left. She wants referrals to her oncologist and her general surgeon as has had a change in insurance. She is on several supplements from a chiropractor but is not sure if she needs them. Magnesium really helps when gets leg cramps - now resolved.  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 Lucia Gaskins in gen surgery and to oncology for follow up due to insurance issues -denies breast changes   ROS negative for unless reported above: fevers, unintentional weight loss, hearing or vision loss, chest pain, palpitations, struggling to breath, hemoptysis, melena, hematochezia, hematuria, falls, loc, si, thoughts of self harm  Past Medical History  Diagnosis Date  . Uterine fibroid   . 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 Mitral regurgitation     mild on echo from 2009, no MVP per notes  . Scoliosis   . Breast cancer     sees onc and gen surg   . Varicose veins     goes to [facility], s/p laser and inj tx  . Osteoarthritis     w/ bilat hip resurfacing and possible metal reaction, sees ortho at [facility]    Past Surgical History  Procedure Laterality Date  . Ganglion cyst excision    . Unterine fibroid  1998    removal  . Right  hip resurfaced 2008    . Left hip resurfaced 2011    . Breast lumpectomy  09/12    left breast    Family History  Problem Relation Age of Onset  . Paget's disease of bone Mother   . Breast cancer Maternal Aunt   . Colon cancer Neg Hx   . Stomach cancer Neg Hx   . Hypertension Father   . Hyperlipidemia Father   . CAD Father 66  . Stroke Mother     Social History   Social History  . Marital Status: Married    Spouse Name: N/A  . Number of Children: N/A  . Years of Education: N/A   Occupational History  . Art Director/Artist    Social History Main Topics  . Smoking status: Never Smoker   . Smokeless tobacco: Never Used  . Alcohol Use: 4.2 oz/week    7 Glasses of wine per week  . Drug Use: No  . Sexual Activity: Yes    Birth Control/ Protection: Post-menopausal   Other Topics Concern  . None   Social History Narrative   Updated 08/2015:   Art Director/Artisit - currently unemployed   Married to [employer]   No children   Exercise 2 days per week/ diet healthy   Christian     Current outpatient prescriptions:  .  BIOTIN 5000 PO, Take by mouth., Disp: , Rfl:  .  calcium carbonate 200 MG capsule, Take 200 mg by mouth 2 (two) times daily with a meal. , Disp: , Rfl:  .  Cholecalciferol (VITAMIN D3) 5000 UNITS CAPS, Take 2,000 Int'l Units by mouth daily. , Disp: , Rfl:  .  Magnesium 500 MG CAPS, Take 150 mg by mouth 2 (two) times daily. , Disp: , Rfl:  .  Omega-3 Fatty Acids (FISH OIL) 500 MG CAPS, Take 690 mg by mouth 2 (two) times daily. , Disp: , Rfl:  .  vitamin E 400 UNIT capsule, Take 200 Units by mouth daily. , Disp: , Rfl:   EXAM:  Filed Vitals:   08/07/15 1107  BP: 118/80  Pulse: 86  Temp: 98.4 F (36.9 C)    Body mass index is 21.75 kg/(m^2).  GENERAL: vitals reviewed and listed above, alert, oriented, appears well hydrated and in no acute distress  HEENT: atraumatic, conjunttiva clear, no obvious abnormalities on inspection of external nose and  ears  NECK: no obvious masses on inspection  LUNGS: clear to auscultation bilaterally, no wheezes, rales or rhonchi, good air movement  CV: HRRR, no peripheral edema  MS: moves all extremities without noticeable abnormality  PSYCH: pleasant and cooperative, no obvious depression or anxiety  ASSESSMENT AND PLAN:  Discussed the following assessment and plan:  Varicose veins  Osteoarthritis of multiple joints, unspecified osteoarthritis type  Malignant neoplasm of upper-outer quadrant of left female breast - Plan: Ambulatory referral to General Surgery, Ambulatory referral to Oncology  Encounter to establish care -We reviewed the PMH, PSH, FH, SH, Meds and Allergies. -We provided refills for any medications we will prescribe as needed. -We addressed current concerns per orders and patient instructions. -We have asked for records for pertinent exams, studies, vaccines and notes from previous providers. -We have advised patient to follow up per instructions below.   -Patient advised to return or notify a doctor immediately if symptoms worsen or persist or new concerns arise.  Patient Instructions  BEFORE YOU LEAVE: -schedule physical in 3-4 months  -We placed a referral for you as discussed to your oncologist and surgeon per your request. It usually takes about 1-2 weeks to process and schedule this referral. If you have not heard from [place] regarding this appointment in 2 weeks please contact our office.  -please call to set up follow up with your otheropedic surgeon and let us know if you need a referral  -PLEASE SIGN UP FOR MYCHART TODAY   We recommend the following healthy lifestyle measures: - eat a healthy diet consisting of lots of vegetables, fruits, beans, nuts, seeds, healthy meats such as white chicken and fish and whole grains.  - avoid fried foods, fast food, processed foods, sodas, red meet and other fattening foods.  - get a least 150 minutes of aerobic exercise  per week.         Colin Benton R.

## 2015-08-07 NOTE — Progress Notes (Signed)
Pre visit review using our clinic review tool, if applicable. No additional management support is needed unless otherwise documented below in the visit note. 

## 2015-08-07 NOTE — Patient Instructions (Signed)
BEFORE YOU LEAVE: -schedule physical in 3-4 months  -We placed a referral for you as discussed to your oncologist and surgeon per your request. It usually takes about 1-2 weeks to process and schedule this referral. If you have not heard from [place] regarding this appointment in 2 weeks please contact our office.  -please call to set up follow up with your otheropedic surgeon and let us know if you need a referral  -PLEASE SIGN UP FOR MYCHART TODAY   We recommend the following healthy lifestyle measures: - eat a healthy diet consisting of lots of vegetables, fruits, beans, nuts, seeds, healthy meats such as white chicken and fish and whole grains.  - avoid fried foods, fast food, processed foods, sodas, red meet and other fattening foods.  - get a least 150 minutes of aerobic exercise per week.

## 2015-08-17 ENCOUNTER — Other Ambulatory Visit: Payer: Self-pay

## 2015-08-17 DIAGNOSIS — C50412 Malignant neoplasm of upper-outer quadrant of left female breast: Secondary | ICD-10-CM

## 2015-08-17 MED ORDER — TAMOXIFEN CITRATE 20 MG PO TABS: 20.0000 mg | ORAL_TABLET | Freq: Every day | ORAL | Status: DC

## 2015-08-17 NOTE — Telephone Encounter (Signed)
Returned pt call re: tamoxifen refill.  Let pt know the [facility] request wasn't filled because Dr. Maudie Mercury had discontinued it off her med list.  Let her know that I would resend prescription to [facility] and provided next appt d/t.  Pt voiced understanding.

## 2015-10-16 ENCOUNTER — Encounter: Payer: Self-pay | Admitting: *Deleted

## 2015-10-16 ENCOUNTER — Telehealth: Payer: Self-pay | Admitting: Family Medicine

## 2015-10-16 NOTE — Telephone Encounter (Signed)
I called the pt and she denied any of the problems listed below.  I faxed the letter to Dr Lacey Jensen.

## 2015-10-16 NOTE — Telephone Encounter (Signed)
Pt states she just got braces and the ortho is recommending her to take an antibiotic every time she gets an adjustment. Due to the heart murmer pt was diagnosed with >3 yrs ago. But pt does not want to take an antibiotic every time she gets an adjustment.  (which would be every 4-6 wks.) Pt states she has no heart condition that needs to be monitored In order for orthodontist to be covered , she would like a letter faxed to her stating ok that pt not take antibiotic at every visit.  Dr Mayer Masker [address], [place], [place] [address] Phone [phone] Fax:  [phone]

## 2015-10-16 NOTE — Telephone Encounter (Signed)
Please review with patient to see if she has any of the following (I have only seen her once and am not aware of her having any of these):  * prosthetic cardiac valve or prosthetic material used for cardiac valve repair a history of infective endocarditis a cardiac transplant that develops cardiac valvulopathy the following congenital (present from birth) heart disease:a   unrepaired cyanotic congenital heart disease, including palliative shunts and conduits   a completely repaired congenital heart defect with prosthetic material or device, whether placed by surgery or by catheter intervention, during the first six months after the procedureb   any repaired congenital heart defect with residual defect at the site or adjacent to the site of a prosthetic patch or a prosthetic device (that inhibit endothelialization)  In not, please write letter:  I am not aware of any heart condition in this patient that would indicate prophylactic antibiotics for cardiac reasons for routine dental procedures based on the current ADA guidelines. I advise that based on the type of procedure you plan to do that you review the ADA guidelines, discuss risks versus benefits of prophylactic antibiotics and then make a decision to treat with antibiotics depending on your concern for infections related to the procedure you are doing. Thank you.

## 2015-10-27 ENCOUNTER — Telehealth: Payer: Self-pay | Admitting: Family Medicine

## 2015-10-27 DIAGNOSIS — R238 Other skin changes: Secondary | ICD-10-CM

## 2015-10-27 NOTE — Telephone Encounter (Signed)
Patient left a message on my voicemail stating she is not having any problems but needs to see Dr Tonia Brooms for a skin check.  Referral entered.

## 2015-10-27 NOTE — Telephone Encounter (Signed)
Ms. Boley called saying she needs a referral to [facility], Dermatologist before the end of the year. She has [facility]. She was told they have an appt today and possibly on December 27th. Please give her a call if she needs to come in before or if you have questions.   Pt ph# [phone] Thank you.

## 2015-10-27 NOTE — Telephone Encounter (Signed)
Please find out dx for referral and ok to place.

## 2015-10-27 NOTE — Telephone Encounter (Signed)
Left a detailed message for the pt to return the call with a diagnosis for appt.

## 2015-11-22 NOTE — Assessment & Plan Note (Signed)
Left breast DCIS status post lumpectomy followed by adjuvant radiation therapy ER 97% when necessary her percent, margins negative, tamoxifen started January 2013 discontinued September 2014 due to heavy uterine bleeding.  Tamoxifen toxicities: 1. Hot flashes initially very severe but now much improved 2. Muscle cramps: Much improved after she started magnesium supplementation.   Breast cancer surveillance: 1. Breast exam 11/23/2015 normal 2. Mammogram 08/29/2014 normal  Continue annual surveillance with mammograms and physical exams. Because she has to be everything out of pocket, she would like to see only one doctor for her annual visits. She canceled the appointment with general surgeon.  Return to clinic in 1 year for follow-up

## 2015-11-23 ENCOUNTER — Encounter: Payer: Self-pay | Admitting: Hematology and Oncology

## 2015-11-23 ENCOUNTER — Ambulatory Visit (HOSPITAL_BASED_OUTPATIENT_CLINIC_OR_DEPARTMENT_OTHER): Payer: 59 | Admitting: Hematology and Oncology

## 2015-11-23 ENCOUNTER — Telehealth: Payer: Self-pay | Admitting: Hematology and Oncology

## 2015-11-23 VITALS — BP 133/67 | HR 77 | Temp 98.2°F | Resp 18 | Ht 66.25 in | Wt 143.1 lb

## 2015-11-23 DIAGNOSIS — R252 Cramp and spasm: Secondary | ICD-10-CM | POA: Diagnosis not present

## 2015-11-23 DIAGNOSIS — D0512 Intraductal carcinoma in situ of left breast: Secondary | ICD-10-CM | POA: Diagnosis not present

## 2015-11-23 DIAGNOSIS — C50412 Malignant neoplasm of upper-outer quadrant of left female breast: Secondary | ICD-10-CM

## 2015-11-23 NOTE — Progress Notes (Signed)
Patient Care Team: Lucretia Kern, DO as PCP - General (Family Medicine) Brien Few, MD as Consulting Physician (Obstetrics and Gynecology) Thea Silversmith, MD as Consulting Physician (Radiation Oncology) Sallyanne Havers, MD as Consulting Physician (Orthopedic Surgery) Alphonsa Overall, MD (General Surgery) Consuela Mimes, MD as Consulting Physician (Hematology and Oncology)  DIAGNOSIS: Malignant neoplasm of upper-outer quadrant of left female breast [facility])   Staging form: Breast, AJCC 7th Edition     Pathologic: Stage 0 (Tis, N0, cM0) - Signed by Thea Silversmith, MD on 10/13/2011   SUMMARY OF ONCOLOGIC HISTORY:   Malignant neoplasm of upper-outer quadrant of left female breast ([place])   08/09/2011 Initial Diagnosis Left breast biopsy: high-grade ductal carcinoma in situ, with comedonecrosis and calcification, ER +97%, PR +100%.   08/25/2011 Surgery Left breast lumpectomy:stage 0, pTis, PNX, for a high-grade DCIS with calcifications and comedo necrosis. No lymph nodes were examined, margins were not involved.   10/13/2011 - 11/30/2011 Radiation Therapy Adjuvant radiation therapy   12/13/2011 - 09/02/2013 Anti-estrogen oral therapy Tamoxifen 20 mg daily discontinued due to uterine bleeding    CHIEF COMPLIANT: Follow-up of DCIS on tamoxifen  INTERVAL HISTORY: Isabel [name] is a 59 year old with above-mentioned history of left breast DCIS treated with lumpectomy and radiation and is now on tamoxifen therapy. She took it intermittently and discontinued briefly for uterine bleeding in 2014. She resumed it and is now taking it somewhat intermittently for the past 6 months. She gets occasional palpitations and severe hot flashes. During these episodes she quits taking it for a week at a time.  REVIEW OF SYSTEMS:   Constitutional: Denies fevers, chills or abnormal weight loss Eyes: Denies blurriness of vision Ears, nose, mouth, throat, and face: Denies mucositis or sore throat Respiratory: Denies  cough, dyspnea or wheezes Cardiovascular: Denies palpitation, chest discomfort or lower extremity swelling Gastrointestinal:  Denies nausea, heartburn or change in bowel habits Skin: Denies abnormal skin rashes Lymphatics: Denies new lymphadenopathy or easy bruising Neurological:Denies numbness, tingling or new weaknesses Behavioral/Psych: Mood is stable, no new changes  Breast: Left breast lumpectomy site is nodular and tender All other systems were reviewed with the patient and are negative.  I have reviewed the past medical history, past surgical history, social history and family history with the patient and they are unchanged from previous note.  ALLERGIES:  is allergic to erythromycin.  MEDICATIONS:  Current Outpatient Prescriptions  Medication Sig Dispense Refill  . BIOTIN 5000 PO Take by mouth.    . calcium carbonate 200 MG capsule Take 200 mg by mouth 2 (two) times daily with a meal.     . Cholecalciferol (VITAMIN D3) 5000 UNITS CAPS Take 2,000 Int'l Units by mouth daily.     . Magnesium 500 MG CAPS Take 150 mg by mouth 2 (two) times daily.     . Omega-3 Fatty Acids (FISH OIL) 500 MG CAPS Take 690 mg by mouth 2 (two) times daily.     . tamoxifen (NOLVADEX) 20 MG tablet Take 1 tablet (20 mg total) by mouth daily. 30 tablet 5  . vitamin E 400 UNIT capsule Take 200 Units by mouth daily.      No current facility-administered medications for this visit.    PHYSICAL EXAMINATION: ECOG PERFORMANCE STATUS: 1 - Symptomatic but completely ambulatory  Filed Vitals:   11/23/15 0911  BP: 133/67  Pulse: 77  Temp: 98.2 F (36.8 C)  Resp: 18   Filed Weights   11/23/15 0911  Weight: 143 lb 1.6 oz (  64.91 kg)    GENERAL:alert, no distress and comfortable SKIN: skin color, texture, turgor are normal, no rashes or significant lesions EYES: normal, Conjunctiva are pink and non-injected, sclera clear OROPHARYNX:no exudate, no erythema and lips, buccal mucosa, and tongue normal  NECK:  supple, thyroid normal size, non-tender, without nodularity LYMPH:  no palpable lymphadenopathy in the cervical, axillary or inguinal LUNGS: clear to auscultation and percussion with normal breathing effort HEART: regular rate & rhythm and no murmurs and no lower extremity edema ABDOMEN:abdomen soft, non-tender and normal bowel sounds Musculoskeletal:no cyanosis of digits and no clubbing  NEURO: alert & oriented x 3 with fluent speech, no focal motor/sensory deficits BREAST: Left breast palpable nodule in the upper outer quadrant where she had surgery and radiation.. (exam performed in the presence of a chaperone)  LABORATORY DATA:  I have reviewed the data as listed   Chemistry      Component Value Date/Time   NA 140 05/29/2014 0950   NA 141 07/23/2012 0820   K 4.3 05/29/2014 0950   K 4.2 07/23/2012 0820   CL 105 03/01/2013 1020   CL 106 07/23/2012 0820   CO2 27 05/29/2014 0950   CO2 28 07/23/2012 0820   BUN 15.3 05/29/2014 0950   BUN 13 07/23/2012 0820   CREATININE 0.8 05/29/2014 0950   CREATININE 0.80 07/23/2012 0820      Component Value Date/Time   CALCIUM 9.1 05/29/2014 0950   CALCIUM 9.0 07/23/2012 0820   ALKPHOS 51 05/29/2014 0950   ALKPHOS 44 07/23/2012 0820   AST 21 05/29/2014 0950   AST 16 07/23/2012 0820   ALT 25 05/29/2014 0950   ALT 18 07/23/2012 0820   BILITOT 0.47 05/29/2014 0950   BILITOT 0.6 07/23/2012 0820       Lab Results  Component Value Date   WBC 3.5* 05/29/2014   HGB 13.3 05/29/2014   HCT 39.9 05/29/2014   MCV 90.7 05/29/2014   PLT 178 05/29/2014   NEUTROABS 2.0 05/29/2014   ASSESSMENT & PLAN:  Malignant neoplasm of upper-outer quadrant of left female breast Left breast DCIS status post lumpectomy followed by adjuvant radiation therapy ER 97% when necessary her percent, margins negative, tamoxifen started January 2013 discontinued September 2014 due to heavy uterine bleeding.  Tamoxifen toxicities: 1. Hot flashes initially very  severe.this seemed to come and go and interfere with her quality of life. She has been taking tamoxifen intermittently. I discussed with her that her risk of recurrence from DCIS is generally low and that having taken it for 4 years, if she wanted to she can discontinue it for lifestyle reasons. She wishes to continue with it intermittently as she is currently taking for the next year and then stopped at that time.    2. Muscle cramps: Much improved after she started magnesium supplementation.   Breast cancer surveillance: 1. Breast exam 11/23/2015 normal 2. MammograSeptember 2016 at [place] was apparently normal. We will request a copy of this report. [facility] ultrasound as well as that time looking for an abnormality noted on the mammogram but was not noticed on the ultrasound.   we discussed if the pain gets worse in the left breast area she will call and make an appointment to see Dr. Lucia Gaskins.  Continue annual surveillance with mammograms and physical exams.  Return to clinic in 1 year for follow-up   No orders of the defined types were placed in this encounter.   The patient has a good understanding of the overall plan.  she agrees with it. she will call with any problems that may develop before the next visit here.   Rulon Eisenmenger, MD 11/23/2015

## 2015-11-23 NOTE — Telephone Encounter (Signed)
Appointments made and avs printed for patient °

## 2015-11-26 ENCOUNTER — Encounter: Payer: 59 | Admitting: Family Medicine

## 2015-12-02 ENCOUNTER — Ambulatory Visit (INDEPENDENT_AMBULATORY_CARE_PROVIDER_SITE_OTHER): Payer: 59 | Admitting: Family Medicine

## 2015-12-02 ENCOUNTER — Encounter: Payer: Self-pay | Admitting: Family Medicine

## 2015-12-02 VITALS — BP 136/88 | HR 69 | Temp 98.9°F | Ht 66.75 in | Wt 140.5 lb

## 2015-12-02 DIAGNOSIS — Z0000 Encounter for general adult medical examination without abnormal findings: Secondary | ICD-10-CM

## 2015-12-02 DIAGNOSIS — M159 Polyosteoarthritis, unspecified: Secondary | ICD-10-CM | POA: Diagnosis not present

## 2015-12-02 DIAGNOSIS — C50412 Malignant neoplasm of upper-outer quadrant of left female breast: Secondary | ICD-10-CM | POA: Diagnosis not present

## 2015-12-02 LAB — COMPREHENSIVE METABOLIC PANEL
ALT: 18 U/L (ref 0–35)
AST: 18 U/L (ref 0–37)
Albumin: 4.3 g/dL (ref 3.5–5.2)
Alkaline Phosphatase: 48 U/L (ref 39–117)
BILIRUBIN TOTAL: 0.5 mg/dL (ref 0.2–1.2)
BUN: 12 mg/dL (ref 6–23)
CALCIUM: 9.5 mg/dL (ref 8.4–10.5)
CO2: 28 meq/L (ref 19–32)
Chloride: 106 mEq/L (ref 96–112)
Creatinine, Ser: 0.72 mg/dL (ref 0.40–1.20)
GFR: 88.43 mL/min (ref >60.00)
Glucose, Bld: 99 mg/dL (ref 70–99)
Potassium: 5 mEq/L (ref 3.5–5.1)
Sodium: 141 mEq/L (ref 135–145)
Total Protein: 6.6 g/dL (ref 6.0–8.3)

## 2015-12-02 LAB — CBC WITH DIFFERENTIAL/PLATELET
BASOS PCT: 0.6 % (ref 0.0–3.0)
Basophils Absolute: 0 10*3/uL (ref 0.0–0.1)
EOS ABS: 0.1 10*3/uL (ref 0.0–0.7)
Eosinophils Relative: 2.6 % (ref 0.0–5.0)
HEMATOCRIT: 39.8 % (ref 36.0–46.0)
Hemoglobin: 13.3 g/dL (ref 12.0–15.0)
LYMPHS PCT: 23.4 % (ref 12.0–46.0)
Lymphs Abs: 1.2 10*3/uL (ref 0.7–4.0)
MCHC: 33.4 g/dL (ref 30.0–36.0)
MCV: 90.5 fl (ref 78.0–100.0)
MONOS PCT: 9.5 % (ref 3.0–12.0)
Monocytes Absolute: 0.5 10*3/uL (ref 0.1–1.0)
NEUTROS ABS: 3.2 10*3/uL (ref 1.4–7.7)
Neutrophils Relative %: 63.9 % (ref 43.0–77.0)
PLATELETS: 214 10*3/uL (ref 150.0–400.0)
RBC: 4.39 Mil/uL (ref 3.87–5.11)
RDW: 13.5 % (ref 11.5–15.5)
WBC: 4.9 10*3/uL (ref 4.0–10.5)

## 2015-12-02 LAB — LIPID PANEL
CHOL/HDL RATIO: 2
Cholesterol: 161 mg/dL (ref 0–200)
HDL: 86.9 mg/dL (ref >39.00)
LDL Cholesterol: 62 mg/dL (ref 0–99)
NonHDL: 74.13
TRIGLYCERIDES: 60 mg/dL (ref 0.0–149.0)
VLDL: 12 mg/dL (ref 0.0–40.0)

## 2015-12-02 LAB — VITAMIN D 25 HYDROXY (VIT D DEFICIENCY, FRACTURES): VITD: 36.82 ng/mL (ref 30.00–100.00)

## 2015-12-02 LAB — HEMOGLOBIN A1C: Hgb A1c MFr Bld: 5.5 % (ref 4.6–6.5)

## 2015-12-02 NOTE — Progress Notes (Signed)
Pre visit review using our clinic review tool, if applicable. No additional management support is needed unless otherwise documented below in the visit note. 

## 2015-12-02 NOTE — Progress Notes (Signed)
HPI:  Here for CPE:  -Concerns and/or follow up today:   Varicose veins: -chronic -reports treated at [place] [facility] with laser and injections and is doing well -denies pain, swelling  Hx Vit D def: -taking daily Vit D -wants to recheck  OA s/p L hip resurfacing and possible reaction to metal: -with elevated cobalt and chromium and chronic pain -managed by orthopedic surgery at [facility] - her doc retired and she has been feeling well so has not followed up in recent months  Hx Breast Ca: -dx 2012, dcic, er/pr + s/p radiation, lumpectomy -managed by oncologist -on tamaxifen intermittently since 12/2011  -Diet: variety of foods, balance and well rounded, larger portion sizes  -Exercise: no regular exercise  -Taking folic acid, vitamin D or calcium: no  -Diabetes and Dyslipidemia Screening:  -Hx of HTN: no  -Vaccines: refused flu vaccine  -pap history: sees Dr. Ronita Hipps yearly  -sexual activity: yes, female partner, no new partners  -wants STI testing (Hep C if born [birth year]): want hep c testing - husband with hep c, declined other testing  -FH breast, colon or ovarian ca: see FH Last mammogram: yearly, utd Last colon cancer screening: UTD per chart  Breast Ca Risk Assessment: -sees gyn and onc for breast health  Saw dermatologist for full skin check recently  -Alcohol, Tobacco, drug use: see social history  Review of Systems - no fevers, unintentional weight loss, vision loss, hearing loss, chest pain, sob, hemoptysis, melena, hematochezia, hematuria, genital discharge, changing or concerning skin lesions, bleeding, bruising, loc, thoughts of self harm or SI  Past Medical History  Diagnosis Date  . Uterine fibroid   . 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 Mitral regurgitation     mild on echo from 2009, no MVP per notes  . Scoliosis   . Breast cancer ([place])     sees onc and gen surg   . Varicose veins     goes to [place] [facility], s/p  laser and inj tx  . Osteoarthritis     w/ bilat hip resurfacing and possible metal reaction, sees ortho at [facility]    Past Surgical History  Procedure Laterality Date  . Ganglion cyst excision    . Unterine fibroid  1998    removal  . Right hip resurfaced 2008    . Left hip resurfaced 2011    . Breast lumpectomy  09/12    left breast    Family History  Problem Relation Age of Onset  . Paget's disease of bone Mother   . Breast cancer Maternal Aunt   . Colon cancer Neg Hx   . Stomach cancer Neg Hx   . Hypertension Father   . Hyperlipidemia Father   . CAD Father 33  . Stroke Mother     Social History   Social History  . Marital Status: Married    Spouse Name: N/A  . Number of Children: N/A  . Years of Education: N/A   Occupational History  . Art Director/Artist    Social History Main Topics  . Smoking status: Never Smoker   . Smokeless tobacco: Never Used  . Alcohol Use: 4.2 oz/week    7 Glasses of wine per week  . Drug Use: No  . Sexual Activity: Yes    Birth Control/ Protection: Post-menopausal   Other Topics Concern  . None   Social History Narrative   Updated 08/2015:   Art Director/Artisit - currently unemployed  Married to Legrand Como   No children   Exercise 2 days per week/ diet healthy   Christian     Current outpatient prescriptions:  .  BIOTIN 5000 PO, Take by mouth., Disp: , Rfl:  .  calcium carbonate 200 MG capsule, Take 200 mg by mouth 2 (two) times daily with a meal. , Disp: , Rfl:  .  Cholecalciferol (VITAMIN D3) 5000 UNITS CAPS, Take 2,000 Int'l Units by mouth daily. , Disp: , Rfl:  .  Magnesium 500 MG CAPS, Take 150 mg by mouth 2 (two) times daily. , Disp: , Rfl:  .  Omega-3 Fatty Acids (FISH OIL) 500 MG CAPS, Take 690 mg by mouth 2 (two) times daily. , Disp: , Rfl:  .  tamoxifen (NOLVADEX) 20 MG tablet, Take 1 tablet (20 mg total) by mouth daily., Disp: 30 tablet, Rfl: 5 .  vitamin E 400 UNIT capsule, Take 200 Units by mouth daily. ,  Disp: , Rfl:   EXAM:  Filed Vitals:   12/02/15 1009  BP: 136/88  Pulse: 69  Temp: 98.9 F (37.2 C)    GENERAL: vitals reviewed and listed below, alert, oriented, appears well hydrated and in no acute distress  HEENT: head atraumatic, PERRLA, normal appearance of eyes, ears, nose and mouth. moist mucus membranes.  NECK: supple, no masses or lymphadenopathy  LUNGS: clear to auscultation bilaterally, no rales, rhonchi or wheeze  CV: HRRR, no peripheral edema or cyanosis, normal pedal pulses  BREAST: declined  ABDOMEN: bowel sounds normal, soft, non tender to palpation, no masses, no rebound or guarding  GU: declined  SKIN: declined  MS: normal gait, moves all extremities normally  NEURO: CN II-XII grossly intact, normal muscle strength and sensation to light touch on extremities  PSYCH: normal affect, pleasant and cooperative  ASSESSMENT AND PLAN:  Discussed the following assessment and plan:  Visit for preventive health examination - Plan: Lipid Panel, Hemoglobin A1c -Discussed and advised all [place] preventive services health task force level A and B recommendations for age, sex and risks. -Advised at least 150 minutes of exercise per week and a healthy diet low in saturated fats and sweets and consisting of fresh fruits and vegetables, lean meats such as fish and white chicken and whole grains. -labs, studies and vaccines per orders this encounter  Malignant neoplasm of upper-outer quadrant of left female breast ([place]) - Plan: CBC with Differential, CMP -labs, on tamoxifen, doing well, saw onc recently and sees gyn for exams  Osteoarthritis of multiple joints, unspecified osteoarthritis type - Plan: Vitamin D, 25-hydroxy -I advised follow up with ortho promptly to monitor and treat and she agrees to call today to set this up.    Orders Placed This Encounter  Procedures  . CBC with Differential  . CMP  . Lipid Panel  . Hemoglobin A1c  . Vitamin D, 25-hydroxy     Patient advised to return to clinic immediately if symptoms worsen or persist or new concerns.  Patient Instructions  BEFORE YOU LEAVE: -labs -follow up yearly  Please call your orthopedic doctor today to schedule follow up.  -We have ordered labs or studies at this visit. It can take up to 1-2 weeks for results and processing. We will contact you with instructions IF your results are abnormal. Normal results will be released to your [facility]. If you have not heard from [place] or can not find your results in [facility] in 2 weeks please contact our office.  We recommend the following healthy lifestyle  measures: - eat a healthy whole foods diet consisting of regular small meals composed of vegetables, fruits, beans, nuts, seeds, healthy meats such as white chicken and fish and whole grains.  - avoid sweets, white starchy foods, fried foods, fast food, processed foods, sodas, red meet and other fattening foods.  - get a least 150-300 minutes of aerobic exercise per week.           No Follow-up on file.  Colin Benton R.

## 2015-12-02 NOTE — Patient Instructions (Signed)
BEFORE YOU LEAVE: -labs -follow up yearly  Please call your orthopedic doctor today to schedule follow up.  -We have ordered labs or studies at this visit. It can take up to 1-2 weeks for results and processing. We will contact you with instructions IF your results are abnormal. Normal results will be released to your [facility]. If you have not heard from [place] or can not find your results in [facility] in 2 weeks please contact our office.  We recommend the following healthy lifestyle measures: - eat a healthy whole foods diet consisting of regular small meals composed of vegetables, fruits, beans, nuts, seeds, healthy meats such as white chicken and fish and whole grains.  - avoid sweets, white starchy foods, fried foods, fast food, processed foods, sodas, red meet and other fattening foods.  - get a least 150-300 minutes of aerobic exercise per week.

## 2016-07-26 DIAGNOSIS — R5383 Other fatigue: Secondary | ICD-10-CM | POA: Diagnosis not present

## 2016-07-29 DIAGNOSIS — C50919 Malignant neoplasm of unspecified site of unspecified female breast: Secondary | ICD-10-CM | POA: Diagnosis not present

## 2016-07-29 DIAGNOSIS — N951 Menopausal and female climacteric states: Secondary | ICD-10-CM | POA: Diagnosis not present

## 2016-07-29 DIAGNOSIS — R5381 Other malaise: Secondary | ICD-10-CM | POA: Diagnosis not present

## 2016-09-06 ENCOUNTER — Encounter: Payer: Self-pay | Admitting: Family Medicine

## 2016-09-06 DIAGNOSIS — Z853 Personal history of malignant neoplasm of breast: Secondary | ICD-10-CM | POA: Diagnosis not present

## 2016-09-06 DIAGNOSIS — N6320 Unspecified lump in the left breast, unspecified quadrant: Secondary | ICD-10-CM | POA: Diagnosis not present

## 2016-09-08 DIAGNOSIS — R5383 Other fatigue: Secondary | ICD-10-CM | POA: Diagnosis not present

## 2016-11-22 ENCOUNTER — Ambulatory Visit (HOSPITAL_BASED_OUTPATIENT_CLINIC_OR_DEPARTMENT_OTHER): Payer: BLUE CROSS/BLUE SHIELD | Admitting: Hematology and Oncology

## 2016-11-22 ENCOUNTER — Encounter: Payer: Self-pay | Admitting: Hematology and Oncology

## 2016-11-22 DIAGNOSIS — C50412 Malignant neoplasm of upper-outer quadrant of left female breast: Secondary | ICD-10-CM

## 2016-11-22 DIAGNOSIS — Z170 Estrogen receptor positive status [ER+]: Secondary | ICD-10-CM | POA: Diagnosis not present

## 2016-11-22 DIAGNOSIS — D0512 Intraductal carcinoma in situ of left breast: Secondary | ICD-10-CM

## 2016-11-22 NOTE — Progress Notes (Signed)
Patient Care Team: Lucretia Kern, DO as PCP - General (Family Medicine) Brien Few, MD as Consulting Physician (Obstetrics and Gynecology) Thea Silversmith, MD as Consulting Physician (Radiation Oncology) Sallyanne Havers, MD as Consulting Physician (Orthopedic Surgery) Alphonsa Overall, MD (General Surgery) Marcy Panning, MD as Consulting Physician (Hematology and Oncology)  DIAGNOSIS:  Encounter Diagnosis  Name Primary?  . Malignant neoplasm of upper-outer quadrant of left breast in female, estrogen receptor positive ([place])     SUMMARY OF ONCOLOGIC HISTORY:   Malignant neoplasm of upper-outer quadrant of left female breast ([place])   08/09/2011 Initial Diagnosis    Left breast biopsy: high-grade ductal carcinoma in situ, with comedonecrosis and calcification, ER +97%, PR +100%.      08/25/2011 Surgery    Left breast lumpectomy:stage 0, pTis, PNX, for a high-grade DCIS with calcifications and comedo necrosis. No lymph nodes were examined, margins were not involved.      10/13/2011 - 11/30/2011 Radiation Therapy    Adjuvant radiation therapy      12/13/2011 - 11/23/2015 Anti-estrogen oral therapy    Tamoxifen 20 mg daily discontinued due to uterine bleeding 09/02/2013, later resumed it and took it intermittently       CHIEF COMPLIANT: Follow-up of DCIS on tamoxifen  INTERVAL HISTORY: Isabel [name] is a 60 year old with above-mentioned history of left breast DCIS treated with lumpectomy and radiation andTook tamoxifen intermittently and stop therapy as of December 2016. She continues to get moderate to severe hot flashes. Mild discomfort in the breast along the surgical site  REVIEW OF SYSTEMS:   Constitutional: Denies fevers, chills or abnormal weight loss Eyes: Denies blurriness of vision Ears, nose, mouth, throat, and face: Denies mucositis or sore throat Respiratory: Denies cough, dyspnea or wheezes Cardiovascular: Denies palpitation, chest discomfort Gastrointestinal:   Denies nausea, heartburn or change in bowel habits Skin: Denies abnormal skin rashes Lymphatics: Denies new lymphadenopathy or easy bruising Neurological:Denies numbness, tingling or new weaknesses Behavioral/Psych: Mood is stable, no new changes  Extremities: No lower extremity edema Breast:  Intermittent breast pain at the surgical site All other systems were reviewed with the patient and are negative.  I have reviewed the past medical history, past surgical history, social history and family history with the patient and they are unchanged from previous note.  ALLERGIES:  is allergic to erythromycin.  MEDICATIONS:  Current Outpatient Prescriptions  Medication Sig Dispense Refill  . BIOTIN 5000 PO Take by mouth.    . calcium carbonate 200 MG capsule Take 200 mg by mouth 2 (two) times daily with a meal.     . Cholecalciferol (VITAMIN D3) 5000 UNITS CAPS Take 2,000 Int'l Units by mouth daily.     . Magnesium 500 MG CAPS Take 150 mg by mouth 2 (two) times daily.     . Omega-3 Fatty Acids (FISH OIL) 500 MG CAPS Take 690 mg by mouth 2 (two) times daily.     . tamoxifen (NOLVADEX) 20 MG tablet Take 1 tablet (20 mg total) by mouth daily. 30 tablet 5  . vitamin E 400 UNIT capsule Take 200 Units by mouth daily.      No current facility-administered medications for this visit.     PHYSICAL EXAMINATION: ECOG PERFORMANCE STATUS: 0 - Asymptomatic  Vitals:   11/22/16 1001  BP: 123/70  Pulse: 77  Resp: 18  Temp: 98.2 F (36.8 C)   Filed Weights   11/22/16 1001  Weight: 145 lb 1.6 oz (65.8 kg)    GENERAL:alert, no distress  and comfortable SKIN: skin color, texture, turgor are normal, no rashes or significant lesions EYES: normal, Conjunctiva are pink and non-injected, sclera clear OROPHARYNX:no exudate, no erythema and lips, buccal mucosa, and tongue normal  NECK: supple, thyroid normal size, non-tender, without nodularity LYMPH:  no palpable lymphadenopathy in the cervical, axillary  or inguinal LUNGS: clear to auscultation and percussion with normal breathing effort HEART: regular rate & rhythm and no murmurs and no lower extremity edema ABDOMEN:abdomen soft, non-tender and normal bowel sounds MUSCULOSKELETAL:no cyanosis of digits and no clubbing  NEURO: alert & oriented x 3 with fluent speech, no focal motor/sensory deficits EXTREMITIES: No lower extremity edema BREAST: No palpable masses or nodules in either right or left breasts. No palpable axillary supraclavicular or infraclavicular adenopathy no breast tenderness or nipple discharge. (exam performed in the presence of a chaperone)  LABORATORY DATA:  I have reviewed the data as listed   Chemistry      Component Value Date/Time   NA 141 12/02/2015 1041   NA 140 05/29/2014 0950   K 5.0 12/02/2015 1041   K 4.3 05/29/2014 0950   CL 106 12/02/2015 1041   CL 105 03/01/2013 1020   CO2 28 12/02/2015 1041   CO2 27 05/29/2014 0950   BUN 12 12/02/2015 1041   BUN 15.3 05/29/2014 0950   CREATININE 0.72 12/02/2015 1041   CREATININE 0.8 05/29/2014 0950      Component Value Date/Time   CALCIUM 9.5 12/02/2015 1041   CALCIUM 9.1 05/29/2014 0950   ALKPHOS 48 12/02/2015 1041   ALKPHOS 51 05/29/2014 0950   AST 18 12/02/2015 1041   AST 21 05/29/2014 0950   ALT 18 12/02/2015 1041   ALT 25 05/29/2014 0950   BILITOT 0.5 12/02/2015 1041   BILITOT 0.47 05/29/2014 0950       Lab Results  Component Value Date   WBC 4.9 12/02/2015   HGB 13.3 12/02/2015   HCT 39.8 12/02/2015   MCV 90.5 12/02/2015   PLT 214.0 12/02/2015   NEUTROABS 3.2 12/02/2015    ASSESSMENT & PLAN:  Malignant neoplasm of upper-outer quadrant of left female breast Left breast DCIS status post lumpectomy followed by adjuvant radiation therapy ER 97% when necessary her percent, margins negative, tamoxifen started January 2013 discontinued September 2014 due to heavy uterine bleeding.  Tamoxifen toxicities: 1. Hot flashesBecause of severe hot  flashes she stop tamoxifen December 2016. 2. Muscle cramps: Much improved after she started magnesium supplementation.   Since she completed tamoxifen therapy, I recommended that she can follow once a year with survivorship clinic.  Breast cancer surveillance: 1. Breast exam 11/22/2016 normal 2. Mammogram: September 2017 at [facility] is normal.   Orders Placed This Encounter  Procedures  . Amb Referral to Survivorship Long term    Referral Priority:   Routine    Referral Type:   Consultation    Number of Visits Requested:   1   The patient has a good understanding of the overall plan. she agrees with it. she will call with any problems that may develop before the next visit here.   Rulon Eisenmenger, MD 11/22/16

## 2016-11-22 NOTE — Assessment & Plan Note (Signed)
Left breast DCIS status post lumpectomy followed by adjuvant radiation therapy ER 97% when necessary her percent, margins negative, tamoxifen started January 2013 discontinued September 2014 due to heavy uterine bleeding.  Tamoxifen toxicities: 1. Hot flashes initially very severe.this seemed to come and go and interfere with her quality of life. She has been taking tamoxifen intermittently. I discussed with her that her risk of recurrence from DCIS is generally low and that having taken it for 4 years, if she wanted to she can discontinue it for lifestyle reasons. She wishes to continue with it intermittently as she is currently taking for the next year and then stopped at that time.    2. Muscle cramps: Much improved after she started magnesium supplementation.   Breast cancer surveillance: 1. Breast exam 11/22/2016 normal 2. Mammogram: September 2017 at [facility] was apparently normal.

## 2016-12-14 DIAGNOSIS — R5383 Other fatigue: Secondary | ICD-10-CM | POA: Diagnosis not present

## 2016-12-14 DIAGNOSIS — J309 Allergic rhinitis, unspecified: Secondary | ICD-10-CM | POA: Diagnosis not present

## 2016-12-15 ENCOUNTER — Other Ambulatory Visit: Payer: Self-pay | Admitting: Family Medicine

## 2016-12-15 DIAGNOSIS — E041 Nontoxic single thyroid nodule: Secondary | ICD-10-CM

## 2016-12-19 ENCOUNTER — Ambulatory Visit
Admission: RE | Admit: 2016-12-19 | Discharge: 2016-12-19 | Disposition: A | Discharge status: Home / Self Care | Payer: BLUE CROSS/BLUE SHIELD | Source: Ambulatory Visit | Attending: Family Medicine | Admitting: Family Medicine

## 2016-12-19 DIAGNOSIS — E042 Nontoxic multinodular goiter: Secondary | ICD-10-CM | POA: Diagnosis not present

## 2016-12-19 DIAGNOSIS — E041 Nontoxic single thyroid nodule: Secondary | ICD-10-CM

## 2016-12-23 ENCOUNTER — Telehealth: Payer: Self-pay | Admitting: Family Medicine

## 2016-12-23 DIAGNOSIS — E041 Nontoxic single thyroid nodule: Secondary | ICD-10-CM

## 2016-12-23 NOTE — Telephone Encounter (Signed)
° ° ° ° °  Pt said Dr Karle Starch of [facility] sent pt to have a [place] on her thyroid. They did find a nodule and she said they told her she needed to have a BX on the nodule. She called today to ask if Dr Maudie Mercury would order the [place] at [facility]

## 2016-12-26 NOTE — Telephone Encounter (Signed)
I called the pt and informed her of the message below and she is aware the referral was entered to see the endocrinologist and someone will call her with an appt.

## 2016-12-26 NOTE — Telephone Encounter (Signed)
Please call pt. Sorry to hear about this and hope she is well. Typically I will have pts see our endocrinologist for this as other testing is sometimes needed. Would she be ok with [place] placing this referral instead? Ok to refer to endo (STAT) if she wishes. Thank you.

## 2017-01-11 ENCOUNTER — Encounter: Payer: Self-pay | Admitting: Endocrinology

## 2017-01-11 ENCOUNTER — Ambulatory Visit (INDEPENDENT_AMBULATORY_CARE_PROVIDER_SITE_OTHER): Payer: BLUE CROSS/BLUE SHIELD | Admitting: Endocrinology

## 2017-01-11 ENCOUNTER — Other Ambulatory Visit (HOSPITAL_COMMUNITY)
Admission: RE | Admit: 2017-01-11 | Discharge: 2017-01-11 | Disposition: A | Discharge status: Home / Self Care | Payer: BLUE CROSS/BLUE SHIELD | Source: Ambulatory Visit | Attending: Endocrinology | Admitting: Endocrinology

## 2017-01-11 DIAGNOSIS — E042 Nontoxic multinodular goiter: Secondary | ICD-10-CM | POA: Insufficient documentation

## 2017-01-11 NOTE — Progress Notes (Signed)
Subjective:    Patient ID: Isabel [name], female    DOB: [birth year], 61 y.o.   MRN: [id number]  HPI Pt is referred by Dr Maudie Mercury, for nodular thyroid.  Pt was noted to have a slight nodule at the thyroid in late 2017.  No assoc pain.  she is unaware of ever having had thyroid problems in the past.  she has no h/o XRT or surgery to the neck.   Past Medical History:  Diagnosis Date  . 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 Breast cancer ([place])    sees onc and gen surg   . Mitral regurgitation    mild on echo from 2009, no MVP per notes  . Osteoarthritis    w/ bilat hip resurfacing and possible metal reaction, sees ortho at [facility]  . Scoliosis   . Uterine fibroid   . Varicose veins    goes to [place] [facility], s/p laser and inj tx    Past Surgical History:  Procedure Laterality Date  . BREAST LUMPECTOMY  09/12   left breast  . GANGLION CYST EXCISION    . left hip resurfaced 2011    . right hip resurfaced 2008    . unterine fibroid  1998   removal    Social History   Social History  . Marital status: Married    Spouse name: N/A  . Number of children: N/A  . Years of education: N/A   Occupational History  . Art Director/Artist    Social History Main Topics  . Smoking status: Never Smoker  . Smokeless tobacco: Never Used  . Alcohol use 4.2 oz/week    7 Glasses of wine per week  . Drug use: No  . Sexual activity: Yes    Birth control/ protection: Post-menopausal   Other Topics Concern  . Not on file   Social History Narrative   Updated 08/2015:   Art Director/Artisit - currently unemployed   Married to [employer]   No children   Exercise 2 days per week/ diet healthy   Christian    Current Outpatient Prescriptions on File Prior to Visit  Medication Sig Dispense Refill  . BIOTIN 5000 PO Take by mouth.    . calcium carbonate 200 MG capsule Take 200 mg by mouth 2 (two) times daily with a meal.     . Cholecalciferol (VITAMIN D3) 5000 UNITS CAPS  Take 2,000 Int'l Units by mouth daily.     . Magnesium 500 MG CAPS Take 150 mg by mouth 2 (two) times daily.     . Omega-3 Fatty Acids (FISH OIL) 500 MG CAPS Take 690 mg by mouth 2 (two) times daily.     . vitamin E 400 UNIT capsule Take 200 Units by mouth daily.     . tamoxifen (NOLVADEX) 20 MG tablet Take 1 tablet (20 mg total) by mouth daily. (Patient not taking: Reported on 01/11/2017) 30 tablet 5   No current facility-administered medications on file prior to visit.     Allergies  Allergen Reactions  . Erythromycin Diarrhea and Nausea Only    Family History  Problem Relation Age of Onset  . Paget's disease of bone Mother   . Stroke Mother   . Hypertension Father   . Hyperlipidemia Father   . CAD Father 65  . Breast cancer Maternal Aunt   . Colon cancer Neg Hx   . Stomach cancer Neg Hx   . Thyroid disease Neg Hx  BP 126/72   Pulse 89   Ht 5' 6.5" (1.689 m)   Wt 143 lb (64.9 kg)   LMP 12/05/2012   SpO2 98%   BMI 22.74 kg/m     Review of Systems denies weight loss, blurry vision, headache, chest pain, sob, n/v, urinary frequency, muscle cramps, excessive diaphoresis, memory loss, depression, cold intolerance, rhinorrhea, and easy bruising     Objective:   Physical Exam VS: see vs page GEN: no distress HEAD: head: no deformity eyes: no periorbital swelling, no proptosis external nose and ears are normal mouth: no lesion seen NECK: left thyroid nodule is easily palpable. CHEST WALL: no deformity LUNGS: clear to auscultation CV: reg rate and rhythm, no murmur ABD: abdomen is soft, nontender.  no hepatosplenomegaly.  not distended.  no hernia MUSCULOSKELETAL: muscle bulk and strength are grossly normal.  no obvious joint swelling.  gait is normal and steady EXTEMITIES: no deformity.  no edema PULSES: no carotid bruit NEURO:  cn 2-12 grossly intact.   readily moves all 4's.  sensation is intact to touch on all 4's SKIN:  Normal texture and temperature.  No rash  or suspicious lesion is visible.   NODES:  None palpable at the neck PSYCH: alert, well-oriented.  Does not appear anxious nor depressed.  thyroid needle bx: consent obtained, signed form on chart The area is first sprayed with cooling agent local: xylocaine 2%, with epinephrine prep: alcohol pad 4 bxs are done with 25 and [id number] needles no complications      Assessment & Plan:  Multinodular goiter, new to me.  We have requested records from Dr in [place], including TSH, which pt believes was recently normal. Patient is advised the following: Patient Instructions  We'll let you know about the biopsy results. If as expected, no cancer is found, please come back for a follow-up appointment in 6-12 months.

## 2017-01-11 NOTE — Progress Notes (Signed)
   Subjective:    Patient ID: Isabel [name], female    DOB: 17-Sep-1956, 62 y.o.   MRN: [id number]  HPI Pt is referred by for nodular thyroid.  Pt was noted to have a nodule at the thyroid in early 2018.  she is unaware of ever having had thyroid problems in the past.  she has no h/o XRT or surgery to the neck.  She has slight dryness of the mouth, and assoc menopausal sxs.    Fax to Dr Conception Chancy at [phone].   Review of Systems Denies hoarseness, neck pain, visual loss, chest pain, sob, dysphagia, diarrhea, itching, depression, cold intolerance, headache, numbness, and rhinorrhea. She has gained weight.  She has easy bruising.    Objective:   Physical Exam VS: see vs page GEN: no distress HEAD: head: no deformity eyes: no periorbital swelling, no proptosis external nose and ears are normal mouth: no lesion seen NECK: 1-2 cm cm left thyroid nodule is noted. CHEST WALL: no deformity LUNGS: clear to auscultation CV: reg rate and rhythm, no murmur ABD: abdomen is soft, nontender.  no hepatosplenomegaly.  not distended.  no hernia MUSCULOSKELETAL: muscle bulk and strength are grossly normal.  no obvious joint swelling.  gait is normal and steady EXTEMITIES: no deformity.  no edema PULSES: no carotid bruit NEURO:  cn 2-12 grossly intact.   readily moves all 4's.  sensation is intact to touch on all 4's SKIN:  Normal texture and temperature.  No rash or suspicious lesion is visible.   NODES:  None palpable at the neck PSYCH: alert, well-oriented.  Does not appear anxious nor depressed.       Assessment & Plan:

## 2017-01-11 NOTE — Patient Instructions (Addendum)
We'll let you know about the biopsy results.  If as expected, no cancer is found, please come back for a follow-up appointment in 6-12 months.   

## 2017-03-07 DIAGNOSIS — Z01419 Encounter for gynecological examination (general) (routine) without abnormal findings: Secondary | ICD-10-CM | POA: Diagnosis not present

## 2017-03-07 DIAGNOSIS — Z6822 Body mass index (BMI) 22.0-22.9, adult: Secondary | ICD-10-CM | POA: Diagnosis not present

## 2017-03-08 ENCOUNTER — Telehealth: Payer: Self-pay | Admitting: Endocrinology

## 2017-03-08 NOTE — Telephone Encounter (Signed)
Pt is asking for further information regarding her biopsy results, she understands this as Dr. Loanne Drilling was unable to obtain enough fluid to diagnose her with cancer at this time. She states she is having to pay over $600 for this and feels it not worth repeating. Pt would like to speak with the MD.  Let the pt know the MD is out until next week.

## 2017-03-13 NOTE — Telephone Encounter (Signed)
I called pt today.  I left message on ans machine.  An alternative to repeat bx would be f/u in 6 months.  I'll see you then.

## 2017-04-04 DIAGNOSIS — N951 Menopausal and female climacteric states: Secondary | ICD-10-CM | POA: Diagnosis not present

## 2017-04-04 DIAGNOSIS — R5381 Other malaise: Secondary | ICD-10-CM | POA: Diagnosis not present

## 2017-04-04 DIAGNOSIS — E041 Nontoxic single thyroid nodule: Secondary | ICD-10-CM | POA: Diagnosis not present

## 2017-04-13 DIAGNOSIS — E042 Nontoxic multinodular goiter: Secondary | ICD-10-CM | POA: Diagnosis not present

## 2017-04-13 DIAGNOSIS — N951 Menopausal and female climacteric states: Secondary | ICD-10-CM | POA: Diagnosis not present

## 2017-04-13 DIAGNOSIS — R5383 Other fatigue: Secondary | ICD-10-CM | POA: Diagnosis not present

## 2017-04-13 DIAGNOSIS — J309 Allergic rhinitis, unspecified: Secondary | ICD-10-CM | POA: Diagnosis not present

## 2017-04-14 ENCOUNTER — Other Ambulatory Visit: Payer: Self-pay | Admitting: Family Medicine

## 2017-04-14 DIAGNOSIS — E041 Nontoxic single thyroid nodule: Secondary | ICD-10-CM

## 2017-04-24 ENCOUNTER — Ambulatory Visit (INDEPENDENT_AMBULATORY_CARE_PROVIDER_SITE_OTHER): Payer: BLUE CROSS/BLUE SHIELD | Admitting: Family Medicine

## 2017-04-24 ENCOUNTER — Encounter: Payer: Self-pay | Admitting: Family Medicine

## 2017-04-24 VITALS — BP 120/78 | HR 80 | Temp 99.0°F | Ht 66.5 in | Wt 140.5 lb

## 2017-04-24 DIAGNOSIS — H00012 Hordeolum externum right lower eyelid: Secondary | ICD-10-CM | POA: Diagnosis not present

## 2017-04-24 DIAGNOSIS — L03213 Periorbital cellulitis: Secondary | ICD-10-CM

## 2017-04-24 MED ORDER — CLINDAMYCIN HCL 300 MG PO CAPS: 300.0000 mg | ORAL_CAPSULE | Freq: Three times a day (TID) | ORAL | 0 refills | Status: DC

## 2017-04-24 MED FILL — CLINDAMYCIN HCL 300 MG CAP: 300 | 5 days supply | Qty: 15 | Fill #0

## 2017-04-24 NOTE — Patient Instructions (Signed)
BEFORE YOU LEAVE: -follow up: CPE in 3-4 months if you wish  Schedule appointment with opthomologist.  Stop all other treatments.  Compresses several times daily.  Take the antibiotic as instructed in the interim.  I hope you are feeling better soon! Seek opthomology care immediately if worsening, new concerns or you are not improving with treatment.

## 2017-04-24 NOTE — Progress Notes (Signed)
HPI:  Acute visit for R eye sty. -X 1 week -hx recurrent sty bilat -saw eye doc in the past and given tobra/dex - has been using this for 2 weeks (had another sty before this one) but this new one R lat lower lid has not healed and has some swelling of lid -she also has been using colloidal silver in the eye, stopped contacts and eye make up -no fevers, malaise, vision changes, globe pain, pus - but did express material from the stye with compresses   ROS: See pertinent positives and negatives per HPI.  Past Medical History:  Diagnosis Date  . 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 Breast cancer ([place])    sees onc and gen surg   . Mitral regurgitation    mild on echo from 2009, no MVP per notes  . Osteoarthritis    w/ bilat hip resurfacing and possible metal reaction, sees ortho at [facility]  . Scoliosis   . Uterine fibroid   . Varicose veins    goes to [place] [facility], s/p laser and inj tx    Past Surgical History:  Procedure Laterality Date  . BREAST LUMPECTOMY  09/12   left breast  . GANGLION CYST EXCISION    . left hip resurfaced 2011    . right hip resurfaced 2008    . unterine fibroid  1998   removal    Family History  Problem Relation Age of Onset  . Paget's disease of bone Mother   . Stroke Mother   . Hypertension Father   . Hyperlipidemia Father   . CAD Father 66  . Breast cancer Maternal Aunt   . Colon cancer Neg Hx   . Stomach cancer Neg Hx   . Thyroid disease Neg Hx     Social History   Social History  . Marital status: Married    Spouse name: N/A  . Number of children: N/A  . Years of education: N/A   Occupational History  . Art Director/Artist    Social History Main Topics  . Smoking status: Never Smoker  . Smokeless tobacco: Never Used  . Alcohol use 4.2 oz/week    7 Glasses of wine per week  . Drug use: No  . Sexual activity: Yes    Birth control/ protection: Post-menopausal   Other Topics Concern  . None    Social History Narrative   Updated 08/2015:   Art Director/Artisit - currently unemployed   Married to [employer]   No children   Exercise 2 days per week/ diet healthy   Christian     Current Outpatient Prescriptions:  .  Ascorbic Acid (VITA-C PO), Take by mouth., Disp: , Rfl:  .  BIOTIN 5000 PO, Take by mouth., Disp: , Rfl:  .  calcium carbonate 200 MG capsule, Take 200 mg by mouth 2 (two) times daily with a meal. , Disp: , Rfl:  .  Cholecalciferol (VITAMIN D3) 5000 UNITS CAPS, Take 2,000 Int'l Units by mouth daily. , Disp: , Rfl:  .  Multiple Vitamin (MULTI-VITAMIN DAILY PO), Take by mouth., Disp: , Rfl:  .  NON FORMULARY, Inflamed-herbal anti inflammatory, Disp: , Rfl:  .  NON FORMULARY, Herpa-trope-supplement for liver, Disp: , Rfl:  .  Omega-3 Fatty Acids (FISH OIL) 500 MG CAPS, Take 690 mg by mouth 2 (two) times daily. , Disp: , Rfl:  .  Probiotic Product (PROBIOTIC-10 PO), Take by mouth., Disp: , Rfl:  .  tamoxifen (NOLVADEX)  20 MG tablet, Take 1 tablet (20 mg total) by mouth daily., Disp: 30 tablet, Rfl: 5 .  vitamin E 400 UNIT capsule, Take 200 Units by mouth daily. , Disp: , Rfl:  .  clindamycin (CLEOCIN) 300 MG capsule, Take 1 capsule (300 mg total) by mouth 3 (three) times daily., Disp: 15 capsule, Rfl: 0  EXAM:  Vitals:   04/24/17 0935  BP: 120/78  Pulse: 80  Temp: 99 F (37.2 C)    Body mass index is 22.34 kg/m.  GENERAL: vitals reviewed and listed above, alert, oriented, appears well hydrated and in no acute distress  HEENT: atraumatic, conjunttiva clear, PERRLA, visual acuity grossly intact, mild-mod erythema and edema R lower lid with sty R lower lat lid, no obvious abnormalities on inspection of external nose and ears  NECK: no obvious masses on inspection  LUNGS: clear to auscultation bilaterally, no wheezes, rales or rhonchi, good air movement  CV: HRRR, no peripheral edema  MS: moves all extremities without noticeable abnormality  PSYCH: pleasant  and cooperative, no obvious depression or anxiety  ASSESSMENT AND PLAN:  Discussed the following assessment and plan:  Hordeolum externum of right lower eyelid  Preseptal cellulitis of right eye  -sty, not resolving on top abx/steroid -stop all top treatments except compresses, or clinda x 5 days, optho follow up  -Patient advised to notify optho immediately if symptoms worsen or persist or new concerns arise.  Note: she reports has labs and CPE with gyn and [facility], seeing endo for thyroid nodule.  Patient Instructions  BEFORE YOU LEAVE: -follow up: CPE in 3-4 months if you wish  Schedule appointment with opthomologist.  Stop all other treatments.  Compresses several times daily.  Take the antibiotic as instructed in the interim.  I hope you are feeling better soon! Seek opthomology care immediately if worsening, new concerns or you are not improving with treatment.      Colin Benton R., DO

## 2017-05-09 DIAGNOSIS — M542 Cervicalgia: Secondary | ICD-10-CM | POA: Diagnosis not present

## 2017-05-09 DIAGNOSIS — M25512 Pain in left shoulder: Secondary | ICD-10-CM | POA: Diagnosis not present

## 2017-05-12 DIAGNOSIS — M1811 Unilateral primary osteoarthritis of first carpometacarpal joint, right hand: Secondary | ICD-10-CM | POA: Diagnosis not present

## 2017-05-12 DIAGNOSIS — M67432 Ganglion, left wrist: Secondary | ICD-10-CM | POA: Diagnosis not present

## 2017-05-12 DIAGNOSIS — G5602 Carpal tunnel syndrome, left upper limb: Secondary | ICD-10-CM | POA: Diagnosis not present

## 2017-05-15 DIAGNOSIS — M542 Cervicalgia: Secondary | ICD-10-CM | POA: Diagnosis not present

## 2017-05-15 DIAGNOSIS — M25512 Pain in left shoulder: Secondary | ICD-10-CM | POA: Diagnosis not present

## 2017-05-18 DIAGNOSIS — M542 Cervicalgia: Secondary | ICD-10-CM | POA: Diagnosis not present

## 2017-05-18 DIAGNOSIS — M25512 Pain in left shoulder: Secondary | ICD-10-CM | POA: Diagnosis not present

## 2017-05-22 ENCOUNTER — Telehealth: Payer: Self-pay | Admitting: Endocrinology

## 2017-05-22 DIAGNOSIS — M67432 Ganglion, left wrist: Secondary | ICD-10-CM | POA: Diagnosis not present

## 2017-05-22 NOTE — Telephone Encounter (Signed)
Patient called to advise that she has her results from [facility] however, she has no way of contacting them to give Endocrinology's fax number. The patient wanted to get a staff members email so that she could email them the results and I suggested that the best way would probably be to print the results and bring them to the office.   Patient stated she would try and do that. Patient is requesting guidance on how to get the results to the office due to not being sure how the results will print (if they will at all).   Please call patient to assist.

## 2017-05-22 NOTE — Telephone Encounter (Signed)
I contacted the patient and discuss this message. Patient was advised she could have the labs faxed from the clinic the results were forwarded to originally or she could print and bring the results by the office. Patient voiced understanding and had no further questions at this time.

## 2017-05-24 ENCOUNTER — Ambulatory Visit
Admission: RE | Admit: 2017-05-24 | Discharge: 2017-05-24 | Disposition: A | Discharge status: Home / Self Care | Payer: BLUE CROSS/BLUE SHIELD | Source: Ambulatory Visit | Attending: Family Medicine | Admitting: Family Medicine

## 2017-05-24 DIAGNOSIS — E041 Nontoxic single thyroid nodule: Secondary | ICD-10-CM | POA: Diagnosis not present

## 2017-05-25 ENCOUNTER — Telehealth: Payer: Self-pay | Admitting: Endocrinology

## 2017-05-25 NOTE — Telephone Encounter (Signed)
Call the [facility] for the images from the ultrasound that was done 6/20.

## 2017-05-26 DIAGNOSIS — M67432 Ganglion, left wrist: Secondary | ICD-10-CM | POA: Diagnosis not present

## 2017-05-26 DIAGNOSIS — G5602 Carpal tunnel syndrome, left upper limb: Secondary | ICD-10-CM | POA: Diagnosis not present

## 2017-05-26 DIAGNOSIS — M1811 Unilateral primary osteoarthritis of first carpometacarpal joint, right hand: Secondary | ICD-10-CM | POA: Diagnosis not present

## 2017-05-26 NOTE — Telephone Encounter (Signed)
Patient notified we do have the [place] via voicemail and Dr. Loanne Drilling would review on Monday when he returns to the office.

## 2017-06-20 ENCOUNTER — Other Ambulatory Visit: Payer: BLUE CROSS/BLUE SHIELD

## 2017-08-24 ENCOUNTER — Encounter: Payer: Self-pay | Admitting: Family Medicine

## 2017-09-07 DIAGNOSIS — R922 Inconclusive mammogram: Secondary | ICD-10-CM | POA: Diagnosis not present

## 2017-09-07 DIAGNOSIS — Z853 Personal history of malignant neoplasm of breast: Secondary | ICD-10-CM | POA: Diagnosis not present

## 2017-09-07 LAB — HM MAMMOGRAPHY

## 2017-09-18 ENCOUNTER — Encounter: Payer: Self-pay | Admitting: Family Medicine

## 2017-11-16 ENCOUNTER — Telehealth: Payer: Self-pay | Admitting: Adult Health

## 2017-11-16 NOTE — Telephone Encounter (Signed)
Patient called in to reschedule  °

## 2017-11-21 ENCOUNTER — Encounter: Payer: Self-pay | Admitting: Adult Health

## 2017-11-23 ENCOUNTER — Encounter: Payer: Self-pay | Admitting: Adult Health

## 2017-12-12 ENCOUNTER — Encounter: Payer: Self-pay | Admitting: Adult Health

## 2017-12-12 NOTE — Progress Notes (Deleted)
CLINIC:  Survivorship   REASON FOR VISIT:  Routine follow-up for history of breast cancer.   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      08/25/2011 Surgery    Left breast lumpectomy:stage 0, pTis, PNX, for a high-grade DCIS with calcifications and comedo necrosis. No lymph nodes were examined, margins were not involved.      10/13/2011 - 11/30/2011 Radiation Therapy    Adjuvant radiation therapy      12/13/2011 - 11/23/2015 Anti-estrogen oral therapy    Tamoxifen 20 mg daily discontinued due to uterine bleeding 09/02/2013, later resumed it and took it intermittently        INTERVAL HISTORY:  Ms. Mahone presents to the [facility] today for routine follow-up for her history of breast cancer.  Overall, she reports feeling quite well. ***    REVIEW OF SYSTEMS:  Review of Systems - Oncology Breast: Denies any new nodularity, masses, tenderness, nipple changes, or nipple discharge.       PAST MEDICAL/SURGICAL HISTORY:  Past Medical History:  Diagnosis Date  . 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 Breast cancer ([place])    sees onc and gen surg   . Mitral regurgitation    mild on echo from 2009, no MVP per notes  . Osteoarthritis    w/ bilat hip resurfacing and possible metal reaction, sees ortho at [facility]  . Scoliosis   . Uterine fibroid   . Varicose veins    goes to [place] [facility], s/p laser and inj tx   Past Surgical History:  Procedure Laterality Date  . BREAST LUMPECTOMY  09/12   left breast  . GANGLION CYST EXCISION    . left hip resurfaced 2011    . right hip resurfaced 2008    . unterine fibroid  1998   removal     ALLERGIES:  Allergies  Allergen Reactions  . Erythromycin Diarrhea and Nausea Only     CURRENT  MEDICATIONS:  Outpatient Encounter Medications as of 12/12/2017  Medication Sig  . Ascorbic Acid (VITA-C PO) Take by mouth.  Marland Kitchen BIOTIN 5000 PO Take by mouth.  . calcium carbonate 200 MG capsule Take 200 mg by mouth 2 (two) times daily with a meal.   . Cholecalciferol (VITAMIN D3) 5000 UNITS CAPS Take 2,000 Int'l Units by mouth daily.   . clindamycin (CLEOCIN) 300 MG capsule Take 1 capsule (300 mg total) by mouth 3 (three) times daily.  . Multiple Vitamin (MULTI-VITAMIN DAILY PO) Take by mouth.  . NON FORMULARY Inflamed-herbal anti inflammatory  . NON FORMULARY Herpa-trope-supplement for liver  . Omega-3 Fatty Acids (FISH OIL) 500 MG CAPS Take 690 mg by mouth 2 (two) times daily.   . Probiotic Product (PROBIOTIC-10 PO) Take by mouth.  . tamoxifen (NOLVADEX) 20 MG tablet Take 1 tablet (20 mg total) by mouth daily.  . vitamin E 400 UNIT capsule Take 200 Units by mouth daily.    No facility-administered encounter medications on file as of 12/12/2017.      ONCOLOGIC FAMILY HISTORY:  Family History  Problem Relation Age of Onset  . Paget's disease of bone Mother   . Stroke Mother   . Hypertension Father   . Hyperlipidemia Father   . CAD Father 66  . Breast cancer Maternal  Aunt   . Colon cancer Neg Hx   . Stomach cancer Neg Hx   . Thyroid disease Neg Hx     GENETIC COUNSELING/TESTING: ***  SOCIAL HISTORY:  Syleena Mchan Vangieson is /single/married/divorced/widowed/separated and lives alone/with her spouse/family/friend in (city), [place].  She has (#) children and they live in (city).  Ms. Demos is currently retired/disabled/working part-time/full-time as ***.  She denies any current or history of tobacco, alcohol, or illicit drug use.     PHYSICAL EXAMINATION:  Vital Signs: There were no vitals filed for this visit. There were no vitals filed for this visit. General: Well-nourished, well-appearing female in no acute distress.  Unaccompanied/Accompanied by***** today.     HEENT: Head is normocephalic.  Pupils equal and reactive to light. Conjunctivae clear without exudate.  Sclerae anicteric. Oral mucosa is pink, moist.  Oropharynx is pink without lesions or erythema.  Lymph: No cervical, supraclavicular, or infraclavicular lymphadenopathy noted on palpation.  Cardiovascular: Regular rate and rhythm.Marland Kitchen Respiratory: Clear to auscultation bilaterally. Chest expansion symmetric; breathing non-labored.  Breast Exam:  -Left breast: No appreciable masses on palpation. No skin redness, thickening, or peau d'orange appearance; no nipple retraction or nipple discharge; mild distortion in symmetry at previous lumpectomy site***healed scar without erythema or nodularity.  -Right breast: No appreciable masses on palpation. No skin redness, thickening, or peau d'orange appearance; no nipple retraction or nipple discharge; mild distortion in symmetry at previous lumpectomy site***healed scar without erythema or nodularity. -Axilla: No axillary adenopathy bilaterally.  GI: Abdomen soft and round; non-tender, non-distended. Bowel sounds normoactive. No hepatosplenomegaly.   GU: Deferred.  Neuro: No focal deficits. Steady gait.  Psych: Mood and affect normal and appropriate for situation.  MSK: No focal spinal tenderness to palpation, full range of motion in bilateral upper extremities Extremities: No edema. Skin: Warm and dry.  LABORATORY DATA:  None for this visit   DIAGNOSTIC IMAGING:  Most recent mammogram:      ASSESSMENT AND PLAN:  Ms.. Loor is a pleasant 62 y.o. female with history of Stage 0 left breast DCIS, ER+/PR+, diagnosed in 08/2011, treated with lumpectomy, adjuvant radiation therapy, and anti-estrogen therapy with Tamoxifen for about 3 years stopping in 11/2015.  [facility] and routine follow-up.   1. History of breast cancer:  Ms. Harb is currently clinically and radiographically without evidence of  disease or recurrence of breast cancer. She will be due for mammogram in 09/2018; orders placed today.    I encouraged her to call me with any questions or concerns before her next visit at the cancer center, and I would be happy to see her sooner, if needed.    #. Problem(s) at Visit___________________.  #. Bone health:  Given Ms. Mikels's age, history of breast cancer, she is at risk for bone demineralization. I will defer to her PCP regarding bone density testing and management.  She was given education on specific food and activities to promote bone health.  #. Cancer screening:  Due to Ms. Heard's history and her age, she should receive screening for skin cancers, colon cancer, and ***gynecologic cancers. She was encouraged to follow-up with her PCP for appropriate cancer screenings.   #. Health maintenance and wellness promotion: Ms. Ernster was encouraged to consume 5-7 servings of fruits and vegetables per day. She was also encouraged to engage in moderate to vigorous exercise for 30 minutes per day most days of the week. She was instructed to limit her alcohol consumption and  continue to abstain from tobacco use/was encouraged stop smoking.  ***    Dispo:  -Return to cancer center in one year for LTS follow up -Mammogram due in 09/2018   A total of (30) minutes of face-to-face time was spent with this patient with greater than 50% of that time in counseling and care-coordination.   Gardenia Phlegm, [place] [phone]   Note: PRIMARY CARE PROVIDER Lucretia Kern, [place] [phone]

## 2017-12-14 ENCOUNTER — Encounter: Payer: Self-pay | Admitting: Family Medicine

## 2018-01-11 ENCOUNTER — Ambulatory Visit: Payer: BLUE CROSS/BLUE SHIELD | Admitting: Endocrinology

## 2018-02-05 NOTE — Progress Notes (Signed)
HPI:  Here for CPE: Last CPE in 2016 and only one acute visit since then.  Due for pap, flu vaccine, Hep C screen, labs. Due for f/u with onc and endo. She reports she will schedule her pelvic, breast exam and gynecology exam with her gynecologist, Dr. Ronita Hipps. Used to see a dermatologist for skin checks, declined skin check here today.  Denies any personal history or significant family history of melanoma skin cancer. She requested several labs today that are not part of a normal physical.  She understands that there may be an additional charge for these and that they may not be covered by her insurance. Mild depression on depression screening.  She does not feel that she needs treatment for this.  She thinks it is related to the bad winter this year and will improve with spring coming.  -Concerns and/or follow up today:  PMH breast Ca, hip OA, Vit D def, varicose veins. Sees oncology for surveillance and follow up for her breast cancer - looks like is due for visit as rescheduled in December. She sees Dr. Loanne Drilling for a thyroid goiter.  -Diet: variety of foods, balance and well rounded, larger portion sizes -Exercise: no regular exercise -Taking folic acid, vitamin D or calcium: no -Diabetes and Dyslipidemia Screening: Fasting for labs -Vaccines: see vaccine section EPIC -pap history: Appears to be due in epic, declines here, sees gynecologist -FDLMP: see nursing notes -sexual activity: yes, female partner, no new partners -wants STI testing (Hep C if born [birth year]): no -FH breast, colon or ovarian ca: see FH Last mammogram: 09/2017 birads 2, [facility] Last colon cancer screening: colonoscopy with Dr. Olevia Perches normal in 09/2012 Breast Ca Risk Assessment: see family history and pt history DEXA (>/= 66): Not applicable  -Alcohol, Tobacco, drug use: see social history  Review of Systems - no reported fevers, unintentional weight loss, vision loss, hearing loss, chest pain, sob, hemoptysis,  melena, hematochezia, hematuria, genital discharge, changing or concerning skin lesions, bleeding, bruising, loc, thoughts of self harm or SI.   Past Medical History:  Diagnosis Date  . 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 Breast cancer ([place])    sees onc and gen surg   . Mitral regurgitation    mild on echo from 2009, no MVP per notes  . Osteoarthritis    w/ bilat hip resurfacing and possible metal reaction, sees ortho at [facility]  . Scoliosis   . Uterine fibroid   . Varicose veins    goes to [place] [facility], s/p laser and inj tx    Past Surgical History:  Procedure Laterality Date  . BREAST LUMPECTOMY  09/12   left breast  . GANGLION CYST EXCISION    . left hip resurfaced 2011    . right hip resurfaced 2008    . unterine fibroid  1998   removal    Family History  Problem Relation Age of Onset  . Paget's disease of bone Mother   . Stroke Mother   . Hypertension Father   . Hyperlipidemia Father   . CAD Father 94  . Breast cancer Maternal Aunt   . Colon cancer Neg Hx   . Stomach cancer Neg Hx   . Thyroid disease Neg Hx     Social History   Socioeconomic History  . Marital status: Married    Spouse name: None  . Number of children: None  . Years of education: None  . Highest education level: None  Social Needs  .  Financial resource strain: None  . Food insecurity - worry: None  . Food insecurity - inability: None  . Transportation needs - medical: None  . Transportation needs - non-medical: None  Occupational History  . Occupation: Art Director/Artist  Tobacco Use  . Smoking status: Never Smoker  . Smokeless tobacco: Never Used  Substance and Sexual Activity  . Alcohol use: Yes    Alcohol/week: 4.2 oz    Types: 7 Glasses of wine per week  . Drug use: No  . Sexual activity: Yes    Birth control/protection: Post-menopausal  Other Topics Concern  . None  Social History Narrative   Updated 08/2015:   Art Director/Artisit - currently  unemployed   Married to [employer]   No children   Exercise 2 days per week/ diet healthy   Christian     Current Outpatient Medications:  .  Ascorbic Acid (VITA-C PO), Take by mouth., Disp: , Rfl:  .  BIOTIN 5000 PO, Take by mouth., Disp: , Rfl:  .  CALCIUM CITRATE PO, Take by mouth daily., Disp: , Rfl:  .  Cholecalciferol (VITAMIN D3 PO), Take 2,000 Units by mouth 2 (two) times daily., Disp: , Rfl:  .  Multiple Vitamin (MULTI-VITAMIN DAILY PO), Take by mouth., Disp: , Rfl:  .  NON FORMULARY, Inflamed-herbal anti inflammatory, Disp: , Rfl:  .  NON FORMULARY, Hepa-trope-supplement for liver, Disp: , Rfl:  .  Omega-3 Fatty Acids (FISH OIL) 500 MG CAPS, Take 690 mg by mouth 2 (two) times daily. , Disp: , Rfl:  .  Probiotic Product (PROBIOTIC-10 PO), Take by mouth., Disp: , Rfl:   EXAM:  Vitals:   02/06/18 0947  BP: 100/62  Pulse: 76  Temp: 98.2 F (36.8 C)    GENERAL: vitals reviewed and listed below, alert, oriented, appears well hydrated and in no acute distress  HEENT: head atraumatic, PERRLA, normal appearance of eyes, ears, nose and mouth. moist mucus membranes.  NECK: supple  LUNGS: clear to auscultation bilaterally, no rales, rhonchi or wheeze  CV: HRRR, no peripheral edema or cyanosis, normal pedal pulses  ABDOMEN: bowel sounds normal, soft, non tender to palpation, no masses, no rebound or guarding  GU/BREAST: Declined, sees GYN  SKIN: no rash or abnormal lesions, declined full skin exam  MS: normal gait, moves all extremities normally  NEURO: normal gait, speech and thought processing grossly intact, muscle tone grossly intact throughout  PSYCH: normal affect, pleasant and cooperative  ASSESSMENT AND PLAN:  Discussed the following assessment and plan:  PREVENTIVE EXAM: -Discussed and advised all [place] preventive services health task force level A and B recommendations for age, sex and risks. -Advised at least 150 minutes of exercise per week and a healthy  diet with avoidance of (less then 1 serving per week) processed foods, white starches, red meat, fast foods and sweets and consisting of: * 5-9 servings of fresh fruits and vegetables (not corn or potatoes) *nuts and seeds, beans *olives and olive oil *lean meats such as fish and white chicken  *whole grains -fasting labs, studies and vaccines per orders this encounter - Hemoglobin A1c - Lipid panel - Basic metabolic panel  2. Screening for depression -See screening PHQ 9 -Discussed results, she has mild depression -Declines treatment at this time, feels is related to the weather -Did provide her with a brochure for cognitive behavioral therapy and advised follow-up if any worsening or concerns  3. History of breast cancer - CBC -Advised to schedule her follow-up with-oncology and  annual gyn exam  4. Multinodular goiter -advised to schedule her endocrine follow up - TSH  5. Vitamin D deficiency - VITAMIN D 25 Hydroxy (Vit-D Deficiency, Fractures)  6. Need for hepatitis C screening test - Hepatitis C antibody    Patient Instructions  BEFORE YOU LEAVE: -Labs -follow up: Yearly for physical and as needed  Please call your gynecologist to set up your annual exam.    Please call your oncologist and your endocrinologist to set up your follow-up visits.  We have ordered labs or studies at this visit. It can take up to 1-2 weeks for results and processing. IF results require follow up or explanation, we will call you with instructions. Clinically stable results will be released to your [facility]. If you have not heard from [place] or cannot find your results in [facility] in 2 weeks please contact our office at [phone].  If you are not yet signed up for [facility], please consider signing up.   Preventive Care 40-64 Years, Female Preventive care refers to lifestyle choices and visits with your health care provider that can promote health and wellness. What does preventive care  include?  A yearly physical exam. This is also called an annual well check.  Dental exams once or twice a year.  Routine eye exams. Ask your health care provider how often you should have your eyes checked.  Personal lifestyle choices, including: ? Daily care of your teeth and gums. ? Regular physical activity. ? Eating a healthy diet. ? Avoiding tobacco and drug use. ? Limiting alcohol use. ? Practicing safe sex. ? Taking low-dose aspirin daily starting at age 30. ? Taking vitamin and mineral supplements as recommended by your health care provider. What happens during an annual well check? The services and screenings done by your health care provider during your annual well check will depend on your age, overall health, lifestyle risk factors, and family history of disease. Counseling Your health care provider may ask you questions about your:  Alcohol use.  Tobacco use.  Drug use.  Emotional well-being.  Home and relationship well-being.  Sexual activity.  Eating habits.  Work and work Statistician.  Method of birth control.  Menstrual cycle.  Pregnancy history.  Screening You may have the following tests or measurements:  Height, weight, and BMI.  Blood pressure.  Lipid and cholesterol levels. These may be checked every 5 years, or more frequently if you are over 67 years old.  Skin check.  Lung cancer screening. You may have this screening every year starting at age 3 if you have a 30-pack-year history of smoking and currently smoke or have quit within the past 15 years.  Fecal occult blood test (FOBT) of the stool. You may have this test every year starting at age 33.  Flexible sigmoidoscopy or colonoscopy. You may have a sigmoidoscopy every 5 years or a colonoscopy every 10 years starting at age 42.  Hepatitis C blood test.  Hepatitis B blood test.  Sexually transmitted disease (STD) testing.  Diabetes screening. This is done by checking your  blood sugar (glucose) after you have not eaten for a while (fasting). You may have this done every 1-3 years.  Mammogram. This may be done every 1-2 years. Talk to your health care provider about when you should start having regular mammograms. This may depend on whether you have a family history of breast cancer.  BRCA-related cancer screening. This may be done if you have a family history of breast, ovarian,  tubal, or peritoneal cancers.  Pelvic exam and Pap test. This may be done every 3 years starting at age 12. Starting at age 87, this may be done every 5 years if you have a Pap test in combination with an HPV test.  Bone density scan. This is done to screen for osteoporosis. You may have this scan if you are at high risk for osteoporosis.  Discuss your test results, treatment options, and if necessary, the need for more tests with your health care provider. Vaccines Your health care provider may recommend certain vaccines, such as:  Influenza vaccine. This is recommended every year.  Tetanus, diphtheria, and acellular pertussis (Tdap, Td) vaccine. You may need a Td booster every 10 years.  Varicella vaccine. You may need this if you have not been vaccinated.  Zoster vaccine. You may need this after age 67.  Measles, mumps, and rubella (MMR) vaccine. You may need at least one dose of MMR if you were born in 1957 or later. You may also need a second dose.  Pneumococcal 13-valent conjugate (PCV13) vaccine. You may need this if you have certain conditions and were not previously vaccinated.  Pneumococcal polysaccharide (PPSV23) vaccine. You may need one or two doses if you smoke cigarettes or if you have certain conditions.  Meningococcal vaccine. You may need this if you have certain conditions.  Hepatitis A vaccine. You may need this if you have certain conditions or if you travel or work in places where you may be exposed to hepatitis A.  Hepatitis B vaccine. You may need this if  you have certain conditions or if you travel or work in places where you may be exposed to hepatitis B.  Haemophilus influenzae type b (Hib) vaccine. You may need this if you have certain conditions.  Talk to your health care provider about which screenings and vaccines you need and how often you need them. This information is not intended to replace advice given to you by your health care provider. Make sure you discuss any questions you have with your health care provider. Document Released: 12/18/2015 Document Revised: 08/10/2016 Document Reviewed: 09/22/2015 Elsevier Interactive Patient Education  2018 [employer].          No Follow-up on file.  Lucretia Kern, DO

## 2018-02-06 ENCOUNTER — Ambulatory Visit (INDEPENDENT_AMBULATORY_CARE_PROVIDER_SITE_OTHER): Payer: BLUE CROSS/BLUE SHIELD | Admitting: Family Medicine

## 2018-02-06 ENCOUNTER — Encounter: Payer: Self-pay | Admitting: Family Medicine

## 2018-02-06 VITALS — BP 100/62 | HR 76 | Temp 98.2°F | Ht 67.25 in | Wt 144.5 lb

## 2018-02-06 DIAGNOSIS — Z1331 Encounter for screening for depression: Secondary | ICD-10-CM | POA: Diagnosis not present

## 2018-02-06 DIAGNOSIS — Z853 Personal history of malignant neoplasm of breast: Secondary | ICD-10-CM

## 2018-02-06 DIAGNOSIS — Z1159 Encounter for screening for other viral diseases: Secondary | ICD-10-CM

## 2018-02-06 DIAGNOSIS — E042 Nontoxic multinodular goiter: Secondary | ICD-10-CM

## 2018-02-06 DIAGNOSIS — E559 Vitamin D deficiency, unspecified: Secondary | ICD-10-CM

## 2018-02-06 DIAGNOSIS — Z0000 Encounter for general adult medical examination without abnormal findings: Secondary | ICD-10-CM | POA: Diagnosis not present

## 2018-02-06 LAB — CBC
HCT: 42 % (ref 36.0–46.0)
Hemoglobin: 14.4 g/dL (ref 12.0–15.0)
MCHC: 34.3 g/dL (ref 30.0–36.0)
MCV: 88.5 fl (ref 78.0–100.0)
Platelets: 226 10*3/uL (ref 150.0–400.0)
RBC: 4.75 Mil/uL (ref 3.87–5.11)
RDW: 12.3 % (ref 11.5–15.5)
WBC: 4.1 10*3/uL (ref 4.0–10.5)

## 2018-02-06 LAB — LIPID PANEL
CHOL/HDL RATIO: 2
Cholesterol: 176 mg/dL (ref 0–200)
HDL: 88.2 mg/dL (ref >39.00)
LDL Cholesterol: 75 mg/dL (ref 0–99)
NONHDL: 88.15
Triglycerides: 65 mg/dL (ref 0.0–149.0)
VLDL: 13 mg/dL (ref 0.0–40.0)

## 2018-02-06 LAB — BASIC METABOLIC PANEL
BUN: 16 mg/dL (ref 6–23)
CALCIUM: 10 mg/dL (ref 8.4–10.5)
CO2: 31 meq/L (ref 19–32)
CREATININE: 0.58 mg/dL (ref 0.40–1.20)
Chloride: 103 mEq/L (ref 96–112)
GFR: 112.64 mL/min (ref >60.00)
GLUCOSE: 89 mg/dL (ref 70–99)
Potassium: 4.3 mEq/L (ref 3.5–5.1)
SODIUM: 140 meq/L (ref 135–145)

## 2018-02-06 LAB — HEMOGLOBIN A1C: HEMOGLOBIN A1C: 5.7 % (ref 4.6–6.5)

## 2018-02-06 LAB — VITAMIN D 25 HYDROXY (VIT D DEFICIENCY, FRACTURES): VITD: 55.63 ng/mL (ref 30.00–100.00)

## 2018-02-06 LAB — TSH: TSH: 1.03 u[IU]/mL (ref 0.35–4.50)

## 2018-02-06 NOTE — Patient Instructions (Signed)
BEFORE YOU LEAVE: -Labs -follow up: Yearly for physical and as needed  Please call your gynecologist to set up your annual exam.    Please call your oncologist and your endocrinologist to set up your follow-up visits.  We have ordered labs or studies at this visit. It can take up to 1-2 weeks for results and processing. IF results require follow up or explanation, we will call you with instructions. Clinically stable results will be released to your [facility]. If you have not heard from [place] or cannot find your results in [facility] in 2 weeks please contact our office at [phone].  If you are not yet signed up for [facility], please consider signing up.   Preventive Care 40-64 Years, Female Preventive care refers to lifestyle choices and visits with your health care provider that can promote health and wellness. What does preventive care include?  A yearly physical exam. This is also called an annual well check.  Dental exams once or twice a year.  Routine eye exams. Ask your health care provider how often you should have your eyes checked.  Personal lifestyle choices, including: ? Daily care of your teeth and gums. ? Regular physical activity. ? Eating a healthy diet. ? Avoiding tobacco and drug use. ? Limiting alcohol use. ? Practicing safe sex. ? Taking low-dose aspirin daily starting at age 103. ? Taking vitamin and mineral supplements as recommended by your health care provider. What happens during an annual well check? The services and screenings done by your health care provider during your annual well check will depend on your age, overall health, lifestyle risk factors, and family history of disease. Counseling Your health care provider may ask you questions about your:  Alcohol use.  Tobacco use.  Drug use.  Emotional well-being.  Home and relationship well-being.  Sexual activity.  Eating habits.  Work and work Statistician.  Method of birth  control.  Menstrual cycle.  Pregnancy history.  Screening You may have the following tests or measurements:  Height, weight, and BMI.  Blood pressure.  Lipid and cholesterol levels. These may be checked every 5 years, or more frequently if you are over 44 years old.  Skin check.  Lung cancer screening. You may have this screening every year starting at age 11 if you have a 30-pack-year history of smoking and currently smoke or have quit within the past 15 years.  Fecal occult blood test (FOBT) of the stool. You may have this test every year starting at age 19.  Flexible sigmoidoscopy or colonoscopy. You may have a sigmoidoscopy every 5 years or a colonoscopy every 10 years starting at age 50.  Hepatitis C blood test.  Hepatitis B blood test.  Sexually transmitted disease (STD) testing.  Diabetes screening. This is done by checking your blood sugar (glucose) after you have not eaten for a while (fasting). You may have this done every 1-3 years.  Mammogram. This may be done every 1-2 years. Talk to your health care provider about when you should start having regular mammograms. This may depend on whether you have a family history of breast cancer.  BRCA-related cancer screening. This may be done if you have a family history of breast, ovarian, tubal, or peritoneal cancers.  Pelvic exam and Pap test. This may be done every 3 years starting at age 36. Starting at age 70, this may be done every 5 years if you have a Pap test in combination with an HPV test.  Bone density scan.  This is done to screen for osteoporosis. You may have this scan if you are at high risk for osteoporosis.  Discuss your test results, treatment options, and if necessary, the need for more tests with your health care provider. Vaccines Your health care provider may recommend certain vaccines, such as:  Influenza vaccine. This is recommended every year.  Tetanus, diphtheria, and acellular pertussis (Tdap,  Td) vaccine. You may need a Td booster every 10 years.  Varicella vaccine. You may need this if you have not been vaccinated.  Zoster vaccine. You may need this after age 21.  Measles, mumps, and rubella (MMR) vaccine. You may need at least one dose of MMR if you were born in 1957 or later. You may also need a second dose.  Pneumococcal 13-valent conjugate (PCV13) vaccine. You may need this if you have certain conditions and were not previously vaccinated.  Pneumococcal polysaccharide (PPSV23) vaccine. You may need one or two doses if you smoke cigarettes or if you have certain conditions.  Meningococcal vaccine. You may need this if you have certain conditions.  Hepatitis A vaccine. You may need this if you have certain conditions or if you travel or work in places where you may be exposed to hepatitis A.  Hepatitis B vaccine. You may need this if you have certain conditions or if you travel or work in places where you may be exposed to hepatitis B.  Haemophilus influenzae type b (Hib) vaccine. You may need this if you have certain conditions.  Talk to your health care provider about which screenings and vaccines you need and how often you need them. This information is not intended to replace advice given to you by your health care provider. Make sure you discuss any questions you have with your health care provider. Document Released: 12/18/2015 Document Revised: 08/10/2016 Document Reviewed: 09/22/2015 Elsevier Interactive Patient Education  [employer].

## 2018-02-07 LAB — HEPATITIS C ANTIBODY
Hepatitis C Ab: NONREACTIVE
SIGNAL TO CUT-OFF: 0.01 (ref <1.00)

## 2018-03-20 ENCOUNTER — Telehealth: Payer: Self-pay | Admitting: Adult Health

## 2018-03-20 ENCOUNTER — Inpatient Hospital Stay: Payer: BLUE CROSS/BLUE SHIELD | Attending: Adult Health | Admitting: Adult Health

## 2018-03-20 VITALS — BP 133/74 | HR 77 | Temp 98.5°F | Resp 18 | Ht 67.25 in

## 2018-03-20 DIAGNOSIS — M1990 Unspecified osteoarthritis, unspecified site: Secondary | ICD-10-CM | POA: Insufficient documentation

## 2018-03-20 DIAGNOSIS — I340 Nonrheumatic mitral (valve) insufficiency: Secondary | ICD-10-CM

## 2018-03-20 DIAGNOSIS — Z853 Personal history of malignant neoplasm of breast: Secondary | ICD-10-CM

## 2018-03-20 DIAGNOSIS — I10 Essential (primary) hypertension: Secondary | ICD-10-CM

## 2018-03-20 DIAGNOSIS — Z923 Personal history of irradiation: Secondary | ICD-10-CM | POA: Insufficient documentation

## 2018-03-20 DIAGNOSIS — Z800 Family history of malignant neoplasm of digestive organs: Secondary | ICD-10-CM | POA: Diagnosis not present

## 2018-03-20 DIAGNOSIS — M419 Scoliosis, unspecified: Secondary | ICD-10-CM

## 2018-03-20 DIAGNOSIS — Z170 Estrogen receptor positive status [ER+]: Secondary | ICD-10-CM

## 2018-03-20 DIAGNOSIS — E041 Nontoxic single thyroid nodule: Secondary | ICD-10-CM | POA: Diagnosis not present

## 2018-03-20 DIAGNOSIS — C50412 Malignant neoplasm of upper-outer quadrant of left female breast: Secondary | ICD-10-CM

## 2018-03-20 NOTE — Telephone Encounter (Signed)
Patient called to verify appointment

## 2018-03-20 NOTE — Patient Instructions (Signed)
Replens, lubricants, Josph Macho Touch  Prasterone vaginal insert What is this medicine? PRASTERONE (PRAS ter one), also known as DEHYDROEPIANDROSTERONE (DHEA) is used to treat females who experience painful sexual intercourse, a symptom of menopause that occurs due to changes in and around the vagina. This medicine may be used for other purposes; ask your health care provider or pharmacist if you have questions. COMMON BRAND NAME(S): INTRAROSA What should I tell my health care provider before I take this medicine? They need to know if you have any of these conditions: -cancer, such as breast, uterine, or other cancer -history of vaginal bleeding -an unusual or allergic reaction to prasterone, DHEA, other hormones, medicines, foods, dyes, or preservatives -pregnant or trying to get pregnant -breast-feeding How should I use this medicine? This medicine is for vaginal use only. Do not take by mouth. Follow the directions on the prescription label. Read package directions carefully before using. Wash hands before and after use. Use this medicine at bedtime. Do not use it more often than directed. Do not stop using except on your doctor's advice. Talk to your pediatrician regarding the use of this medicine in children. This medicine is not approved for use in children. Overdosage: If you think you have taken too much of this medicine contact a poison control center or emergency room at once. NOTE: This medicine is only for you. Do not share this medicine with others. What if I miss a dose? If you miss a dose, use it as soon as you can. If it is almost time for your next dose, use only that dose. Do not use double or extra doses. What may interact with this medicine? Interactions are not expected. Do not use any other vaginal products without telling your doctor or health care professional. This list may not describe all possible interactions. Give your health care provider a list of all the  medicines, herbs, non-prescription drugs, or dietary supplements you use. Also tell them if you smoke, drink alcohol, or use illegal drugs. Some items may interact with your medicine. What should I watch for while using this medicine? Visit your doctor or health care professional for a regular check on your progress. This medicine may cause changes on a cervical Pap smear. You will receive regular pelvic exams. What side effects may I notice from receiving this medicine? Side effects that you should report to your doctor or health care professional as soon as possible: -allergic reactions like skin rash, itching or hives Side effects that usually do not require medical attention (report to your doctor or health care professional if they continue or are bothersome): -vaginal discharge This list may not describe all possible side effects. Call your doctor for medical advice about side effects. You may report side effects to FDA at 1-800-FDA-1088. Where should I keep my medicine? Keep out of the reach of children. Store at room temperature or in a refrigerator between 5 and 30 degrees C (41 and 86 degrees F). Throw away any unused medicine after the expiration date. NOTE: This sheet is a summary. It may not cover all possible information. If you have questions about this medicine, talk to your doctor, pharmacist, or health care provider.  2018 Elsevier/Gold Standard (2016-05-17 12:30:18)

## 2018-03-20 NOTE — Telephone Encounter (Signed)
Gave avs and calendar ° °

## 2018-03-20 NOTE — Progress Notes (Signed)
CLINIC:  Survivorship   REASON FOR VISIT:  Routine follow-up for history of breast cancer.   BRIEF ONCOLOGIC HISTORY:    Malignant neoplasm of upper-outer quadrant of left female breast ([place])   08/09/2011 Initial Diagnosis    Left breast biopsy: high-grade ductal carcinoma in situ, with comedonecrosis and calcification, ER +97%, PR +100%.      08/25/2011 Surgery    Left breast lumpectomy:stage 0, pTis, PNX, for a high-grade DCIS with calcifications and comedo necrosis. No lymph nodes were examined, margins were not involved.      10/13/2011 - 11/30/2011 Radiation Therapy    Adjuvant radiation therapy      12/13/2011 - 11/23/2015 Anti-estrogen oral therapy    Tamoxifen 20 mg daily discontinued due to uterine bleeding 09/02/2013, later resumed it and took it intermittently        INTERVAL HISTORY:  Isabel [name] presents to the [facility] today for routine follow-up for her history of breast cancer.  Overall, she reports feeling quite well. Breslyn is doing well.  She notes that she has had pain near her surgical site.  She notes she also fell and jarred her shoulder and it radiates in that area.  She says she has a lump in this area that has been present since surgery and radiation.    She sees her PCP regularly.  She exercises by taking dance aerobics twice per week.  She reports some vaginal dryness.  She did undergo thyroid biopsy in 01/2017 that was non diagnostic.  She had repeat ultrasound in 05/2017.  There is continued concern in the ultrasound report that the nodule is suspicious.  She follows with Dr. Loanne Drilling in endocrinology for this.      REVIEW OF SYSTEMS:  Review of Systems - Oncology Breast: Denies any new nodularity, masses, tenderness, nipple changes, or nipple discharge.       PAST MEDICAL/SURGICAL HISTORY:  Past Medical History:  Diagnosis Date  . 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 Breast cancer ([place])    sees onc and gen surg   .  Mitral regurgitation    mild on echo from 2009, no MVP per notes  . Osteoarthritis    w/ bilat hip resurfacing and possible metal reaction, sees ortho at [facility]  . Scoliosis   . Uterine fibroid   . Varicose veins    goes to [place] [facility], s/p laser and inj tx   Past Surgical History:  Procedure Laterality Date  . BREAST LUMPECTOMY  09/12   left breast  . GANGLION CYST EXCISION    . left hip resurfaced 2011    . right hip resurfaced 2008    . unterine fibroid  1998   removal     ALLERGIES:  Allergies  Allergen Reactions  . Erythromycin Diarrhea and Nausea Only  . Latex     Sores in mouth     CURRENT MEDICATIONS:  Outpatient Encounter Medications as of 03/20/2018  Medication Sig  . Ascorbic Acid (VITA-C PO) Take by mouth.  Marland Kitchen BIOTIN 5000 PO Take by mouth.  Marland Kitchen CALCIUM CITRATE PO Take by mouth daily.  . Cholecalciferol (VITAMIN D3 PO) Take 2,000 Units by mouth 2 (two) times daily.  . Multiple Vitamin (MULTI-VITAMIN DAILY PO) Take by mouth.  . NON FORMULARY Inflamed-herbal anti inflammatory  . NON FORMULARY Hepa-trope-supplement for liver  . Omega-3 Fatty Acids (FISH OIL) 500 MG CAPS Take 690 mg by mouth 2 (two) times daily.   . Probiotic  Product (PROBIOTIC-10 PO) Take by mouth.   No facility-administered encounter medications on file as of 03/20/2018.      ONCOLOGIC FAMILY HISTORY:  Family History  Problem Relation Age of Onset  . Paget's disease of bone Mother   . Stroke Mother   . Hypertension Father   . Hyperlipidemia Father   . CAD Father 60  . Breast cancer Maternal Aunt   . Colon cancer Neg Hx   . Stomach cancer Neg Hx   . Thyroid disease Neg Hx       SOCIAL HISTORY:  Social History   Socioeconomic History  . Marital status: Married    Spouse name: Not on file  . Number of children: Not on file  . Years of education: Not on file  . Highest education level: Not on file  Occupational History  . Occupation: Counselling psychologist  Social Needs   . Financial resource strain: Not on file  . Food insecurity:    Worry: Not on file    Inability: Not on file  . Transportation needs:    Medical: Not on file    Non-medical: Not on file  Tobacco Use  . Smoking status: Never Smoker  . Smokeless tobacco: Never Used  Substance and Sexual Activity  . Alcohol use: Yes    Alcohol/week: 4.2 oz    Types: 7 Glasses of wine per week  . Drug use: No  . Sexual activity: Yes    Birth control/protection: Post-menopausal  Lifestyle  . Physical activity:    Days per week: Not on file    Minutes per session: Not on file  . Stress: Not on file  Relationships  . Social connections:    Talks on phone: Not on file    Gets together: Not on file    Attends religious service: Not on file    Active member of club or organization: Not on file    Attends meetings of clubs or organizations: Not on file    Relationship status: Not on file  . Intimate partner violence:    Fear of current or ex partner: Not on file    Emotionally abused: Not on file    Physically abused: Not on file    Forced sexual activity: Not on file  Other Topics Concern  . 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  Unaccompanied today.   HEENT: Head is normocephalic.  Pupils equal and reactive to light. Conjunctivae clear without exudate.  Sclerae anicteric. Oral mucosa is pink, moist.  Oropharynx is pink without lesions or erythema.  Lymph: No cervical, supraclavicular, or infraclavicular lymphadenopathy noted on palpation.  Cardiovascular: Regular rate and rhythm.Marland Kitchen Respiratory: Clear to auscultation  bilaterally. Chest expansion symmetric; breathing non-labored.  Breast Exam:  -Left breast: No appreciable masses on palpation. No skin redness, thickening, or peau d'orange appearance; no nipple retraction or nipple discharge; mild distortion in symmetry at previous lumpectomy site well healed scar without erythema or nodularity.  -Right breast: No appreciable masses on palpation. No skin redness, thickening, or peau d'orange appearance; no  nipple retraction or nipple discharge; -Axilla: No axillary adenopathy bilaterally.  GI: Abdomen soft and round; non-tender, non-distended. Bowel sounds normoactive. No hepatosplenomegaly.   GU: Deferred.  Neuro: No focal deficits. Steady gait.  Psych: Mood and affect normal and appropriate for situation.  MSK: No focal spinal tenderness to palpation, full range of motion in bilateral upper extremities Extremities: No edema. Skin: Warm and dry.  LABORATORY DATA:  None for this visit   DIAGNOSTIC IMAGING:  Most recent mammogram:     ASSESSMENT AND PLAN:  Ms.. [name] is a pleasant 62 y.o. female with history of Stage 0 left breast DCIS, ER+/PR+, diagnosed in 08/2011, treated with lumpectomy, adjuvant radiation therapy, and anti-estrogen therapy with Tamoxifen x 3 years (stopped early due to uterine bleeding).  [facility] and routine follow-up.   1. History of breast cancer:  Ms. Sorto is currently clinically and radiographically without evidence of disease or recurrence of breast cancer. She will be due for mammogram in 09/07/2018.  She will return in one year for LTS follow up.  I encouraged her to call me with any questions or concerns before her next visit at the cancer center, and I would be happy to see her sooner, if needed.    2. Thyroid nodule: I recommended that she continue to have this followed with Dr. Loanne Drilling in endocrinology.  3. Vaginal Dryness:  We reviewed options for vaginal dryness  including Replens, [facility], [employer] [facility], and pelvic rehab.  I gave her information in her AVS about this.  She will start with Replens.  I cautioned her about vaginal atrophy and the potential for it, and the potential need for dilation.    4. Cancer screening:  Due to Ms. Kinsel's history and her age, she should receive screening for skin cancers, colon cancer, and gynecologic cancers. She was encouraged to follow-up with her PCP for appropriate cancer screenings.   5. Health maintenance and wellness promotion: Ms. Heacock was encouraged to consume 5-7 servings of fruits and vegetables per day. She was also encouraged to engage in moderate to vigorous exercise for 30 minutes per day most days of the week. She was instructed to limit her alcohol consumption and continue to abstain from tobacco use.    Dispo:  -Return to cancer center in one year for LTS follow up -Mammogram in 09/2018 when due   A total of (30) minutes of face-to-face time was spent with this patient with greater than 50% of that time in counseling and care-coordination.   Gardenia Phlegm, [place] [phone]   Note: PRIMARY CARE PROVIDER Lucretia Kern, [place] [phone]

## 2018-03-21 ENCOUNTER — Encounter: Payer: Self-pay | Admitting: Adult Health

## 2018-03-21 DIAGNOSIS — Z01419 Encounter for gynecological examination (general) (routine) without abnormal findings: Secondary | ICD-10-CM | POA: Diagnosis not present

## 2018-03-21 DIAGNOSIS — Z1151 Encounter for screening for human papillomavirus (HPV): Secondary | ICD-10-CM | POA: Diagnosis not present

## 2018-03-21 DIAGNOSIS — Z6822 Body mass index (BMI) 22.0-22.9, adult: Secondary | ICD-10-CM | POA: Diagnosis not present

## 2018-04-03 ENCOUNTER — Encounter: Payer: Self-pay | Admitting: Family Medicine

## 2018-04-03 ENCOUNTER — Ambulatory Visit: Payer: BLUE CROSS/BLUE SHIELD | Admitting: Family Medicine

## 2018-04-03 VITALS — BP 90/60 | HR 68 | Temp 98.2°F | Ht 67.25 in | Wt 148.2 lb

## 2018-04-03 DIAGNOSIS — M2170 Unequal limb length (acquired), unspecified site: Secondary | ICD-10-CM

## 2018-04-03 DIAGNOSIS — M79671 Pain in right foot: Secondary | ICD-10-CM | POA: Diagnosis not present

## 2018-04-03 DIAGNOSIS — M9905 Segmental and somatic dysfunction of pelvic region: Secondary | ICD-10-CM

## 2018-04-03 DIAGNOSIS — M9903 Segmental and somatic dysfunction of lumbar region: Secondary | ICD-10-CM

## 2018-04-03 DIAGNOSIS — M25519 Pain in unspecified shoulder: Secondary | ICD-10-CM

## 2018-04-03 DIAGNOSIS — M9902 Segmental and somatic dysfunction of thoracic region: Secondary | ICD-10-CM | POA: Diagnosis not present

## 2018-04-03 DIAGNOSIS — M419 Scoliosis, unspecified: Secondary | ICD-10-CM | POA: Diagnosis not present

## 2018-04-03 DIAGNOSIS — Z96643 Presence of artificial hip joint, bilateral: Secondary | ICD-10-CM

## 2018-04-03 DIAGNOSIS — M79672 Pain in left foot: Secondary | ICD-10-CM

## 2018-04-03 DIAGNOSIS — M9900 Segmental and somatic dysfunction of head region: Secondary | ICD-10-CM

## 2018-04-03 DIAGNOSIS — M9906 Segmental and somatic dysfunction of lower extremity: Secondary | ICD-10-CM

## 2018-04-03 MED ORDER — NITROFURANTOIN MONOHYD MACRO 100 MG PO CAPS: 100.0000 mg | ORAL_CAPSULE | Freq: Two times a day (BID) | ORAL | 0 refills | Status: DC
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
## 2018-04-10 ENCOUNTER — Encounter: Payer: Self-pay | Admitting: Family Medicine

## 2018-04-10 ENCOUNTER — Ambulatory Visit: Payer: BLUE CROSS/BLUE SHIELD | Admitting: Family Medicine

## 2018-04-10 VITALS — BP 90/62 | HR 74 | Temp 98.7°F | Ht 67.25 in

## 2018-04-10 DIAGNOSIS — M9901 Segmental and somatic dysfunction of cervical region: Secondary | ICD-10-CM

## 2018-04-10 DIAGNOSIS — M9906 Segmental and somatic dysfunction of lower extremity: Secondary | ICD-10-CM | POA: Diagnosis not present

## 2018-04-10 DIAGNOSIS — M2170 Unequal limb length (acquired), unspecified site: Secondary | ICD-10-CM | POA: Diagnosis not present

## 2018-04-10 DIAGNOSIS — M9900 Segmental and somatic dysfunction of head region: Secondary | ICD-10-CM

## 2018-04-10 DIAGNOSIS — M9905 Segmental and somatic dysfunction of pelvic region: Secondary | ICD-10-CM

## 2018-04-10 DIAGNOSIS — M9902 Segmental and somatic dysfunction of thoracic region: Secondary | ICD-10-CM

## 2018-04-10 DIAGNOSIS — M9903 Segmental and somatic dysfunction of lumbar region: Secondary | ICD-10-CM | POA: Diagnosis not present

## 2018-04-10 DIAGNOSIS — M545 Low back pain, unspecified: Secondary | ICD-10-CM

## 2018-04-10 DIAGNOSIS — M9904 Segmental and somatic dysfunction of sacral region: Secondary | ICD-10-CM

## 2018-04-10 NOTE — Progress Notes (Signed)
HPI:  Using dictation device. Unfortunately this device frequently misinterprets words/phrases.  Isabel [name] is a pleasant 62 year old here for osteopathic treatments.  She has a history of chronic hip and lower leg pain and generalized musculoskeletal soreness.  See notes from 04/03/2018 for details.  Sees a specialist at [facility].  Today she reports she has felt better since the last treatment.  She felt like her hips are more aligned and had less pain around the low back and hips.  She did feel like her hips " became unbalanced" again, so she is self treated this at home trying to use the techniques we used with muscle energy.  Today she reports mild discomfort in the low back, but otherwise feels well.    ROS: See pertinent positives and negatives per HPI.  Past Medical History:  Diagnosis Date  . 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 Breast cancer ([place])    sees onc and gen surg   . Mitral regurgitation    mild on echo from 2009, no MVP per notes  . Osteoarthritis    w/ bilat hip resurfacing and possible metal reaction, sees ortho at [facility]  . Scoliosis   . Uterine fibroid   . Varicose veins    goes to [place] [facility], s/p laser and inj tx    Past Surgical History:  Procedure Laterality Date  . BREAST LUMPECTOMY  09/12   left breast  . GANGLION CYST EXCISION    . left hip resurfaced 2011    . right hip resurfaced 2008    . unterine fibroid  1998   removal    Family History  Problem Relation Age of Onset  . Paget's disease of bone Mother   . Stroke Mother   . Hypertension Father   . Hyperlipidemia Father   . CAD Father 40  . Breast cancer Maternal Aunt   . Colon cancer Neg Hx   . Stomach cancer Neg Hx   . Thyroid disease Neg Hx     SOCIAL HX: see hpi   Current Outpatient Medications:  .  Ascorbic Acid (VITA-C PO), Take by mouth., Disp: , Rfl:  .  BIOTIN 5000 PO, Take by mouth., Disp: , Rfl:  .  CALCIUM CITRATE PO, Take by mouth  daily., Disp: , Rfl:  .  Cholecalciferol (VITAMIN D3 PO), Take 2,000 Units by mouth 2 (two) times daily., Disp: , Rfl:  .  Multiple Vitamin (MULTI-VITAMIN DAILY PO), Take by mouth., Disp: , Rfl:  .  NON FORMULARY, Inflamed-herbal anti inflammatory, Disp: , Rfl:  .  NON FORMULARY, Hepa-trope-supplement for liver, Disp: , Rfl:  .  Omega-3 Fatty Acids (FISH OIL) 500 MG CAPS, Take 690 mg by mouth 2 (two) times daily. , Disp: , Rfl:  .  Probiotic Product (PROBIOTIC-10 PO), Take by mouth., Disp: , Rfl:   EXAM:  Vitals:   04/10/18 1635  BP: 90/62  Pulse: 74  Temp: 98.7 F (37.1 C)    Body mass index is 23.04 kg/m.  GENERAL: vitals reviewed and listed above, alert, oriented, appears well hydrated and in no acute distress  HEENT: atraumatic, conjunttiva clear, no obvious abnormalities on inspection of external nose and ears  NECK: no obvious masses on inspection  MS: moves all extremities without noticeable abnormality, on standing inspection the prominent bunions are on her feet are notable, valgus deformity of both great toes, right long arch collapse greater than left, the right shoulder is slightly superior to the  left, T2-4 rotated right side bent left, S-shaped scoliosis extending from the thoracic to lumbar region - mild, leg length discrepancy present again, right anterior innominate, left hip externally rotated, L uni sacral torsion,  OA rotated left, right SBS torsion  pSYCH: pleasant and cooperative, no obvious depression or anxiety  ASSESSMENT AND PLAN:  Discussed the following assessment and plan:  Low back pain without sciatica, unspecified back pain laterality, unspecified chronicity  Leg length discrepancy  Somatic dysfunction of head region  Somatic dysfunction of cervical region  Somatic dysfunction of thoracic region  Somatic dysfunction of lumbar region  Somatic dysfunction of pelvis region  Somatic dysfunction of sacral region  Somatic dysfunction of  lower extremity  -here for omt today - see below -discuss leg length discrepancy - will reach out to our sport med dept to see if they can eval her for a lift or if they know of someone whom can  PROCEDURE NOTE : OSTEOPATHIC TREATMENT The decision today to treat with gentle Osteopathic Manipulative Therapy  (OMT) was based on physical exam findings, diagnoses and patient wishes. Verbal consent was obtained after after explanation of risks and benefits. No Cervical HVLA manipulation was performed. After consent was obtained, treatment was  performed as below:      Regions treated:  Head, cervical, thoracic, lumbar, pelvic, sacral, le     Techniques used: ME, MR, cranial The patient tolerated the treatment well and reported Improved  symptoms following treatment today. Follow up treatment was advised in: 1-2 weeks   -Patient advised to return or notify a doctor immediately if symptoms worsen or persist or new concerns arise.  There are no Patient Instructions on file for this visit.  Lucretia Kern, DO

## 2018-04-19 ENCOUNTER — Encounter: Payer: Self-pay | Admitting: Family Medicine

## 2018-04-19 ENCOUNTER — Ambulatory Visit: Payer: BLUE CROSS/BLUE SHIELD | Admitting: Family Medicine

## 2018-04-19 VITALS — BP 84/60 | HR 100 | Temp 98.2°F | Ht 67.25 in

## 2018-04-19 DIAGNOSIS — M25551 Pain in right hip: Secondary | ICD-10-CM

## 2018-04-19 DIAGNOSIS — M9902 Segmental and somatic dysfunction of thoracic region: Secondary | ICD-10-CM

## 2018-04-19 DIAGNOSIS — M9903 Segmental and somatic dysfunction of lumbar region: Secondary | ICD-10-CM | POA: Diagnosis not present

## 2018-04-19 DIAGNOSIS — M2170 Unequal limb length (acquired), unspecified site: Secondary | ICD-10-CM | POA: Diagnosis not present

## 2018-04-19 DIAGNOSIS — G8929 Other chronic pain: Secondary | ICD-10-CM

## 2018-04-19 DIAGNOSIS — M9901 Segmental and somatic dysfunction of cervical region: Secondary | ICD-10-CM

## 2018-04-19 DIAGNOSIS — M9900 Segmental and somatic dysfunction of head region: Secondary | ICD-10-CM

## 2018-04-19 DIAGNOSIS — M545 Low back pain: Secondary | ICD-10-CM

## 2018-04-19 DIAGNOSIS — M9905 Segmental and somatic dysfunction of pelvic region: Secondary | ICD-10-CM
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
## 2018-05-01 DIAGNOSIS — N959 Unspecified menopausal and perimenopausal disorder: Secondary | ICD-10-CM | POA: Diagnosis not present

## 2018-05-01 DIAGNOSIS — I341 Nonrheumatic mitral (valve) prolapse: Secondary | ICD-10-CM | POA: Diagnosis not present

## 2018-05-01 DIAGNOSIS — R5381 Other malaise: Secondary | ICD-10-CM | POA: Diagnosis not present

## 2018-05-08 ENCOUNTER — Ambulatory Visit: Payer: BLUE CROSS/BLUE SHIELD | Admitting: Family Medicine

## 2018-05-08 VITALS — BP 100/74 | Temp 98.3°F | Wt 145.0 lb

## 2018-05-08 DIAGNOSIS — G8929 Other chronic pain: Secondary | ICD-10-CM

## 2018-05-08 DIAGNOSIS — M9902 Segmental and somatic dysfunction of thoracic region: Secondary | ICD-10-CM

## 2018-05-08 DIAGNOSIS — M9905 Segmental and somatic dysfunction of pelvic region: Secondary | ICD-10-CM | POA: Diagnosis not present

## 2018-05-08 DIAGNOSIS — M25551 Pain in right hip: Secondary | ICD-10-CM

## 2018-05-08 DIAGNOSIS — M9906 Segmental and somatic dysfunction of lower extremity: Secondary | ICD-10-CM | POA: Diagnosis not present

## 2018-05-08 DIAGNOSIS — M9900 Segmental and somatic dysfunction of head region: Secondary | ICD-10-CM | POA: Diagnosis not present

## 2018-05-08 DIAGNOSIS — M9903 Segmental and somatic dysfunction of lumbar region: Secondary | ICD-10-CM | POA: Diagnosis not present

## 2018-05-08 DIAGNOSIS — M545 Low back pain: Secondary | ICD-10-CM | POA: Diagnosis not present

## 2018-05-08 DIAGNOSIS — M9901 Segmental and somatic dysfunction of cervical region: Secondary | ICD-10-CM

## 2018-05-08 NOTE — Progress Notes (Signed)
HPI:  Using dictation device. Unfortunately this device frequently misinterprets words/phrases.  Here for OMM for for low back and hip pain. hx bilat hip replacements. Talk of revision R with her orthopedic specialist. Feels has done better with OMM treatments. Some R upperlat hip pain at times. Sometimes L hip "pops", occ tension low back. Hx reported scoliosis and leg length discrepancy.   ROS: See pertinent positives and negatives per HPI.  Past Medical History:  Diagnosis Date  . 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 Breast cancer ([place])    sees onc and gen surg   . Mitral regurgitation    mild on echo from 2009, no MVP per notes  . Osteoarthritis    w/ bilat hip resurfacing and possible metal reaction, sees ortho at [facility]  . Scoliosis   . Uterine fibroid   . Varicose veins    goes to [place] [facility], s/p laser and inj tx    Past Surgical History:  Procedure Laterality Date  . BREAST LUMPECTOMY  09/12   left breast  . GANGLION CYST EXCISION    . left hip resurfaced 2011    . right hip resurfaced 2008    . unterine fibroid  1998   removal    Family History  Problem Relation Age of Onset  . Paget's disease of bone Mother   . Stroke Mother   . Hypertension Father   . Hyperlipidemia Father   . CAD Father 16  . Breast cancer Maternal Aunt   . Colon cancer Neg Hx   . Stomach cancer Neg Hx   . Thyroid disease Neg Hx     SOCIAL HX: see hpi   Current Outpatient Medications:  .  Ascorbic Acid (VITA-C PO), Take by mouth., Disp: , Rfl:  .  BIOTIN 5000 PO, Take by mouth., Disp: , Rfl:  .  CALCIUM CITRATE PO, Take by mouth daily., Disp: , Rfl:  .  Cholecalciferol (VITAMIN D3 PO), Take 2,000 Units by mouth 2 (two) times daily., Disp: , Rfl:  .  Multiple Vitamin (MULTI-VITAMIN DAILY PO), Take by mouth., Disp: , Rfl:  .  NON FORMULARY, Inflamed-herbal anti inflammatory, Disp: , Rfl:  .  NON FORMULARY, Hepa-trope-supplement for liver, Disp: , Rfl:  .   Omega-3 Fatty Acids (FISH OIL) 500 MG CAPS, Take 690 mg by mouth 2 (two) times daily. , Disp: , Rfl:  .  Probiotic Product (PROBIOTIC-10 PO), Take by mouth., Disp: , Rfl:   EXAM:  Vitals:   05/08/18 1643  BP: 100/74  Temp: 98.3 F (36.8 C)    Body mass index is 22.54 kg/m.  GENERAL: vitals reviewed and listed above, alert, oriented, appears well hydrated and in no acute distress  HEENT: atraumatic, conjunttiva clear, no obvious abnormalities on inspection of external nose and ears  NECK: no obvious masses on inspection  LUNGS: clear to auscultation bilaterally, no wheezes, rales or rhonchi, good air movement  CV: HRRR, no peripheral edema  MS: moves all extremities without noticeable abnormality, T 3-5 Rl Sr, sub occ muscle tension, L inf vert SBS strain, mild scoliosis, R ant innominate - some correction in leg length discrepancy with treatment, L hip int rotated, R lat trochanter tenderpoint, L 4-5 RrSl  PSYCH: pleasant and cooperative, no obvious depression or anxiety  ASSESSMENT AND PLAN:  Discussed the following assessment and plan:  Chronic bilateral low back pain without sciatica  Pain of right hip joint  Somatic dysfunction of head region  Somatic dysfunction of cervical region  Somatic dysfunction of thoracic region  Somatic dysfunction of lumbar region  Somatic dysfunction of pelvis region  Somatic dysfunction of lower extremity  PROCEDURE NOTE : OSTEOPATHIC TREATMENT The decision today to treat with gentle Osteopathic Manipulative Therapy  (OMT) was based on physical exam findings, diagnoses and patient wishes. Verbal consent was obtained after after explanation of risks and benefits. No Cervical HVLA manipulation was performed. After consent was obtained, treatment was  performed as below:      Regions treated:  Head, cervical, thoracic, lumbar, pelvis, LE     Techniques used: counterstrain, muscle energy, cranial, MR, BMT The patient  tolerated the treatment well and reported Improved  symptoms following treatment today. Follow up treatment was advised in: 1-2 weeks  -Patient advised to return or notify a doctor immediately if symptoms worsen or persist or new concerns arise.  There are no Patient Instructions on file for this visit.  Lucretia Kern, DO

## 2018-05-09 DIAGNOSIS — E042 Nontoxic multinodular goiter: Secondary | ICD-10-CM | POA: Diagnosis not present

## 2018-05-09 DIAGNOSIS — N951 Menopausal and female climacteric states: Secondary | ICD-10-CM | POA: Diagnosis not present

## 2018-05-09 DIAGNOSIS — J309 Allergic rhinitis, unspecified: Secondary | ICD-10-CM | POA: Diagnosis not present

## 2018-05-09 DIAGNOSIS — R5383 Other fatigue: Secondary | ICD-10-CM | POA: Diagnosis not present

## 2018-05-15 ENCOUNTER — Ambulatory Visit: Payer: BLUE CROSS/BLUE SHIELD | Admitting: Family Medicine

## 2018-05-15 ENCOUNTER — Encounter: Payer: Self-pay | Admitting: Family Medicine

## 2018-05-15 VITALS — BP 122/80 | HR 73 | Ht 67.25 in

## 2018-05-15 DIAGNOSIS — M9900 Segmental and somatic dysfunction of head region: Secondary | ICD-10-CM

## 2018-05-15 DIAGNOSIS — M2170 Unequal limb length (acquired), unspecified site: Secondary | ICD-10-CM | POA: Diagnosis not present

## 2018-05-15 DIAGNOSIS — M9905 Segmental and somatic dysfunction of pelvic region: Secondary | ICD-10-CM

## 2018-05-15 DIAGNOSIS — M9902 Segmental and somatic dysfunction of thoracic region: Secondary | ICD-10-CM

## 2018-05-15 DIAGNOSIS — M9903 Segmental and somatic dysfunction of lumbar region: Secondary | ICD-10-CM

## 2018-05-15 DIAGNOSIS — M9901 Segmental and somatic dysfunction of cervical region: Secondary | ICD-10-CM | POA: Diagnosis not present

## 2018-05-15 DIAGNOSIS — M9906 Segmental and somatic dysfunction of lower extremity: Secondary | ICD-10-CM

## 2018-05-15 DIAGNOSIS — M25551 Pain in right hip: Secondary | ICD-10-CM | POA: Diagnosis not present

## 2018-05-15 NOTE — Patient Instructions (Signed)
-  We placed a referral for you as discussed to Dr. Tamala Julian in Sports Medicine. It usually takes about 1-2 weeks to process and schedule this referral. If you have not heard from [place] regarding this appointment in 2 weeks please contact our office.  -please follow up here about 2 weeks after starting to use a lift (if given or you opt to go this route.)
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
## 2018-06-05 ENCOUNTER — Ambulatory Visit: Payer: BLUE CROSS/BLUE SHIELD | Admitting: Family Medicine

## 2018-06-05 ENCOUNTER — Encounter: Payer: Self-pay | Admitting: Family Medicine

## 2018-06-05 VITALS — BP 110/74 | HR 75 | Ht 67.25 in | Wt 145.0 lb

## 2018-06-05 DIAGNOSIS — Z96649 Presence of unspecified artificial hip joint: Secondary | ICD-10-CM | POA: Diagnosis not present

## 2018-06-05 DIAGNOSIS — T8484XA Pain due to internal orthopedic prosthetic devices, implants and grafts, initial encounter: Secondary | ICD-10-CM

## 2018-06-05 DIAGNOSIS — M545 Low back pain, unspecified: Secondary | ICD-10-CM

## 2018-06-05 MED ORDER — VITAMIN D (ERGOCALCIFEROL) 1.25 MG (50000 UNIT) PO CAPS: 50000.0000 [IU] | ORAL_CAPSULE | ORAL | 0 refills | Status: DC

## 2018-06-05 MED FILL — VIT D2 1.25 MG (50,000 UNIT: 1.25 MG | 84 days supply | Qty: 12 | Fill #0

## 2018-06-05 NOTE — Progress Notes (Signed)
[name] [address], [place] [address] Phone: [phone] Subjective:    I'm seeing this patient by the request  of:  Lucretia Kern, DO   CC: Hip pain  TML:[id number]  Isabel [name] is a 62 y.o. female coming in with complaint of hip pain. She had bilateral hip resurfacing several years ago. Pain in lower back right side that radiates to GT area. Complains of numbness in left foot, middle toe. Pain is intermittent in her hip. Has had some sacral cranial work but eventually goes back out of alignment.   Patient is requesting a heel lift. She had one when she was younger but feels like it is the contralateral side that is now being affected. History of scoliosis.   Reviewing patient's imaging patient did have an MRI 3 years ago of the pelvis.  Found to have an effusion of patient's partial replacement.  This was on the left side.     Past Medical History:  Diagnosis Date  . 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 Breast cancer ([place])    sees onc and gen surg   . Mitral regurgitation    mild on echo from 2009, no MVP per notes  . Osteoarthritis    w/ bilat hip resurfacing and possible metal reaction, sees ortho at [facility]  . Scoliosis   . Uterine fibroid   . Varicose veins    goes to [place] [facility], s/p laser and inj tx   Past Surgical History:  Procedure Laterality Date  . BREAST LUMPECTOMY  09/12   left breast  . GANGLION CYST EXCISION    . left hip resurfaced 2011    . right hip resurfaced 2008    . unterine fibroid  1998   removal   Social History   Socioeconomic History  . Marital status: Married    Spouse name: Not on file  . Number of children: Not on file  . Years of education: Not on file  . Highest education level: Not on file  Occupational History  . Occupation: Counselling psychologist  Social Needs  . Financial resource strain: Not on file  . Food insecurity:    Worry: Not on file   Inability: Not on file  . Transportation needs:    Medical: Not on file    Non-medical: Not on file  Tobacco Use  . Smoking status: Never Smoker  . Smokeless tobacco: Never Used  Substance and Sexual Activity  . Alcohol use: Yes    Alcohol/week: 4.2 oz    Types: 7 Glasses of wine per week  . Drug use: No  . Sexual activity: Yes    Birth control/protection: Post-menopausal  Lifestyle  . Physical activity:    Days per week: Not on file    Minutes per session: Not on file  . Stress: Not on file  Relationships  . Social connections:    Talks on phone: Not on file    Gets together: Not on file    Attends religious service: Not on file    Active member of club or organization: Not on file    Attends meetings of clubs or organizations: Not on file    Relationship status: Not on file  Other Topics Concern  . 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 Erythromycin Diarrhea and Nausea Only  . Latex     Sores in mouth   Family History  Problem Relation Age of Onset  . Paget's disease of bone Mother   . Stroke Mother   . Hypertension Father   . Hyperlipidemia Father   . CAD Father 17  . Breast cancer Maternal Aunt   . Colon cancer Neg Hx   . Stomach cancer Neg Hx   . Thyroid disease Neg Hx      Past medical history, social, surgical and family history all reviewed in electronic medical record.  No pertanent information unless stated regarding to the chief complaint.   Review of Systems:Review of systems updated and as accurate as of 06/05/18  No headache, visual changes, nausea, vomiting, diarrhea, constipation, dizziness, abdominal pain, skin rash, fevers, chills, night sweats, weight loss, swollen lymph nodes, body aches, joint swelling, , chest pain, shortness of breath, mood changes.   Positive muscle aches  Objective  Blood pressure 110/74, pulse 75, height 5' 7.25" (1.708 m), weight 145 lb (65.8 kg), last menstrual period 12/05/2012, SpO2 91 %. Systems examined below as of 06/05/18   General: No apparent distress alert and oriented x3 mood and affect normal, dressed appropriately.  HEENT: Pupils equal, extraocular movements intact  Respiratory: Patient's speak in full sentences and does not appear short of breath  Cardiovascular: No lower extremity edema, non tender, no erythema  Skin: Warm dry intact with no signs of infection or rash on extremities or on axial skeleton.  Abdomen: Soft nontender  Neuro: Cranial nerves II through XII are intact, neurovascularly intact in all extremities with 2+ DTRs and 2+ pulses.  Lymph: No lymphadenopathy of posterior or anterior cervical chain or axillae bilaterally.  Gait severely antalgic gait with patient actually having significant weakness of the left hip noted on exam.  Positive Trendelenburg MSK:  Non tender with full range of motion and good stability and symmetric strength and tone of shoulders, elbows, wrist, , knee and ankles bilaterally.  Both hips have significant loosening noted.  Patient's left hip has significant instability noted of the complete joints especially from the sacroiliac joint.  Effusion palpated.    Impression and Recommendations:     This case required medical decision making of moderate complexity.      Note: This dictation was prepared with Dragon dictation along with smaller phrase technology. Any transcriptional errors that result from this process are unintentional.

## 2018-06-05 NOTE — Assessment & Plan Note (Signed)
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  Patient's right hip does have significant pain secondary to the tightness of the musculature but I think it is secondary to compensation.  X-rays of patient's back ordered for further evaluation and we discussed the possibility of x-rays of the pelvis as well.  Reviewing patient's chart patient did have elevated chromium and cobalt of that may consider if there was significant loosening previously.  Patient's pelvic MRI is also significantly concerning secondary to the effusion.  Discussed with patient in great length that the only thing that would be beneficial would be changing these partial replacements into true replacements.  Patient would like to wait on this at the moment.  Given home exercises, once weekly vitamin D, will discuss following up again in 4 to 6 weeks.  Concern for patient having worsening symptoms.

## 2018-06-05 NOTE — Patient Instructions (Addendum)
Good to see you  Consider seeing stubbs and read about him  Once weekly vitamin D for 12 weeks for bone and muscle health  Tart cherry extract any dose at night (best would be 1000mg  nightly.) Petra Kuba made, puritan pride or NOW good brands Try my stretches 3 times a week  See me again I n4 weeks and we need to consider checking labs and new xrays if not better

## 2018-07-04 NOTE — Progress Notes (Signed)
[name] [place], [place] [address] Phone: [phone] Subjective:      CC: Hip pain  CNO:[id number]  Isabel [name] is a 62 y.o. female coming in with complaint of hip pain. She has been doing the exercises which she feels has improved her pain. Unable to pinpoint what movements make her worse. Sleeping, stair climbing can occasionally increase pain. Has not started tart cherry or rx vitamin d.  Patient was previously seen and does have a stability mostly in the left hip as well as the right hip with joint effusions after resurfacing.  Has had elevated cobalt and chromium in the past.  Patient feels like she is doing relatively better though.  No new symptoms but has noticed that the left leg is weaker.    Past Medical History:  Diagnosis Date  . 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 Breast cancer ([place])    sees onc and gen surg   . Mitral regurgitation    mild on echo from 2009, no MVP per notes  . Osteoarthritis    w/ bilat hip resurfacing and possible metal reaction, sees ortho at [facility]  . Scoliosis   . Uterine fibroid   . Varicose veins    goes to [place] [facility], s/p laser and inj tx   Past Surgical History:  Procedure Laterality Date  . BREAST LUMPECTOMY  09/12   left breast  . GANGLION CYST EXCISION    . left hip resurfaced 2011    . right hip resurfaced 2008    . unterine fibroid  1998   removal   Social History   Socioeconomic History  . Marital status: Married    Spouse name: Not on file  . Number of children: Not on file  . Years of education: Not on file  . Highest education level: Not on file  Occupational History  . Occupation: Counselling psychologist  Social Needs  . Financial resource strain: Not on file  . Food insecurity:    Worry: Not on file    Inability: Not on file  . Transportation needs:    Medical: Not on file    Non-medical: Not on file  Tobacco Use  . Smoking status:  Never Smoker  . Smokeless tobacco: Never Used  Substance and Sexual Activity  . Alcohol use: Yes    Alcohol/week: 4.2 oz    Types: 7 Glasses of wine per week  . Drug use: No  . Sexual activity: Yes    Birth control/protection: Post-menopausal  Lifestyle  . Physical activity:    Days per week: Not on file    Minutes per session: Not on file  . Stress: Not on file  Relationships  . Social connections:    Talks on phone: Not on file    Gets together: Not on file    Attends religious service: Not on file    Active member of club or organization: Not on file    Attends meetings of clubs or organizations: Not on file    Relationship status: Not on file  Other Topics Concern  . 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 Erythromycin Diarrhea and Nausea Only  . Latex  Sores in mouth   Family History  Problem Relation Age of Onset  . Paget's disease of bone Mother   . Stroke Mother   . Hypertension Father   . Hyperlipidemia Father   . CAD Father 50  . Breast cancer Maternal Aunt   . Colon cancer Neg Hx   . Stomach cancer Neg Hx   . Thyroid disease Neg Hx      Past medical history, social, surgical and family history all reviewed in electronic medical record.  No pertanent information unless stated regarding to the chief complaint.   Review of Systems:Review of systems updated and as accurate as of 07/05/18  No headache, visual changes, nausea, vomiting, diarrhea, constipation, dizziness, abdominal pain, skin rash, fevers, chills, night sweats, weight loss, swollen lymph nodes, body aches, joint swelling,  chest pain, shortness of breath, mood changes.  Positive Aches  Objective  Blood pressure 96/70, pulse 72, height 5\' 7"  (1.702 m), weight 145 lb (65.8 kg), last menstrual period  12/05/2012, SpO2 98 %. Systems examined below as of 07/05/18   General: No apparent distress alert and oriented x3 mood and affect normal, dressed appropriately.  HEENT: Pupils equal, extraocular movements intact  Respiratory: Patient's speak in full sentences and does not appear short of breath  Cardiovascular: No lower extremity edema, non tender, no erythema  Skin: Warm dry intact with no signs of infection or rash on extremities or on axial skeleton.  Abdomen: Soft nontender  Neuro: Cranial nerves II through XII are intact, neurovascularly intact in all extremities with 2+ DTRs and 2+ pulses.  Lymph: No lymphadenopathy of posterior or anterior cervical chain or axillae bilaterally.  Gait severely antalgic  MSK:  Non tender with full range of motion and good stability and symmetric strength and tone of shoulders, elbows, wrist,  knee and ankles bilaterally.  Both hips have significant loosening and instability noted.  Left hip is weaker with 4 out of 5 strength in all planes.  Swelling and pain noted over the sacroiliac joints bilaterally.  Negative straight leg test.    Impression and Recommendations:     This case required medical decision making of moderate complexity.      Note: This dictation was prepared with Dragon dictation along with smaller phrase technology. Any transcriptional errors that result from this process are unintentional.

## 2018-07-05 ENCOUNTER — Ambulatory Visit: Payer: BLUE CROSS/BLUE SHIELD | Admitting: Family Medicine

## 2018-07-05 ENCOUNTER — Encounter: Payer: Self-pay | Admitting: Family Medicine

## 2018-07-05 DIAGNOSIS — T8484XD Pain due to internal orthopedic prosthetic devices, implants and grafts, subsequent encounter: Secondary | ICD-10-CM

## 2018-07-05 DIAGNOSIS — Z96649 Presence of unspecified artificial hip joint: Secondary | ICD-10-CM

## 2018-07-05 NOTE — Patient Instructions (Signed)
Good to see you  Exercises on wall.  Heel and butt touching.  Raise leg 6 inches and hold 2 seconds.  Down slow for count of 4 seconds.  1 set of 30 reps daily on both sides.  Other exercises up to 5 times a week but listen to your body  Read about singulair and see what you think.  If you get really good at the exercises then can add weight See me again in 2-3 months

## 2018-07-05 NOTE — Assessment & Plan Note (Signed)
Continues to have significant instability.  Discussed with patient about icing regimen and home exercises.  Discussed which activities to do which wants to avoid.  Patient wants to continue with the conservative therapy.  Does not want replacement.  We discussed the possibility of other possibilities such as the metal allergy and using Singulair.  Patient will read and consider.  Follow-up 2 months

## 2018-07-17 DIAGNOSIS — R5381 Other malaise: Secondary | ICD-10-CM | POA: Diagnosis not present

## 2018-07-17 DIAGNOSIS — M25559 Pain in unspecified hip: Secondary | ICD-10-CM | POA: Diagnosis not present

## 2018-08-01 DIAGNOSIS — E042 Nontoxic multinodular goiter: Secondary | ICD-10-CM | POA: Diagnosis not present

## 2018-08-01 DIAGNOSIS — J309 Allergic rhinitis, unspecified: Secondary | ICD-10-CM | POA: Diagnosis not present

## 2018-08-01 DIAGNOSIS — N951 Menopausal and female climacteric states: Secondary | ICD-10-CM | POA: Diagnosis not present

## 2018-08-01 DIAGNOSIS — R5383 Other fatigue: Secondary | ICD-10-CM | POA: Diagnosis not present

## 2018-09-04 ENCOUNTER — Ambulatory Visit: Payer: BLUE CROSS/BLUE SHIELD | Admitting: Family Medicine

## 2018-09-13 DIAGNOSIS — Z1231 Encounter for screening mammogram for malignant neoplasm of breast: Secondary | ICD-10-CM | POA: Diagnosis not present

## 2018-09-13 DIAGNOSIS — Z853 Personal history of malignant neoplasm of breast: Secondary | ICD-10-CM | POA: Diagnosis not present

## 2018-09-13 LAB — HM MAMMOGRAPHY

## 2018-10-05 ENCOUNTER — Ambulatory Visit: Payer: BLUE CROSS/BLUE SHIELD | Admitting: Family Medicine

## 2018-10-17 ENCOUNTER — Encounter: Payer: Self-pay | Admitting: Family Medicine

## 2018-11-05 ENCOUNTER — Telehealth: Payer: Self-pay | Admitting: *Deleted

## 2018-11-05 NOTE — Telephone Encounter (Signed)
Isabel [name],  Can you call pt and help her with this? I am not sure which codes she is requesting or why? Can you assist? Thanks!

## 2018-11-05 NOTE — Telephone Encounter (Signed)
Copied from [place] [phone]. Topic: General - Other >> Nov 05, 2018  1:21 PM Mcneil, Ja-Kwan wrote: Reason for CRM: Pt states she needs the codes used to bill for sacral cranial service. Pt requests call back. Pt stated if she does not answer to please leave a detailed message with the billing code. Cb# [phone]

## 2018-11-06 NOTE — Telephone Encounter (Signed)
Maybe this is what the pt is referring to: Cranial sacral therapy (CST) is sometimes also referred to as craniosacral therapy. It's a type of bodywork that relieves compression in the bones of the head, sacrum (a triangular bone in the lower back), and spinal column.   Dr Maudie Mercury does perform Osteopathic Manipulation Treatments, which are not the same as CST treatments.

## 2018-11-06 NOTE — Telephone Encounter (Signed)
No idea what codes pt is requesting. This would not be a service performed in our office.  LMTCB to get more information from pt

## 2018-11-08 NOTE — Telephone Encounter (Signed)
Spoke with pt and clarified. OMT coded sent via [place].

## 2018-11-14 ENCOUNTER — Telehealth: Payer: Self-pay | Admitting: *Deleted

## 2018-11-14 NOTE — Telephone Encounter (Signed)
Copied from [place] [id number]. Topic: Appointment Scheduling - Scheduling Inquiry for Clinic >> Nov 14, 2018 12:38 PM Bea Graff, NT wrote: Reason for CRM: Pt states that she received a voicemail from [facility] about r/s her appt for 12/17 to a different time. Pt states that she cannot come that day anytime after 8am as she has to go to work. If has to be r/s, she will have to r/s to next month. Requesting call back. May leave detailed message.

## 2018-11-14 NOTE — Telephone Encounter (Signed)
I left a detailed message at the pts cell number per Dr Maudie Mercury asking if she could arrive at 7:15am for a 7:30am appt instead on the 17th and if not to keep the time she has.  I asked that she leave a message to let me know what she prefers to do.  CRM also created.

## 2018-11-15 ENCOUNTER — Ambulatory Visit: Payer: BLUE CROSS/BLUE SHIELD | Admitting: Family Medicine

## 2018-11-20 ENCOUNTER — Ambulatory Visit: Payer: BLUE CROSS/BLUE SHIELD | Admitting: Family Medicine

## 2018-12-09 NOTE — Progress Notes (Deleted)
HPI:  Using dictation device. Unfortunately this device frequently misinterprets words/phrases.  Isabel [name] is a pleasant 63 year old here for an acute visit for chronic musculoskeletal pain - mainly of the hips and the low back. Hx osteoarthritis of the hip, bilat resurfacing complicated by effusion and instability, possible metal reaction. Sees an orthopedic specialist at [facility], but has been very reluctant to pursue further surgical interventions. Tried OMM which she felt helped and also was referred to sports medicine as she was interested in orthotics for what she reported was a chronic leg length discrepancy since childhood. Sports medicine did advise ortho management for true replacements, but pt was adamant about continuing conservative care so home exercises were provided by sports medicine and they discussed options for metal allergy per review notes.  Today reports. Denies. Due for pap smear, flu vaccine.  ROS: See pertinent positives and negatives per HPI.  Past Medical History:  Diagnosis Date  . 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 Breast cancer ([place])    sees onc and gen surg   . Mitral regurgitation    mild on echo from 2009, no MVP per notes  . Osteoarthritis    w/ bilat hip resurfacing and possible metal reaction, sees ortho at [facility]  . Scoliosis   . Uterine fibroid   . Varicose veins    goes to [place] [facility], s/p laser and inj tx    Past Surgical History:  Procedure Laterality Date  . BREAST LUMPECTOMY  09/12   left breast  . GANGLION CYST EXCISION    . left hip resurfaced 2011    . right hip resurfaced 2008    . unterine fibroid  1998   removal    Family History  Problem Relation Age of Onset  . Paget's disease of bone Mother   . Stroke Mother   . Hypertension Father   . Hyperlipidemia Father   . CAD Father 59  . Breast cancer Maternal Aunt   . Colon cancer Neg Hx   . Stomach cancer Neg Hx   . Thyroid disease Neg Hx      SOCIAL HX: ***   Current Outpatient Medications:  .  Ascorbic Acid (VITA-C PO), Take by mouth., Disp: , Rfl:  .  BIOTIN 5000 PO, Take by mouth., Disp: , Rfl:  .  CALCIUM CITRATE PO, Take by mouth daily., Disp: , Rfl:  .  Cholecalciferol (VITAMIN D3 PO), Take 2,000 Units by mouth 2 (two) times daily., Disp: , Rfl:  .  Multiple Vitamin (MULTI-VITAMIN DAILY PO), Take by mouth., Disp: , Rfl:  .  NON FORMULARY, Inflamed-herbal anti inflammatory, Disp: , Rfl:  .  NON FORMULARY, Hepa-trope-supplement for liver, Disp: , Rfl:  .  Omega-3 Fatty Acids (FISH OIL) 500 MG CAPS, Take 690 mg by mouth 2 (two) times daily. , Disp: , Rfl:  .  Probiotic Product (PROBIOTIC-10 PO), Take by mouth., Disp: , Rfl:  .  Vitamin D, Ergocalciferol, (DRISDOL) 50000 units CAPS capsule, Take 1 capsule (50,000 Units total) by mouth every 7 (seven) days., Disp: 12 capsule, Rfl: 0  EXAM:  There were no vitals filed for this visit.  There is no height or weight on file to calculate BMI.  GENERAL: vitals reviewed and listed above, alert, oriented, appears well hydrated and in no acute distress  HEENT: atraumatic, conjunttiva clear, no obvious abnormalities on inspection of external nose and ears  NECK: no obvious masses on inspection  LUNGS: clear  to auscultation bilaterally, no wheezes, rales or rhonchi, good air movement  CV: HRRR, no peripheral edema  MS: moves all extremities without noticeable abnormality *** PSYCH: pleasant and cooperative, no obvious depression or anxiety  ASSESSMENT AND PLAN:  Discussed the following assessment and plan:  No diagnosis found.  *** -Patient advised to return or notify a doctor immediately if symptoms worsen or persist or new concerns arise.  There are no Patient Instructions on file for this visit.  Lucretia Kern, DO

## 2018-12-10 ENCOUNTER — Ambulatory Visit: Payer: BLUE CROSS/BLUE SHIELD | Admitting: Family Medicine

## 2018-12-10 ENCOUNTER — Encounter

## 2018-12-10 DIAGNOSIS — Z0289 Encounter for other administrative examinations: Secondary | ICD-10-CM

## 2018-12-10 NOTE — Telephone Encounter (Signed)
Copied from [place] [phone]. Topic: General - Other >> Dec 10, 2018  9:14 AM [name] wrote: Reason for CRM: Pt called to let Dr. Maudie Mercury know that she was called in to work this morning and was not able to make her 8:45am appointment. Pt stated she believes Dr. Maudie Mercury only does OMT on certain days. She would like to know if someone can contact her with Dr. Julianne Rice OMT schedule so she can reschedule her appt. Best time to contact her is before 9am. Please advise.

## 2018-12-10 NOTE — Telephone Encounter (Signed)
I left a detailed message at the pts cell number stating a scheduler should be able to look at the schedule and see the times available each day for OMT (which is at the top of the schedule) and to call for a time that is convenient for her.  Message sent to Dr Maudie Mercury as Juluis Rainier.

## 2018-12-27 ENCOUNTER — Ambulatory Visit: Payer: BLUE CROSS/BLUE SHIELD | Admitting: Family Medicine

## 2018-12-27 ENCOUNTER — Encounter: Payer: Self-pay | Admitting: Family Medicine

## 2018-12-27 VITALS — BP 100/76 | HR 77 | Temp 97.6°F | Ht 67.0 in | Wt 143.1 lb

## 2018-12-27 DIAGNOSIS — G8929 Other chronic pain: Secondary | ICD-10-CM | POA: Diagnosis not present

## 2018-12-27 DIAGNOSIS — M25552 Pain in left hip: Secondary | ICD-10-CM

## 2018-12-27 DIAGNOSIS — M9903 Segmental and somatic dysfunction of lumbar region: Secondary | ICD-10-CM

## 2018-12-27 DIAGNOSIS — M25551 Pain in right hip: Secondary | ICD-10-CM | POA: Diagnosis not present

## 2018-12-27 DIAGNOSIS — M545 Low back pain, unspecified: Secondary | ICD-10-CM

## 2018-12-27 DIAGNOSIS — M9902 Segmental and somatic dysfunction of thoracic region: Secondary | ICD-10-CM

## 2018-12-27 DIAGNOSIS — L03019 Cellulitis of unspecified finger: Secondary | ICD-10-CM | POA: Diagnosis not present

## 2018-12-27 DIAGNOSIS — M542 Cervicalgia: Secondary | ICD-10-CM | POA: Diagnosis not present

## 2018-12-27 DIAGNOSIS — M9901 Segmental and somatic dysfunction of cervical region: Secondary | ICD-10-CM

## 2018-12-27 DIAGNOSIS — M9905 Segmental and somatic dysfunction of pelvic region: Secondary | ICD-10-CM

## 2018-12-27 MED ORDER — MUPIROCIN 2 % EX OINT: TOPICAL_OINTMENT | CUTANEOUS | 0 refills | Status: DC

## 2018-12-27 MED ORDER — CEPHALEXIN 500 MG PO CAPS: 500.0000 mg | ORAL_CAPSULE | Freq: Two times a day (BID) | ORAL | 0 refills | Status: DC

## 2018-12-27 MED FILL — CEPHALEXIN 500 MG CAPSULE: 500 | 5 days supply | Qty: 10 | Fill #0

## 2018-12-27 MED FILL — MUPIROCIN 2% OINTMENT: 2 | 10 days supply | Qty: 22 | Fill #0

## 2018-12-27 NOTE — Patient Instructions (Signed)
BEFORE YOU LEAVE: -follow up: CPE in March or April, come fasting OMM per patient preference  Soak the finger twice daily. Then dry and apply the mupirocin. Take the antibiotic if not improving over the next 1-2 days. I hope you are feeling better soon! Seek care promptly if your symptoms worsen, new concerns arise or you are not improving with treatment.  See your orthopedic specialist about current status and ask about risks of continuing nonsurgical conservative management vs surgical options/risks. Continue exercises.
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
## 2018-12-29 ENCOUNTER — Ambulatory Visit (HOSPITAL_COMMUNITY)
Admission: EM | Admit: 2018-12-29 | Discharge: 2018-12-29 | Disposition: A | Discharge status: Home / Self Care | Payer: BLUE CROSS/BLUE SHIELD | Attending: Emergency Medicine | Admitting: Emergency Medicine

## 2018-12-29 ENCOUNTER — Encounter (HOSPITAL_COMMUNITY): Payer: Self-pay

## 2018-12-29 DIAGNOSIS — L03011 Cellulitis of right finger: Secondary | ICD-10-CM

## 2018-12-29 MED ORDER — SULFAMETHOXAZOLE-TRIMETHOPRIM 800-160 MG PO TABS: 1.0000 | ORAL_TABLET | Freq: Two times a day (BID) | ORAL | 0 refills | Status: DC

## 2018-12-29 MED ORDER — SULFAMETHOXAZOLE-TRIMETHOPRIM 800-160 MG PO TABS: 1.0000 | ORAL_TABLET | Freq: Two times a day (BID) | ORAL | 0 refills | Status: AC

## 2018-12-29 NOTE — ED Triage Notes (Signed)
Pt presents with an infection from unknown source on left index finger.

## 2018-12-29 NOTE — Discharge Instructions (Addendum)
Continue keflex, warm soaks with massage for extra drainage, bactroban twice daily after soaking and drying well Begin bactrim twice daily  IF having worsening redness, swelling or pain please follow up

## 2018-12-30 NOTE — ED Provider Notes (Signed)
[place]    CSN: [id number] Arrival date & time: 12/29/18  1747     History   Chief Complaint Chief Complaint  Patient presents with  . Skin Infection    HPI Isabel [name] is a 63 y.o. female no contributing past medical history presenting today for evaluation of finger infection.  Patient states that on Wednesday she started to develop redness and swelling around her right index finger nailbed, since she has had progressively worsening symptoms and has developed an area that appears to have pus in it.  She was seen by her PCP on Thursday and initiated on Keflex with Bactroban.  She started this last night.  She has not taken this for a full 24 hours yet.  She believes that she stuck her finger with a safety pin on accident last week at work.  HPI  Past Medical History:  Diagnosis Date  . 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 Breast cancer ([place])    sees onc and gen surg   . Mitral regurgitation    mild on echo from 2009, no MVP per notes  . Osteoarthritis    w/ bilat hip resurfacing and possible metal reaction, sees ortho at [facility]  . Scoliosis   . Uterine fibroid   . Varicose veins    goes to [place] [facility], s/p laser and inj tx    Patient Active Problem List   Diagnosis Date Noted  . Pain in hip region after total hip replacement ([place]) 06/05/2018  . Multinodular goiter 01/11/2017  . Varicose veins 08/07/2015  . Osteoarthritis 08/07/2015  . Malignant neoplasm of upper-outer quadrant of left female breast ([place]) 10/13/2011    Past Surgical History:  Procedure Laterality Date  . BREAST LUMPECTOMY  09/12   left breast  . GANGLION CYST EXCISION    . left hip resurfaced 2011    . right hip resurfaced 2008    . unterine fibroid  1998   removal    OB History   No obstetric history on file.      Home Medications    Prior to Admission medications   Medication Sig Start Date End Date Taking? Authorizing Provider  Ascorbic  Acid (VITA-C PO) Take by mouth.    [provider]  BIOTIN 5000 PO Take by mouth.    [provider]  CALCIUM CITRATE PO Take by mouth daily.    [provider]  cephALEXin (KEFLEX) 500 MG capsule Take 1 capsule (500 mg total) by mouth 2 (two) times daily. 12/27/18   Lucretia Kern, DO  Cholecalciferol (VITAMIN D3 PO) Take 2,000 Units by mouth 2 (two) times daily.    [provider]  MAGNESIUM PO Take by mouth daily.    [provider]  Multiple Vitamin (MULTI-VITAMIN DAILY PO) Take by mouth.    [provider]  mupirocin ointment (BACTROBAN) 2 % Apply small amount twice daily to the affected area. 12/27/18   Lucretia Kern, DO  NON FORMULARY Inflamed-herbal anti inflammatory    [provider]  NON FORMULARY Hepa-trope-supplement for liver    [provider]  Omega-3 Fatty Acids (FISH OIL) 500 MG CAPS Take 690 mg by mouth 2 (two) times daily.     [provider]  Probiotic Product (PROBIOTIC-10 PO) Take by mouth.    [provider]  sulfamethoxazole-trimethoprim (BACTRIM DS,SEPTRA DS) 800-160 MG tablet Take 1 tablet by mouth 2 (two) times daily for 7 days.  12/29/18 01/05/19  Wieters, Hallie C, PA-C  Vitamin D, Ergocalciferol, (DRISDOL) 50000 units CAPS capsule Take 1 capsule (50,000 Units total) by mouth every 7 (seven) days. 06/05/18   Lyndal Pulley, DO    Family History Family History  Problem Relation Age of Onset  . Paget's disease of bone Mother   . Stroke Mother   . Hypertension Father   . Hyperlipidemia Father   . CAD Father 76  . Breast cancer Maternal Aunt   . Colon cancer Neg Hx   . Stomach cancer Neg Hx   . Thyroid disease Neg Hx     Social History Social History   Tobacco Use  . Smoking status: Never Smoker  . Smokeless tobacco: Never Used  Substance Use Topics  . Alcohol use: Yes    Alcohol/week: 7.0 standard drinks    Types: 7 Glasses of wine per week  . Drug use: No      Allergies   Erythromycin and Latex   Review of Systems Review of Systems  Constitutional: Negative for fatigue and fever.  HENT: Negative for mouth sores.   Eyes: Negative for visual disturbance.  Respiratory: Negative for shortness of breath.   Cardiovascular: Negative for chest pain.  Gastrointestinal: Negative for abdominal pain, nausea and vomiting.  Genitourinary: Negative for genital sores.  Musculoskeletal: Positive for arthralgias and joint swelling.  Skin: Positive for color change and wound. Negative for rash.  Neurological: Negative for dizziness, weakness, light-headedness and headaches.     Physical Exam Triage Vital Signs ED Triage Vitals  Enc Vitals Group     BP 12/29/18 1914 (!) 148/80     Pulse Rate 12/29/18 1914 84     Resp 12/29/18 1914 18     Temp 12/29/18 1914 98.9 F (37.2 C)     Temp Source 12/29/18 1914 Oral     SpO2 12/29/18 1914 100 %     Weight --      Height --      Head Circumference --      Peak Flow --      Pain Score 12/29/18 1916 7     Pain Loc --      Pain Edu? --      Excl. in [place]? --    No data found.  Updated Vital Signs BP (!) 148/80 (BP Location: Left Arm)   Pulse 84   Temp 98.9 F (37.2 C) (Oral)   Resp 18   LMP 12/05/2012   SpO2 100%   Visual Acuity Right Eye Distance:   Left Eye Distance:   Bilateral Distance:    Right Eye Near:   Left Eye Near:    Bilateral Near:     Physical Exam Vitals signs and nursing note reviewed.  Constitutional:      Appearance: She is well-developed.     Comments: No acute distress  HENT:     Head: Normocephalic and atraumatic.     Nose: Nose normal.  Eyes:     Conjunctiva/sclera: Conjunctivae normal.  Neck:     Musculoskeletal: Neck supple.  Cardiovascular:     Rate and Rhythm: Normal rate.  Pulmonary:     Effort: Pulmonary effort is normal. No respiratory distress.  Abdominal:     General: There is no distension.  Musculoskeletal: Normal range of motion.      Comments: Patient able to bend at DIP and PIP of right index finger but limited due to pain and swelling  Skin:    General:  Skin is warm and dry.     Comments: Right index finger with erythema swelling and whitish purplish discoloration around nail fold, infection and tenderness does not extend to distal pulp of finger  Neurological:     Mental Status: She is alert and oriented to person, place, and time.      UC Treatments / Results  Labs (all labs ordered are listed, but only abnormal results are displayed) Labs Reviewed - No data to display  EKG None  Radiology No results found.  Procedures Incision and Drainage Date/Time: 12/29/2018 8:00 PM Performed by: Wieters, [place] C, PA-C Authorized by: Wieters, Elesa Hacker, PA-C   Consent:    Consent obtained:  Verbal   Consent given by:  Patient   Risks discussed:  Bleeding, incomplete drainage and pain   Alternatives discussed:  No treatment Location:    Type:  Abscess   Size:  2   Location:  Upper extremity   Upper extremity location:  Finger   Finger location:  R long finger Pre-procedure details:    Procedure prep: Alcohol prep pad. Anesthesia (see MAR for exact dosages):    Anesthesia method:  None Procedure type:    Complexity:  Simple Procedure details:    Incision types:  Stab incision   Incision depth:  Dermal   Scalpel blade:  11   Drainage:  Purulent and bloody   Drainage amount:  Moderate   Wound treatment:  Wound left open   Packing materials:  None Post-procedure details:    Patient tolerance of procedure:  Tolerated well, no immediate complications Comments:     Mild pain improvement relieved with procedure   (including critical care time)  Medications Ordered in UC Medications - No data to display  Initial Impression / Assessment and Plan / UC Course  I have reviewed the triage vital signs and the nursing notes.  Pertinent labs & imaging results that were available during my care of the patient  were reviewed by me and considered in my medical decision making (see chart for details).    Paronychia drained in clinic, will have patient continue Keflex, will add in Bactrim.  Tylenol and ibuprofen for pain.  Continue to monitor swelling redness and pain, return if not resolving with continued treatment and adding in Bactrim.  Continue soaks and Bactroban.Discussed strict return precautions. Patient verbalized understanding and is agreeable with plan.   Final Clinical Impressions(s) / UC Diagnoses   Final diagnoses:  Paronychia of finger of right hand     Discharge Instructions     Continue keflex, warm soaks with massage for extra drainage, bactroban twice daily after soaking and drying well Begin bactrim twice daily  IF having worsening redness, swelling or pain please follow up    ED Prescriptions    Medication Sig Dispense Auth. Provider   sulfamethoxazole-trimethoprim (BACTRIM DS,SEPTRA DS) 800-160 MG tablet  (Status: Discontinued) Take 1 tablet by mouth 2 (two) times daily for 7 days. 14 tablet Wieters, Hallie C, PA-C   sulfamethoxazole-trimethoprim (BACTRIM DS,SEPTRA DS) 800-160 MG tablet Take 1 tablet by mouth 2 (two) times daily for 7 days. 14 tablet Wieters, [place] C, PA-C     Controlled Substance Prescriptions [place] Controlled Substance Registry consulted? Not Applicable   Janith Lima, [place] 12/30/18 1018

## 2019-02-11 ENCOUNTER — Encounter: Payer: BLUE CROSS/BLUE SHIELD | Admitting: Family Medicine

## 2019-03-05 ENCOUNTER — Telehealth: Payer: Self-pay | Admitting: *Deleted

## 2019-03-05 NOTE — Telephone Encounter (Signed)
Copied from [place] [id number]. Topic: Appointment Scheduling - Scheduling Inquiry for Clinic >> Mar 04, 2019  5:38 PM Rutherford Nail, [place] wrote: Reason for CRM: Patient calling and would like to know how her CPE appointment on 03/07/2019 will go? States that since it is a physical, she needs blood work and if she cannot get blood work she does not want the appointment. Please advise.

## 2019-03-05 NOTE — Telephone Encounter (Signed)
This would not be a physical, but could discuss any concerns and if needs labs could order and she then could come for lab visit if urgently needed. Otherwise, would advise postponing if no concerns to decrease her risk of exposure. Thank you.

## 2019-03-05 NOTE — Telephone Encounter (Signed)
I called the pt and informed her of the message below.  Patient stated she does not have any concerns at this time, requested the appt be cancelled and stated she will call back for an appt.

## 2019-03-07 ENCOUNTER — Encounter: Payer: BLUE CROSS/BLUE SHIELD | Admitting: Family Medicine

## 2019-03-21 ENCOUNTER — Encounter: Payer: BLUE CROSS/BLUE SHIELD | Admitting: Adult Health

## 2019-03-27 ENCOUNTER — Other Ambulatory Visit: Payer: Self-pay

## 2019-03-27 ENCOUNTER — Ambulatory Visit: Payer: Self-pay

## 2019-03-27 ENCOUNTER — Encounter: Payer: Self-pay | Admitting: Internal Medicine

## 2019-03-27 ENCOUNTER — Ambulatory Visit (INDEPENDENT_AMBULATORY_CARE_PROVIDER_SITE_OTHER): Payer: BLUE CROSS/BLUE SHIELD | Admitting: Internal Medicine

## 2019-03-27 DIAGNOSIS — E042 Nontoxic multinodular goiter: Secondary | ICD-10-CM | POA: Diagnosis not present

## 2019-03-27 DIAGNOSIS — R6889 Other general symptoms and signs: Secondary | ICD-10-CM | POA: Diagnosis not present

## 2019-03-27 MED ORDER — FAMOTIDINE 20 MG PO TABS: 20.0000 mg | ORAL_TABLET | Freq: Two times a day (BID) | ORAL | 0 refills | Status: DC

## 2019-03-27 NOTE — Telephone Encounter (Signed)
For 2 weeks pt c/o a "fullness" to the back of her throat. Denies difficulty breathing or SOB and fever. Pt also has a cough that started after the fullness began. Cough is nonproductive.  Pt has h/o mitral valve prolapse.  Pt stated she has thyroid nodules.  Care advice given and pt verbalized understanding.  Pt verified e-mail address and pt informed that visit will be virtual. Pt warm transferred to [facility] for scheduling.     Reason for Disposition . [1] Patient also has allergy symptoms (e.g., itchy eyes, clear nasal discharge, postnasal drip) AND [2] they are acting up  Answer Assessment - Initial Assessment Questions 1. ONSET: "When did the cough begin?"  After the fullness began she started a mild cough     2. SEVERITY: "How bad is the cough today?"      Sporadic  3. RESPIRATORY DISTRESS: "Describe your breathing."     When she takes a deep breath she feels the sensation or fullness- comes off and on been 2 weeks 4. FEVER: "Do you have a fever?" If so, ask: "What is your temperature, how was it measured, and when did it start?"     no 5. HEMOPTYSIS: "Are you coughing up any blood?" If so ask: "How much?" (flecks, streaks, tablespoons, etc.)    no 6. TREATMENT: "What have you done so far to treat the cough?" (e.g., meds, fluids, humidifier)     nothing 7. CARDIAC HISTORY: "Do you have any history of heart disease?" (e.g., heart attack, congestive heart failure)       Mitral valve prolaspe 8. LUNG HISTORY: "Do you have any history of lung disease?"  (e.g., pulmonary embolus, asthma, emphysema)     no 9. PE RISK FACTORS: "Do you have a history of blood clots?" (or: recent major surgery, recent prolonged travel, bedridden)     no 10. OTHER SYMPTOMS: "Do you have any other symptoms? (e.g., runny nose, wheezing, chest pain)       "weird sensation to the back of her throat pt could not describe it  "fullness"- runny nose 11. PREGNANCY: "Is there any chance you are pregnant?" "When was  your last menstrual period?"       n/a 12. TRAVEL: "Have you traveled out of the country in the last month?" (e.g., travel history, exposures)       no  Protocols used: COUGH - ACUTE NON-PRODUCTIVE-A-AH

## 2019-03-27 NOTE — Progress Notes (Signed)
Virtual Visit via Video Note  I connected with@ on 03/27/19 at  3:30 PM EDT by a video enabled telemedicine application and verified that I am speaking with the correct person using two identifiers. Location patient: home Location provider:work  office Persons participating in the virtual visit: patient, provider  [place] national recommendations  regarding COVID 19 pandemic   video visit is advised over in office visit for this patient.   limitations of evaluation and management by telemedicine and  Decrease availability of in person appointments. The patient agreed to proceed.   HPI: Isabel [name] presents for video visit patient of Dr. Maudie Mercury.  For the last number of weeks she has had the feeling of something in her throat more on the left side but at the bottom of her throat and she points to the suprasternal sternal notch.  She denies true dysphasia or choking vomiting.  Does not remember acute onset.  She has an occasional cough that is not associated with systemic symptoms.  Does not think it is allergies. No specific heartburn. Past history of thyroid nodule in 2018 had a biopsy that was nondiagnostic does not feel an increasing size of her neck. She also felt something on her left neck throat when she turned her head.    ROS: See pertinent positives and negatives per HPI.  No systemic symptoms of chest pain shortness of breath fever chills nocturnal symptoms no interim treatment.  No change in appetite. Family history of a grandfather who had esophageal cancer in his 82s but otherwise negative.  Past Medical History:  Diagnosis Date  . 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 Breast cancer ([place])    sees onc and gen surg   . Mitral regurgitation    mild on echo from 2009, no MVP per notes  . Osteoarthritis    w/ bilat hip resurfacing and possible metal reaction, sees ortho at [facility]  . Scoliosis   . Uterine fibroid   . Varicose veins    goes to [place] [facility], s/p laser and inj tx    Past Surgical History:  Procedure Laterality Date  . BREAST LUMPECTOMY  09/12   left breast  . GANGLION CYST EXCISION    . left hip resurfaced 2011    . right hip resurfaced 2008    . unterine fibroid  1998   removal    Family History  Problem Relation Age of Onset  . Paget's disease of bone Mother   . Stroke Mother   . Hypertension Father   . Hyperlipidemia Father   . CAD Father 35  . Breast cancer Maternal Aunt   . Colon cancer Neg Hx   . Stomach cancer Neg Hx   . Thyroid disease Neg Hx     Social History   Tobacco Use  . Smoking status: Never Smoker  . Smokeless tobacco: Never Used  Substance Use Topics  . Alcohol use: Yes    Alcohol/week: 7.0 standard drinks    Types: 7 Glasses of wine per week  . Drug use: No      Current Outpatient Medications:  .  Ascorbic Acid (VITA-C PO), Take by mouth., Disp: , Rfl:  .  BIOTIN 5000 PO, Take by mouth., Disp: , Rfl:  .  CALCIUM CITRATE PO, Take by mouth daily., Disp: , Rfl:  .  cephALEXin (KEFLEX) 500 MG capsule, Take 1 capsule (500 mg total) by mouth 2 (two) times daily., Disp: 10 capsule, Rfl: 0 .  Cholecalciferol (VITAMIN D3 PO), Take 2,000 Units by mouth 2 (two) times daily., Disp: , Rfl:  .  famotidine (PEPCID) 20 MG tablet, Take 1 tablet (20 mg total) by mouth 2 (two) times daily., Disp: 60 tablet, Rfl: 0 .  MAGNESIUM PO, Take by mouth daily., Disp: , Rfl:  .  Multiple Vitamin (MULTI-VITAMIN DAILY PO), Take by mouth., Disp: , Rfl:  .  mupirocin ointment (BACTROBAN) 2 %, Apply small amount twice daily to the affected area., Disp: 22 g, Rfl: 0 .  NON FORMULARY, Inflamed-herbal anti inflammatory, Disp: , Rfl:  .  NON FORMULARY, Hepa-trope-supplement for liver, Disp: , Rfl:  .  Omega-3 Fatty Acids (FISH OIL) 500 MG CAPS, Take 690 mg by mouth 2 (two) times daily. , Disp: , Rfl:  .  Probiotic Product (PROBIOTIC-10 PO), Take by mouth., Disp: , Rfl:  .  Vitamin D, Ergocalciferol, (DRISDOL)  50000 units CAPS capsule, Take 1 capsule (50,000 Units total) by mouth every 7 (seven) days., Disp: 12 capsule, Rfl: 0  EXAM: BP Readings from Last 3 Encounters:  12/29/18 (!) 148/80  12/27/18 100/76  07/05/18 96/70    VITALS per patient if applicable:  GENERAL: alert, oriented, appears well and in no acute distress looks well with normal speech no throat clearing or cough during the interview.  HEENT: atraumatic, conjunttiva clear, no obvious abnormalities on inspection of external nose and ears  NECK: normal movements of the head and neck no obvious masses seen on inspection points to the lower neck supra sternal area as area of concern  LUNGS: on inspection no signs of respiratory distress, breathing rate appears normal, no obvious gross SOB, gasping or wheezing  CV: no obvious cyanosis  MS: moves all visible extremities without noticeable abnormality  PSYCH/NEURO: pleasant and cooperative, no obvious depression or anxiety, speech and thought processing grossly intact Lab Results  Component Value Date   WBC 4.1 02/06/2018   HGB 14.4 02/06/2018   HCT 42.0 02/06/2018   PLT 226.0 02/06/2018   GLUCOSE 89 02/06/2018   CHOL 176 02/06/2018   TRIG 65.0 02/06/2018   HDL 88.20 02/06/2018   LDLCALC 75 02/06/2018   ALT 18 12/02/2015   AST 18 12/02/2015   NA 140 02/06/2018   K 4.3 02/06/2018   CL 103 02/06/2018   CREATININE 0.58 02/06/2018   BUN 16 02/06/2018   CO2 31 02/06/2018   TSH 1.03 02/06/2018   HGBA1C 5.7 02/06/2018    ASSESSMENT AND PLAN:  Discussed the following assessment and plan:  Throat symptom  Multinodular goiter - his of  indeterminant bx nodule Throat symptoms of unclear etiology although suspect it may be more esophageal in nature without alarm findings except that this is new. Less likely to be enlarging goiter however in the differential. At this time would treat for esophageal cause famotidine 20 mg twice daily for 2 weeks plus prescription sent  into [facility] 4 months worth in case. Get back with [place] on my chart or visit if needed in 2 to 3 weeks about progress.  If persistent progressive consider other evaluation.  At this time no alarm symptoms and close observation seems practical and prudent.  Counseled.   Expectant management and discussion of plan and treatment with patient with opportunity to ask questions and all were answered. The patient agreed with the plan and demonstrated an understanding of the instructions.   The patient was advised to call back or seek an in-person evaluation if worsening  or having concerns .  Shanon Ace, MD

## 2019-05-22 NOTE — Progress Notes (Signed)
Virtual Visit via Video Note  I connected with Isabel [name]   on 05/24/19 at  8:30 AM EDT by a video enabled telemedicine application and verified that I am speaking with the correct person using two identifiers.  Location patient: home Location provider:work office Persons participating in the virtual visit: patient, provider  I discussed the limitations of evaluation and management by telemedicine and the availability of in person appointments. The patient expressed understanding and agreed to proceed.   Isabel [name] DOB: May 25, 1956 Encounter date: 05/24/2019  This is a 63 y.o. female who presents to establish care. Chief Complaint  Patient presents with  . Establish Care   Last visit virtual with [facility] 03/27/19 for throat sx - pepcid recommended. Per patient sx did get better. She is no longer taking pepcid.   History of present illness: Hx breast cancer (stage o); does follow with oncology.   Arthritis: does have a lot of issues with this. On feet all day on concrete. Had bilateral hip resurfacing, and so has some issues with hip/loss of "cushion"; feet hurt - bunions. On feet for 7 hours/day at least. Feels like this makes it harder on her joints. Not following with anyone on regular basis. Does take herbal anti-inflammatory from integrative medicine doc which helps with overall joint discomfort. Hands also bother her. Hx of scoliosis. Has added "wobenzyme-ps". 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 Has seen chiropractors in past; not seeing one now.   Still has varicosities; bother her with long periods of standing. Thinking of going back to vein specialists.    Would like to get bloodwork done; will be able to bring in previous integrative order/labs at next visit.   Follows with gyn yearly. Hasn't this year due to COVID.    Past Medical History:  Diagnosis Date  . 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 Breast cancer ([place])    sees  onc and gen surg   . Mitral regurgitation    mild on echo from 2009, no MVP per notes  . Osteoarthritis    w/ bilat hip resurfacing and possible metal reaction, sees ortho at [facility]  . Scoliosis   . Uterine fibroid   . Varicose veins    goes to [place] [facility], s/p laser and inj tx   Past Surgical History:  Procedure Laterality Date  . BREAST LUMPECTOMY  09/12   left breast  . GANGLION CYST EXCISION    . left hip resurfaced 2011    . right hip resurfaced 2008    . unterine fibroid  1998   removal   Allergies  Allergen Reactions  . Erythromycin Diarrhea and Nausea Only  . Latex     Sores in mouth   Current Meds  Medication Sig  . Ascorbic Acid (VITA-C PO) Take by mouth.  Marland Kitchen BIOTIN 5000 PO Take by mouth 2 (two) times a day.   Marland Kitchen CALCIUM CITRATE PO Take by mouth daily.  . Cholecalciferol (VITAMIN D3 PO) Take 2,000 Units by mouth 2 (two) times daily.  Marland Kitchen MAGNESIUM PO Take by mouth daily.  . Multiple Vitamin (MULTI-VITAMIN DAILY PO) Take by mouth.  . NON FORMULARY Inflamed-herbal anti inflammatory  . NON FORMULARY supplement for liver  . Omega-3 Fatty Acids (FISH OIL) 500 MG CAPS Take 690 mg by mouth 2 (two) times daily.   Marland Kitchen OVER THE COUNTER MEDICATION wob enzym  . OVER THE COUNTER MEDICATION Digestive enzyme  . Probiotic Product (  PROBIOTIC-10 PO) Take by mouth.  . [DISCONTINUED] cephALEXin (KEFLEX) 500 MG capsule Take 1 capsule (500 mg total) by mouth 2 (two) times daily.  . [DISCONTINUED] famotidine (PEPCID) 20 MG tablet Take 1 tablet (20 mg total) by mouth 2 (two) times daily.  . [DISCONTINUED] mupirocin ointment (BACTROBAN) 2 % Apply small amount twice daily to the affected area.  . [DISCONTINUED] Vitamin D, Ergocalciferol, (DRISDOL) 50000 units CAPS capsule Take 1 capsule (50,000 Units total) by mouth every 7 (seven) days.   Social History   Tobacco Use  . Smoking status: Never Smoker  . Smokeless tobacco: Never Used  Substance Use Topics  . Alcohol use: Yes     Alcohol/week: 7.0 standard drinks    Types: 7 Glasses of wine per week   Family History  Problem Relation Age of Onset  . Stroke Mother 16       hemorrhagic  . Other Mother        pagets disease breast  . Arthritis Mother        foot, knee problems  . Hypertension Father   . Hyperlipidemia Father   . CAD Father 23       Triple bypass; died age 8  . Arthritis Father   . Dementia Father   . Breast cancer Maternal Aunt   . Esophageal cancer Maternal Grandfather 90  . Dementia Paternal Grandmother   . Heart disease Paternal Grandfather 39       multiple heart attacks  . Arthritis Paternal Uncle   . Colon cancer Neg Hx   . Stomach cancer Neg Hx   . Thyroid disease Neg Hx      Review of Systems  Constitutional: Negative for chills, fatigue and fever.  Respiratory: Negative for cough, chest tightness, shortness of breath and wheezing.   Cardiovascular: Negative for chest pain, palpitations and leg swelling.    Objective:  LMP 12/05/2012       BP Readings from Last 3 Encounters:  12/29/18 (!) 148/80  12/27/18 100/76  07/05/18 96/70   Wt Readings from Last 3 Encounters:  12/27/18 143 lb 1.6 oz (64.9 kg)  07/05/18 145 lb (65.8 kg)  06/05/18 145 lb (65.8 kg)    EXAM:  GENERAL: alert, oriented, appears well and in no acute distress  HEENT: atraumatic, conjunctiva clear, no obvious abnormalities on inspection of external nose and ears  NECK: normal movements of the head and neck  LUNGS: on inspection no signs of respiratory distress, breathing rate appears normal, no obvious gross SOB, gasping or wheezing  CV: no obvious cyanosis  MS: moves all visible extremities without noticeable abnormality  PSYCH/NEURO: pleasant and cooperative, no obvious depression or anxiety, speech and thought processing grossly intact  SKIN: no facial/neck abnormalities  Assessment/Plan 1. Osteoarthritis of multiple joints, unspecified osteoarthritis type Currently managing with  supplements, supportive shoes. Considering chiropractor and orthotics to help with support/muscle balance.  2. Malignant neoplasm of upper-outer quadrant of left breast in female, estrogen receptor positive ([place]) Follows with oncology/gyn.  3. Varicose veins of lower extremity, unspecified laterality, unspecified whether complicated Considering following back up with vascular.   4. Multinodular goiter Didn't follows up for repeat biopsy due to cost/frustration with lack of results. Will recheck to see stability from last [place] 05/2017. - US Soft Tissue Head/Neck; Future   Return in about 2 months (around 07/24/2019) for physical exam.  I discussed the assessment and treatment plan with the patient. The patient was provided an opportunity to ask questions and all  were answered. The patient agreed with the plan and demonstrated an understanding of the instructions.   The patient was advised to call back or seek an in-person evaluation if the symptoms worsen or if the condition fails to improve as anticipated.  I provided 30 minutes of non-face-to-face time during this encounter.   Micheline Rough, MD

## 2019-05-24 ENCOUNTER — Other Ambulatory Visit: Payer: Self-pay

## 2019-05-24 ENCOUNTER — Encounter: Payer: Self-pay | Admitting: Family Medicine

## 2019-05-24 ENCOUNTER — Ambulatory Visit (INDEPENDENT_AMBULATORY_CARE_PROVIDER_SITE_OTHER): Payer: BC Managed Care – PPO | Admitting: Family Medicine

## 2019-05-24 DIAGNOSIS — C50412 Malignant neoplasm of upper-outer quadrant of left female breast: Secondary | ICD-10-CM | POA: Diagnosis not present

## 2019-05-24 DIAGNOSIS — E042 Nontoxic multinodular goiter: Secondary | ICD-10-CM

## 2019-05-24 DIAGNOSIS — Z170 Estrogen receptor positive status [ER+]: Secondary | ICD-10-CM

## 2019-05-24 DIAGNOSIS — I8390 Asymptomatic varicose veins of unspecified lower extremity: Secondary | ICD-10-CM

## 2019-05-24 DIAGNOSIS — M159 Polyosteoarthritis, unspecified: Secondary | ICD-10-CM

## 2019-05-31 ENCOUNTER — Encounter: Payer: Self-pay | Admitting: Internal Medicine

## 2019-05-31 ENCOUNTER — Telehealth: Payer: Self-pay | Admitting: Family Medicine

## 2019-05-31 ENCOUNTER — Other Ambulatory Visit: Payer: Self-pay

## 2019-05-31 ENCOUNTER — Ambulatory Visit (INDEPENDENT_AMBULATORY_CARE_PROVIDER_SITE_OTHER): Payer: BC Managed Care – PPO | Admitting: Internal Medicine

## 2019-05-31 VITALS — BP 110/68 | HR 99 | Temp 99.0°F | Wt 143.0 lb

## 2019-05-31 DIAGNOSIS — G5762 Lesion of plantar nerve, left lower limb: Secondary | ICD-10-CM

## 2019-05-31 DIAGNOSIS — M21612 Bunion of left foot: Secondary | ICD-10-CM | POA: Diagnosis not present

## 2019-05-31 DIAGNOSIS — M21611 Bunion of right foot: Secondary | ICD-10-CM

## 2019-05-31 MED ORDER — DOXYCYCLINE HYCLATE 100 MG PO TABS: 100.0000 mg | ORAL_TABLET | Freq: Two times a day (BID) | ORAL | 0 refills | Status: AC

## 2019-05-31 MED FILL — DOXYCYCLINE HYCLATE 100 MG: 100 | 7 days supply | Qty: 14 | Fill #0

## 2019-05-31 NOTE — Patient Instructions (Signed)
-Hope you feel better soon!  -Ibuprofen 2 tabs 3 times a day for 5 days.  -Doxycycline 1 tablet twice daily for 7 days.   Morton Neuralgia  Morton neuralgia is foot pain that affects the ball of the foot and the area near the toes. Morton neuralgia occurs when part of a nerve in the foot (digital nerve) is under too much pressure (compressed). When this happens over a long period of time, the nerve can thicken (neuroma) and cause pain. Pain usually occurs between the third and fourth toes.  Morton neuralgia can come and go but may get worse over time. What are the causes? This condition is caused by doing the same things over and over with your foot, such as:  Activities such as running or jumping.  Wearing shoes that are too tight. What increases the risk? You may be at higher risk for Morton neuralgia if you:  Are female.  Wear high heels.  Wear shoes that are narrow or tight.  Do activities that repeatedly stretch your toes, such as: ? Running. ? [place]. ? Long-distance walking. What are the signs or symptoms? The first symptom of Morton neuralgia is pain that spreads from the ball of the foot to the toes. It may feel like you are walking on a marble. Pain usually gets worse with walking and goes away at night. Other symptoms may include numbness and cramping of your toes. Both feet are equally affected, but rarely at the same time. How is this diagnosed? This condition is diagnosed based on your symptoms, your medical history, and a physical exam. Your health care provider may:  Squeeze your foot just behind your toe.  Ask you to move your toes to check for pain.  Ask about your physical activity level. You also may have imaging tests, such as an X-ray, ultrasound, or MRI. How is this treated? Treatment depends on how severe your condition is and what causes it. Treatment may involve:  Wearing different shoes that are not too tight, are low-heeled, and provide good  support. For some people, this is the only treatment needed.  Wearing an over-the-counter or custom supportive pad (orthotic) under the front of your foot.  Getting injections of numbing medicine and anti-inflammatory medicine (steroid) in the nerve.  Having surgery to remove part of the thickened nerve. Follow these instructions at home: Managing pain, stiffness, and swelling   Massage your foot as needed.  Wear orthotics as told by your health care provider.  If directed, put ice on your foot: ? Put ice in a plastic bag. ? Place a towel between your skin and the bag. ? Leave the ice on for 20 minutes, 2-3 times a day.  Avoid activities that cause pain or make pain worse. If you play sports, ask your health care provider when it is safe for you to return to sports.  Raise (elevate) your foot above the level of your heart while lying down and, when possible, while sitting. General instructions  Take over-the-counter and prescription medicines only as told by your health care provider.  Do not drive or use heavy machinery while taking prescription pain medicine.  Wear shoes that: ? Have soft soles. ? Have a wide toe area. ? Provide arch support. ? Do not pinch or squeeze your feet. ? Have room for your orthotics, if applicable.  Keep all follow-up visits as told by your health care provider. This is important. Contact a health care provider if:  Your symptoms get  worse or do not get better with treatment and home care. Summary  Morton neuralgia is foot pain that affects the ball of the foot and the area near the toes. Pain usually occurs between the third and fourth toes, gets worse with walking, and goes away at night.  Morton neuralgia occurs when part of a nerve in the foot (digital nerve) is under too much pressure. When this happens over a long period of time, the nerve can thicken (neuroma) and cause pain.  This condition is caused by doing the same things over and  over with your foot, such as running or jumping, wearing shoes that are too tight, or wearing high heels.  Treatment may involve wearing low-heeled shoes that are not too tight, wearing a supportive pad (orthotic) under the front of your foot, getting injections in the nerve, or having surgery to remove part of the thickened nerve. This information is not intended to replace advice given to you by your health care provider. Make sure you discuss any questions you have with your health care provider. Document Released: 02/27/2001 Document Revised: 12/05/2017 Document Reviewed: 12/05/2017 Elsevier Patient Education  [place] is when the fluid-filled sac (bursa) that covers and protects a joint is swollen (inflamed). Bursitis is most common near joints such as the knees, elbows, hips, and shoulders. It can cause pain and stiffness. Follow these instructions at home: Medicines  Take over-the-counter and prescription medicines only as told by your doctor.  If you were prescribed an antibiotic medicine, take it as told by your doctor. Do not stop taking it even if you start to feel better. General instructions   Rest the affected area as told by your doctor. ? If you can, raise (elevate) the affected area above the level of your heart while you are sitting or lying down. ? Avoid doing things that make the pain worse.  Use a splint, brace, pad, or walking aid as told by your doctor.  If directed, put ice on the affected area: ? If you have a removable splint or brace, take it off as told by your doctor. ? Put ice in a plastic bag. ? Place a towel between your skin and the bag, or between the splint or brace and the bag. ? Leave the ice on for 20 minutes, 2-3 times a day.  Keep all follow-up visits as told by your doctor. This is important. Preventing symptoms Do these things to help you not have symptoms again:  Wear knee pads if you kneel often.  Wear  sturdy running or walking shoes that fit you well.  Take a lot of breaks during activities that involve doing the same movements again and again.  Before you do any activity that takes a lot of effort, get your body ready by stretching.  Stay at a healthy weight or lose weight if your doctor says you should. If you need help doing this, ask your doctor.  Exercise often. If you start any new physical activity, do it slowly. Contact a doctor if you:  Have a fever.  Have chills.  Have symptoms that do not get better with treatment or home care. Summary  Bursitis is when the fluid-filled sac (bursa) that covers and protects a joint is swollen.  Rest the affected area as told by your doctor.  Avoid doing things that make the pain worse.  Put ice on the affected area as told by your doctor. This information is  not intended to replace advice given to you by your health care provider. Make sure you discuss any questions you have with your health care provider. Document Released: 05/11/2010 Document Revised: 10/06/2017 Document Reviewed: 10/06/2017 Elsevier Interactive Patient Education  2019 [employer].

## 2019-05-31 NOTE — Progress Notes (Signed)
Acute Office Visit     CC/Reason for Visit: left foot pain  HPI: Isabel [name] is a 63 y.o. female who is coming in today for the above mentioned reasons. She has bunions of both feet. Has noticed the bunion on the left foot has gotten really painful the last few days and has become fluid-filled. It is really red. No new shoes. She also has pain and numbness in between third and fourth toes of her left foot.   Past Medical/Surgical History: Past Medical History:  Diagnosis Date  . 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 Breast cancer ([place])    sees onc and gen surg   . Mitral regurgitation    mild on echo from 2009, no MVP per notes  . Osteoarthritis    w/ bilat hip resurfacing and possible metal reaction, sees ortho at [facility]  . Scoliosis   . Uterine fibroid   . Varicose veins    goes to [place] [facility], s/p laser and inj tx    Past Surgical History:  Procedure Laterality Date  . BREAST LUMPECTOMY  09/12   left breast  . GANGLION CYST EXCISION    . left hip resurfaced 2011    . right hip resurfaced 2008    . unterine fibroid  1998   removal    Social History:  reports that she has never smoked. She has never used smokeless tobacco. She reports current alcohol use of about 7.0 standard drinks of alcohol per week. She reports that she does not use drugs.  Allergies: Allergies  Allergen Reactions  . Erythromycin Diarrhea and Nausea Only  . Latex     Sores in mouth    Family History:  Family History  Problem Relation Age of Onset  . Stroke Mother 51       hemorrhagic  . Other Mother        pagets disease breast  . Arthritis Mother        foot, knee problems  . Hypertension Father   . Hyperlipidemia Father   . CAD Father 34       Triple bypass; died age 33  . Arthritis Father   . Dementia Father   . Breast cancer Maternal Aunt   . Esophageal cancer Maternal Grandfather 90  . Dementia Paternal Grandmother   . Heart disease  Paternal Grandfather 32       multiple heart attacks  . Arthritis Paternal Uncle   . Colon cancer Neg Hx   . Stomach cancer Neg Hx   . Thyroid disease Neg Hx      Current Outpatient Medications:  .  Ascorbic Acid (VITA-C PO), Take by mouth., Disp: , Rfl:  .  BIOTIN 5000 PO, Take by mouth 2 (two) times a day. , Disp: , Rfl:  .  CALCIUM CITRATE PO, Take by mouth daily., Disp: , Rfl:  .  Cholecalciferol (VITAMIN D3 PO), Take 2,000 Units by mouth 2 (two) times daily., Disp: , Rfl:  .  MAGNESIUM PO, Take by mouth daily., Disp: , Rfl:  .  Multiple Vitamin (MULTI-VITAMIN DAILY PO), Take by mouth., Disp: , Rfl:  .  NON FORMULARY, Inflamed-herbal anti inflammatory, Disp: , Rfl:  .  NON FORMULARY, supplement for liver, Disp: , Rfl:  .  Omega-3 Fatty Acids (FISH OIL) 500 MG CAPS, Take 690 mg by mouth 2 (two) times daily. , Disp: , Rfl:  .  OVER THE COUNTER MEDICATION, wob enzym,  Disp: , Rfl:  .  OVER THE COUNTER MEDICATION, Digestive enzyme, Disp: , Rfl:  .  Probiotic Product (PROBIOTIC-10 PO), Take by mouth., Disp: , Rfl:  .  doxycycline (VIBRA-TABS) 100 MG tablet, Take 1 tablet (100 mg total) by mouth 2 (two) times daily for 7 days., Disp: 14 tablet, Rfl: 0 .  tamoxifen (NOLVADEX) 20 MG tablet, Take by mouth., Disp: , Rfl:   Review of Systems:  Constitutional: Denies fever, chills, diaphoresis, appetite change and fatigue.  HEENT: Denies photophobia, eye pain, redness, hearing loss, ear pain, congestion, sore throat, rhinorrhea, sneezing, mouth sores, trouble swallowing, neck pain, neck stiffness and tinnitus.   Respiratory: Denies SOB, DOE, cough, chest tightness,  and wheezing.   Cardiovascular: Denies chest pain, palpitations and leg swelling.  Gastrointestinal: Denies nausea, vomiting, abdominal pain, diarrhea, constipation, blood in stool and abdominal distention.  Genitourinary: Denies dysuria, urgency, frequency, hematuria, flank pain and difficulty urinating.  Endocrine: Denies: hot or  cold intolerance, sweats, changes in hair or nails, polyuria, polydipsia. Musculoskeletal: Denies myalgias, back pain, joint swelling, arthralgias and gait problem.  Skin: Denies pallor, rash and wound.  Neurological: Denies dizziness, seizures, syncope, weakness, light-headedness, numbness and headaches.  Hematological: Denies adenopathy. Easy bruising, personal or family bleeding history  Psychiatric/Behavioral: Denies suicidal ideation, mood changes, confusion, nervousness, sleep disturbance and agitation    Physical Exam: Vitals:   05/31/19 1422  BP: 110/68  Pulse: 99  Temp: 99 F (37.2 C)  TempSrc: Oral  SpO2: 98%  Weight: 143 lb (64.9 kg)    Body mass index is 22.4 kg/m.   Constitutional: NAD, calm, comfortable Eyes: PERRL, lids and conjunctivae normal ENMT: Mucous membranes are moist .Musculoskeletal: no clubbing / cyanosis. She has bilateral bunions. On the left it is swollen, fluid filled and erythematous. Pain to palpation also in between left 3rd a 4th toes. Skin: no rashes, lesions, ulcers. No induration Neurologic: grossly intact and non focal. Psychiatric: Normal judgment and insight. Alert and oriented x 3. Normal mood.    Impression and Plan:  Morton's neuroma of left foot  Bilateral bunions  -There appears to be some inflammation/infection of the left 1st MTP joint with a fluid-filled collection. Not typical gout appearance. ? Bursitis. -Patient advised only way to fully rule out gout is with a joint aspiration. -Doxy for 7 days, ibuprofen icing. -Will refer to sports medicine for both the bunions and the presumed morton's neuroma for custom fitted shoes and further recommendations.    Patient Instructions  -Hope you feel better soon!  -Ibuprofen 2 tabs 3 times a day for 5 days.  -Doxycycline 1 tablet twice daily for 7 days.   Morton Neuralgia  Morton neuralgia is foot pain that affects the ball of the foot and the area near the toes. Morton  neuralgia occurs when part of a nerve in the foot (digital nerve) is under too much pressure (compressed). When this happens over a long period of time, the nerve can thicken (neuroma) and cause pain. Pain usually occurs between the third and fourth toes.  Morton neuralgia can come and go but may get worse over time. What are the causes? This condition is caused by doing the same things over and over with your foot, such as:  Activities such as running or jumping.  Wearing shoes that are too tight. What increases the risk? You may be at higher risk for Morton neuralgia if you:  Are female.  Wear high heels.  Wear shoes that are narrow or tight.  Do activities that repeatedly stretch your toes, such as: ? Running. ? [place]. ? Long-distance walking. What are the signs or symptoms? The first symptom of Morton neuralgia is pain that spreads from the ball of the foot to the toes. It may feel like you are walking on a marble. Pain usually gets worse with walking and goes away at night. Other symptoms may include numbness and cramping of your toes. Both feet are equally affected, but rarely at the same time. How is this diagnosed? This condition is diagnosed based on your symptoms, your medical history, and a physical exam. Your health care provider may:  Squeeze your foot just behind your toe.  Ask you to move your toes to check for pain.  Ask about your physical activity level. You also may have imaging tests, such as an X-ray, ultrasound, or MRI. How is this treated? Treatment depends on how severe your condition is and what causes it. Treatment may involve:  Wearing different shoes that are not too tight, are low-heeled, and provide good support. For some people, this is the only treatment needed.  Wearing an over-the-counter or custom supportive pad (orthotic) under the front of your foot.  Getting injections of numbing medicine and anti-inflammatory medicine (steroid) in the  nerve.  Having surgery to remove part of the thickened nerve. Follow these instructions at home: Managing pain, stiffness, and swelling   Massage your foot as needed.  Wear orthotics as told by your health care provider.  If directed, put ice on your foot: ? Put ice in a plastic bag. ? Place a towel between your skin and the bag. ? Leave the ice on for 20 minutes, 2-3 times a day.  Avoid activities that cause pain or make pain worse. If you play sports, ask your health care provider when it is safe for you to return to sports.  Raise (elevate) your foot above the level of your heart while lying down and, when possible, while sitting. General instructions  Take over-the-counter and prescription medicines only as told by your health care provider.  Do not drive or use heavy machinery while taking prescription pain medicine.  Wear shoes that: ? Have soft soles. ? Have a wide toe area. ? Provide arch support. ? Do not pinch or squeeze your feet. ? Have room for your orthotics, if applicable.  Keep all follow-up visits as told by your health care provider. This is important. Contact a health care provider if:  Your symptoms get worse or do not get better with treatment and home care. Summary  Morton neuralgia is foot pain that affects the ball of the foot and the area near the toes. Pain usually occurs between the third and fourth toes, gets worse with walking, and goes away at night.  Morton neuralgia occurs when part of a nerve in the foot (digital nerve) is under too much pressure. When this happens over a long period of time, the nerve can thicken (neuroma) and cause pain.  This condition is caused by doing the same things over and over with your foot, such as running or jumping, wearing shoes that are too tight, or wearing high heels.  Treatment may involve wearing low-heeled shoes that are not too tight, wearing a supportive pad (orthotic) under the front of your foot,  getting injections in the nerve, or having surgery to remove part of the thickened nerve. This information is not intended to replace advice given to you by your health care provider. Make  sure you discuss any questions you have with your health care provider. Document Released: 02/27/2001 Document Revised: 12/05/2017 Document Reviewed: 12/05/2017 Elsevier Patient Education  [place] is when the fluid-filled sac (bursa) that covers and protects a joint is swollen (inflamed). Bursitis is most common near joints such as the knees, elbows, hips, and shoulders. It can cause pain and stiffness. Follow these instructions at home: Medicines  Take over-the-counter and prescription medicines only as told by your doctor.  If you were prescribed an antibiotic medicine, take it as told by your doctor. Do not stop taking it even if you start to feel better. General instructions   Rest the affected area as told by your doctor. ? If you can, raise (elevate) the affected area above the level of your heart while you are sitting or lying down. ? Avoid doing things that make the pain worse.  Use a splint, brace, pad, or walking aid as told by your doctor.  If directed, put ice on the affected area: ? If you have a removable splint or brace, take it off as told by your doctor. ? Put ice in a plastic bag. ? Place a towel between your skin and the bag, or between the splint or brace and the bag. ? Leave the ice on for 20 minutes, 2-3 times a day.  Keep all follow-up visits as told by your doctor. This is important. Preventing symptoms Do these things to help you not have symptoms again:  Wear knee pads if you kneel often.  Wear sturdy running or walking shoes that fit you well.  Take a lot of breaks during activities that involve doing the same movements again and again.  Before you do any activity that takes a lot of effort, get your body ready by stretching.   Stay at a healthy weight or lose weight if your doctor says you should. If you need help doing this, ask your doctor.  Exercise often. If you start any new physical activity, do it slowly. Contact a doctor if you:  Have a fever.  Have chills.  Have symptoms that do not get better with treatment or home care. Summary  Bursitis is when the fluid-filled sac (bursa) that covers and protects a joint is swollen.  Rest the affected area as told by your doctor.  Avoid doing things that make the pain worse.  Put ice on the affected area as told by your doctor. This information is not intended to replace advice given to you by your health care provider. Make sure you discuss any questions you have with your health care provider. Document Released: 05/11/2010 Document Revised: 10/06/2017 Document Reviewed: 10/06/2017 Elsevier Interactive Patient Education  2019 [place], MD [facility]

## 2019-05-31 NOTE — Telephone Encounter (Signed)
Patient called and said that she is having foot pain due to a knot on her foot and would lilke to talk to a CMA about this to see what she needs to do. She has trouble wearing her shoes. Please call patient back, thanks.

## 2019-05-31 NOTE — Telephone Encounter (Signed)
I called the Isabel [name] and scheduled a visit today with Dr Jerilee Hoh today at 2:30pm.

## 2019-06-10 ENCOUNTER — Ambulatory Visit
Admission: RE | Admit: 2019-06-10 | Discharge: 2019-06-10 | Disposition: A | Discharge status: Home / Self Care | Payer: BC Managed Care – PPO | Source: Ambulatory Visit | Attending: Family Medicine | Admitting: Family Medicine

## 2019-06-10 ENCOUNTER — Other Ambulatory Visit: Payer: Self-pay

## 2019-06-10 DIAGNOSIS — E041 Nontoxic single thyroid nodule: Secondary | ICD-10-CM | POA: Diagnosis not present

## 2019-06-10 DIAGNOSIS — M71072 Abscess of bursa, left ankle and foot: Secondary | ICD-10-CM | POA: Diagnosis not present

## 2019-06-10 DIAGNOSIS — E042 Nontoxic multinodular goiter: Secondary | ICD-10-CM

## 2019-06-10 DIAGNOSIS — L089 Local infection of the skin and subcutaneous tissue, unspecified: Secondary | ICD-10-CM | POA: Diagnosis not present

## 2019-06-10 MED FILL — DOXYCYCLINE HYCLATE 100 MG: 100 | 7 days supply | Qty: 14 | Fill #0

## 2019-06-11 ENCOUNTER — Telehealth: Payer: Self-pay | Admitting: Family Medicine

## 2019-06-11 NOTE — Telephone Encounter (Unsigned)
Copied from [place] [id number]. Topic: Quick Communication - Lab Results (Clinic Use ONLY) >> Jun 11, 2019  2:56 PM Agnes Lawrence, CMA wrote: Called patient to inform them of lab results. When patient returns call, triage nurse may disclose results. >> Jun 11, 2019  6:02 PM Yvette Rack wrote: Pt returned call for lab results. Pt requests call back. Cb# [phone]

## 2019-06-13 NOTE — Telephone Encounter (Signed)
Spoke with pt about her thyroid US and pt understood result.   Before hanging up pt wanted to tell Dr. Ethlyn Gallery that she saw Dr. Jerilee Hoh on 05/31/19 for her left foot. She states she feels like there has not been any improvement and feels the pain has gotten slightly worse. Pt did mention she does have an upcoming appt on 07/17 with sports med but unsure if he will be able to help. Pt would like to know what to do.

## 2019-06-13 NOTE — Telephone Encounter (Signed)
Looking over Dr. Ledell Noss note it looks like all issues are with feet. I would consider podiatry referral instead of sports medicine? Unless it was patient preference to see sports med doctor. They should be able to help with pain. If pain is worse at this point let me know; we might need to re-examine in office. But usually podiatry is pretty quick about getting patients in and frequently able to use things like injections to take down inflammation and help with pain. Would recommend [facility] and if you can help facilitate quicker appointment if she is uncomfortable that would be great.

## 2019-06-13 NOTE — Telephone Encounter (Signed)
I left a message for the pt to return my call.  CRM also created. 

## 2019-06-21 ENCOUNTER — Other Ambulatory Visit: Payer: Self-pay

## 2019-06-21 ENCOUNTER — Ambulatory Visit: Payer: Self-pay

## 2019-06-21 ENCOUNTER — Ambulatory Visit (INDEPENDENT_AMBULATORY_CARE_PROVIDER_SITE_OTHER)
Admission: RE | Admit: 2019-06-21 | Discharge: 2019-06-21 | Disposition: A | Discharge status: Home / Self Care | Payer: BC Managed Care – PPO | Source: Ambulatory Visit | Attending: Family Medicine | Admitting: Family Medicine

## 2019-06-21 ENCOUNTER — Encounter: Payer: Self-pay | Admitting: Family Medicine

## 2019-06-21 ENCOUNTER — Ambulatory Visit (INDEPENDENT_AMBULATORY_CARE_PROVIDER_SITE_OTHER): Payer: BC Managed Care – PPO | Admitting: Family Medicine

## 2019-06-21 VITALS — BP 110/62 | HR 80 | Ht 67.0 in | Wt 140.0 lb

## 2019-06-21 DIAGNOSIS — M79672 Pain in left foot: Secondary | ICD-10-CM

## 2019-06-21 DIAGNOSIS — M659 Synovitis and tenosynovitis, unspecified: Secondary | ICD-10-CM

## 2019-06-21 DIAGNOSIS — M19079 Primary osteoarthritis, unspecified ankle and foot: Secondary | ICD-10-CM | POA: Diagnosis not present

## 2019-06-21 DIAGNOSIS — E042 Nontoxic multinodular goiter: Secondary | ICD-10-CM

## 2019-06-21 DIAGNOSIS — M7732 Calcaneal spur, left foot: Secondary | ICD-10-CM | POA: Diagnosis not present

## 2019-06-21 DIAGNOSIS — M19072 Primary osteoarthritis, left ankle and foot: Secondary | ICD-10-CM | POA: Diagnosis not present

## 2019-06-21 DIAGNOSIS — M7989 Other specified soft tissue disorders: Secondary | ICD-10-CM | POA: Diagnosis not present

## 2019-06-21 NOTE — Progress Notes (Signed)
[name] [address] [place], [place] [address] Phone: [phone] Subjective:   Isabel [name], am serving as a scribe for Dr. Hulan Saas.   CC: Left foot pain  WCB:[id number]  Isabel [name] is a 63 y.o. female coming in with complaint of left foot pain. Last seen in August 2019 for hip pain. Patient states that she is having left foot pain. Had a cyst drained by dermatologist over the 1st MTP 2 weeks ago. Cyst increased after 2 days. Fourth toe is numb also and is having pain on dorsal, lateral aspect of foot. Pain with walking and standing. Does stand on concrete at work. Did take antibiotic.  Patient states that unfortunately chronic pain in the foot.  Makes it difficult to even ambulate short distances at the moment.  Possibly getting worse.    Past Medical History:  Diagnosis Date  . 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 Breast cancer ([place])    sees onc and gen surg   . Mitral regurgitation    mild on echo from 2009, [name] MVP per notes  . Osteoarthritis    w/ bilat hip resurfacing and possible metal reaction, sees ortho at [facility]  . Scoliosis   . Uterine fibroid   . Varicose veins    goes to [place] [facility], s/p laser and inj tx   Past Surgical History:  Procedure Laterality Date  . BREAST LUMPECTOMY  09/12   left breast  . GANGLION CYST EXCISION    . left hip resurfaced 2011    . right hip resurfaced 2008    . unterine fibroid  1998   removal   Social History   Socioeconomic History  . Marital status: Married    Spouse name: Not on file  . Number of children: Not on file  . Years of education: Not on file  . Highest education level: Not on file  Occupational History  . Occupation: Counselling psychologist  Social Needs  . Financial resource strain: Not on file  . Food insecurity    Worry: Not on file    Inability: Not on file  . Transportation needs    Medical: Not on file    Non-medical: Not on file   Tobacco Use  . Smoking status: Never Smoker  . Smokeless tobacco: Never Used  Substance and Sexual Activity  . Alcohol use: Yes    Alcohol/week: 7.0 standard drinks    Types: 7 Glasses of wine per week  . Drug use: [name]  . Sexual activity: Yes    Birth control/protection: Post-menopausal  Lifestyle  . Physical activity    Days per week: Not on file    Minutes per session: Not on file  . Stress: Not on file  Relationships  . Social Herbalist on phone: Not on file    Gets together: Not on file    Attends religious service: Not on file    Active member of club or organization: Not on file    Attends meetings of clubs or organizations: Not on file    Relationship status: Not on file  Other Topics Concern  . 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 Erythromycin Diarrhea and Nausea  Only  . Latex     Sores in mouth   Family History  Problem Relation Age of Onset  . Stroke Mother 21       hemorrhagic  . Other Mother        pagets disease breast  . Arthritis Mother        foot, knee problems  . Hypertension Father   . Hyperlipidemia Father   . CAD Father 48       Triple bypass; died age 18  . Arthritis Father   . Dementia Father   . Breast cancer Maternal Aunt   . Esophageal cancer Maternal Grandfather 90  . Dementia Paternal Grandmother   . Heart disease Paternal Grandfather 68       multiple heart attacks  . Arthritis Paternal Uncle   . Colon cancer Neg Hx   . Stomach cancer Neg Hx   . Thyroid disease Neg Hx          Current Outpatient Medications (Other):  Marland Kitchen  Ascorbic Acid (VITA-C PO), Take by mouth. Marland Kitchen  BIOTIN 5000 PO, Take by mouth 2 (two) times a day.  Marland Kitchen  CALCIUM CITRATE PO, Take by mouth daily. .  Cholecalciferol (VITAMIN D3 PO), Take 2,000 Units by mouth 2  (two) times daily. Marland Kitchen  MAGNESIUM PO, Take by mouth daily. .  Multiple Vitamin (MULTI-VITAMIN DAILY PO), Take by mouth. .  NON FORMULARY, Inflamed-herbal anti inflammatory .  NON FORMULARY, supplement for liver .  Omega-3 Fatty Acids (FISH OIL) 500 MG CAPS, Take 690 mg by mouth 2 (two) times daily.  Marland Kitchen  OVER THE COUNTER MEDICATION, wob enzym .  OVER THE COUNTER MEDICATION, Digestive enzyme .  Probiotic Product (PROBIOTIC-10 PO), Take by mouth. .  tamoxifen (NOLVADEX) 20 MG tablet, Take by mouth.    Past medical history, social, surgical and family history all reviewed in electronic medical record.  [name] pertanent information unless stated regarding to the chief complaint.   Review of Systems:  [name] headache, visual changes, nausea, vomiting, diarrhea, constipation, dizziness, abdominal pain, skin rash, fevers, chills, night sweats, weight loss, swollen lymph nodes, body aches, joint swelling, m, chest pain, shortness of breath, mood changes.  Positive muscle aches  Objective  Blood pressure 110/62, pulse 80, height 5\' 7"  (1.702 m), weight 140 lb (63.5 kg), last menstrual period 12/05/2012, SpO2 98 %.    General: [name] apparent distress alert and oriented x3 mood and affect normal, dressed appropriately.  HEENT: Pupils equal, extraocular movements intact  Respiratory: Patient's speak in full sentences and does not appear short of breath  Cardiovascular: [name] lower extremity edema, non tender, [name] erythema  Skin: Warm dry intact with [name] signs of infection or rash on extremities or on axial skeleton.  Abdomen: Soft nontender  Neuro: Cranial nerves II through XII are intact, neurovascularly intact in all extremities with 2+ DTRs and 2+ pulses.  Lymph: [name] lymphadenopathy of posterior or anterior cervical chain or axillae bilaterally.  Gait antalgic MSK:  tender with full range of motion and good stability and symmetric strength and tone of shoulders, elbows, wrist, hip, knee and ankles bilaterally.   Left foot exam shows the patient has severe swelling of the first metatarsal.  Tender to palpation in the area.  Hallux limitus noted.  Patient's midfoot also has swelling noted on the dorsal aspect.  Severe tenderness to palpation over the fourth and fifth metatarsals.  Patient's ankle has some tightness and swelling around the peroneal tendons, ankle mild overpronation of  the hindfoot  Limited musculoskeletal ultrasound was performed and interpreted by Lyndal Pulley  Limited ultrasound of patient's first metatarsal shows significant synovitis and possible cyst formation noted.  Underlying moderate to severe arthritic changes.  Patient's midfoot at the fourth metatarsal is concerning for the possibility of a fracture.  Cortical defect noted.  Lisfranc joint.  Impression: Synovitis with cyst formation of the first metatarsal and underlying arthritis with a fourth proximal metatarsal fracture intra-articular concerning for Lisfranc  Procedure: Real-time Ultrasound Guided Injection of aspiration of the first metatarsal joint Device: GE Logiq Q7 Ultrasound guided injection is preferred based studies that show increased duration, increased effect, greater accuracy, decreased procedural pain, increased response rate, and decreased cost with ultrasound guided versus blind injection.  Verbal informed consent obtained.  Time-out conducted.  Noted [name] overlying erythema, induration, or other signs of local infection.  Skin prepped in a sterile fashion.  Local anesthesia: Topical Ethyl chloride.  With sterile technique and under real time ultrasound guidance: With an 18-gauge 1-1/2 inch needle patient was injected with 1 cc of 0.5% Marcaine and aspirated 20 cc of strawlike colored fluid.  Injected 1 cc of Kenalog 40 mg/mL then patient had some expression of gel-like material with pressure.  Mild blood loss.  Postinjection instructions given Completed without difficulty  Pain immediately resolved  suggesting accurate placement of the medication.  Advised to call if fevers/chills, erythema, induration, drainage, or persistent bleeding.  Images permanently stored and available for review in the ultrasound unit.  Impression: Technically successful ultrasound guided injection.    Impression and Recommendations:     This case required medical decision making of moderate complexity. The above documentation has been reviewed and is accurate and complete Lyndal Pulley, DO       Note: This dictation was prepared with Dragon dictation along with smaller phrase technology. Any transcriptional errors that result from this process are unintentional.

## 2019-06-21 NOTE — Patient Instructions (Addendum)
Good to see you.  Drained cyst Ice 20 minutes 2 times daily. Usually after activity and before bed. Cam walker daily Send message after weekend. If it comes back, then I'll send to foot specialist See me again in 3 weeks

## 2019-06-21 NOTE — Assessment & Plan Note (Signed)
Patient does have synovitis of the toe and does have what appears to be arthritic changes.  Patient had a ganglion cyst as well when we attempted pressure.  Patient was wrapped but there is a chance that it could potentially come back again.  Discussed with patient about icing regimen, proper shoes but secondary to patient having the midfoot fracture and concern for the possibility of Lisfranc fracture patient is going to be in a Cam walker.  Patient has reaccumulation of the fluid I would recommend patient seeing a foot and ankle specialist for further evaluation and treatment.

## 2019-06-21 NOTE — Assessment & Plan Note (Signed)
Arthritis noted ruling out fracture.  Due to the pain with weightbearing concern for a Lisfranc injury.  Patient will wear a Cam walker.  No improvement quickly in 2 to 3 weeks will need to consider either advanced imaging or referral to foot and ankle specialist.

## 2019-06-24 ENCOUNTER — Telehealth: Payer: Self-pay | Admitting: Adult Health

## 2019-06-24 NOTE — Telephone Encounter (Signed)
I talk with patient and she said this is the 3rd time rescheduled appointment

## 2019-07-05 ENCOUNTER — Encounter: Payer: Self-pay | Admitting: Family Medicine

## 2019-07-05 ENCOUNTER — Ambulatory Visit (INDEPENDENT_AMBULATORY_CARE_PROVIDER_SITE_OTHER): Payer: BC Managed Care – PPO | Admitting: Family Medicine

## 2019-07-05 ENCOUNTER — Other Ambulatory Visit: Payer: Self-pay

## 2019-07-05 ENCOUNTER — Ambulatory Visit: Payer: Self-pay

## 2019-07-05 VITALS — BP 102/78 | HR 67 | Ht 67.0 in | Wt 140.0 lb

## 2019-07-05 DIAGNOSIS — M79672 Pain in left foot: Secondary | ICD-10-CM

## 2019-07-05 DIAGNOSIS — M659 Synovitis and tenosynovitis, unspecified: Secondary | ICD-10-CM

## 2019-07-05 NOTE — Patient Instructions (Signed)
Call you when we get orthotics Look into rocker bottom shoes [place] 5 or [place] Ok to use solid bottom shoe now Ice after activity  See me in 4-6 weeks

## 2019-07-05 NOTE — Progress Notes (Signed)
[name] [address], [place] [address] Phone: [phone] Subjective:   [facility]abel [name], am serving as a scribe for Dr. Hulan Saas.  I'm seeing this patient by the request  of:    CC: Foot pain follow-up  SWN:[id number]   06/21/2019: Patient does have synovitis of the toe and does have what appears to be arthritic changes.  Patient had a ganglion cyst as well when we attempted pressure.  Patient was wrapped but there is a chance that it could potentially come back again.  Discussed with patient about icing regimen, proper shoes but secondary to patient having the midfoot fracture and concern for the possibility of Lisfranc fracture patient is going to be in a Cam walker.  Patient has reaccumulation of the fluid I would recommend patient seeing a foot and ankle specialist for further evaluation and treatment.  Update 07/05/2019: Isabel [name] is a 63 y.o. female coming in with complaint of left foot pain. Patient states that the swelling did not come back as much as before. Has been wearing the boot which has helped her pain. Does still have numbness in the 3rd toe on the back side of toe.      Past Medical History:  Diagnosis Date  . 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 Breast cancer ([place])    sees onc and gen surg   . Mitral regurgitation    mild on echo from 2009, [name] MVP per notes  . Osteoarthritis    w/ bilat hip resurfacing and possible metal reaction, sees ortho at [facility]  . Scoliosis   . Uterine fibroid   . Varicose veins    goes to [place] [facility], s/p laser and inj tx   Past Surgical History:  Procedure Laterality Date  . BREAST LUMPECTOMY  09/12   left breast  . GANGLION CYST EXCISION    . left hip resurfaced 2011    . right hip resurfaced 2008    . unterine fibroid  1998   removal   Social History   Socioeconomic History  . Marital status: Married    Spouse name: Not on file  . Number of  children: Not on file  . Years of education: Not on file  . Highest education level: Not on file  Occupational History  . Occupation: Counselling psychologist  Social Needs  . Financial resource strain: Not on file  . Food insecurity    Worry: Not on file    Inability: Not on file  . Transportation needs    Medical: Not on file    Non-medical: Not on file  Tobacco Use  . Smoking status: Never Smoker  . Smokeless tobacco: Never Used  Substance and Sexual Activity  . Alcohol use: Yes    Alcohol/week: 7.0 standard drinks    Types: 7 Glasses of wine per week  . Drug use: [name]  . Sexual activity: Yes    Birth control/protection: Post-menopausal  Lifestyle  . Physical activity    Days per week: Not on file    Minutes per session: Not on file  . Stress: Not on file  Relationships  . Social Herbalist on phone: Not on file    Gets together: Not on file    Attends religious service: Not on file    Active member of club or organization: Not on file    Attends meetings of clubs or organizations: Not on file  Relationship status: Not on file  Other Topics Concern  . 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 Erythromycin Diarrhea and Nausea Only  . Latex     Sores in mouth   Family History  Problem Relation Age of Onset  . Stroke Mother 64       hemorrhagic  . Other Mother        pagets disease breast  . Arthritis Mother        foot, knee problems  . Hypertension Father   . Hyperlipidemia Father   . CAD Father 64       Triple bypass; died age 24  . Arthritis Father   . Dementia Father   . Breast cancer Maternal Aunt   . Esophageal cancer Maternal Grandfather 90  . Dementia Paternal Grandmother   . Heart disease Paternal Grandfather 32       multiple heart attacks  .  Arthritis Paternal Uncle   . Colon cancer Neg Hx   . Stomach cancer Neg Hx   . Thyroid disease Neg Hx          Current Outpatient Medications (Other):  Marland Kitchen  Ascorbic Acid (VITA-C PO), Take by mouth. Marland Kitchen  BIOTIN 5000 PO, Take by mouth 2 (two) times a day.  Marland Kitchen  CALCIUM CITRATE PO, Take by mouth daily. .  Cholecalciferol (VITAMIN D3 PO), Take 2,000 Units by mouth 2 (two) times daily. Marland Kitchen  MAGNESIUM PO, Take by mouth daily. .  Multiple Vitamin (MULTI-VITAMIN DAILY PO), Take by mouth. .  NON FORMULARY, Inflamed-herbal anti inflammatory .  NON FORMULARY, supplement for liver .  Omega-3 Fatty Acids (FISH OIL) 500 MG CAPS, Take 690 mg by mouth 2 (two) times daily.  Marland Kitchen  OVER THE COUNTER MEDICATION, wob enzym .  OVER THE COUNTER MEDICATION, Digestive enzyme .  Probiotic Product (PROBIOTIC-10 PO), Take by mouth. .  tamoxifen (NOLVADEX) 20 MG tablet, Take by mouth.    Past medical history, social, surgical and family history all reviewed in electronic medical record.  [name] pertanent information unless stated regarding to the chief complaint.   Review of Systems:  [name] headache, visual changes, nausea, vomiting, diarrhea, constipation, dizziness, abdominal pain, skin rash, fevers, chills, night sweats, weight loss, swollen lymph nodes, body aches, joint swelling, muscle aches, chest pain, shortness of breath, mood changes.   Objective  Blood pressure 102/78, pulse 67, height 5\' 7"  (1.702 m), weight 140 lb (63.5 kg), last menstrual period 12/05/2012, SpO2 97 %. Systems examined below as of    General: [name] apparent distress alert and oriented x3 mood and affect normal, dressed appropriately.  HEENT: Pupils equal, extraocular movements intact  Respiratory: Patient's speak in full sentences and does not appear short of breath  Cardiovascular: [name] lower extremity edema, non tender, [name] erythema  Skin: Warm dry intact with [name] signs of infection or rash on extremities or on axial skeleton.  Abdomen: Soft  nontender  Neuro: Cranial nerves II through XII are intact, neurovascularly intact in all extremities with 2+ DTRs and 2+ pulses.  Lymph: [name] lymphadenopathy of posterior or anterior cervical chain or axillae bilaterally.  Gait normal with good balance and coordination.  MSK:  tender with full range of motion and good stability and symmetric strength and tone of  shoulders, elbows, wrist, hip, knee and ankles bilaterally.  Mild arthritic changes of multiple joints Patient does have on the left foot still some mild swelling of the first MTP.  Significantly improved from previous exam.  Hallux rigidus noted.  Patient has significant arthritic changes of the midfoot as well.  Patient does have breakdown of the transverse arch.  Ankle though does have decent range of motion overall.  Neurovascular intact distally.  Limited musculoskeletal ultrasound was performed and interpreted by Lyndal Pulley  Limited ultrasound of patient's first MTP shows the patient still has a very mild system still noted but the large cyst is gone at the moment.  Patient synovitis is significantly improved as well. Impression: Midfoot arthritis, first toe arthritis, improvement in synovitis and cyst formation   Impression and Recommendations:     This case required medical decision making of moderate complexity. The above documentation has been reviewed and is accurate and complete Lyndal Pulley, DO       Note: This dictation was prepared with Dragon dictation along with smaller phrase technology. Any transcriptional errors that result from this process are unintentional.

## 2019-07-05 NOTE — Assessment & Plan Note (Signed)
Improvement noted but secondary to the midfoot arthritis will have patient actually fitted for custom orthotics.  I think she will do well with this.  Discussed other over-the-counter shoes that I think will be more beneficial as well.  Discussed icing regimen and home exercises.  Discussed avoiding certain shoes as well as walking around barefoot.  Follow-up again in 4 to 8 weeks

## 2019-07-10 ENCOUNTER — Ambulatory Visit (INDEPENDENT_AMBULATORY_CARE_PROVIDER_SITE_OTHER): Payer: BC Managed Care – PPO | Admitting: Family Medicine

## 2019-07-10 ENCOUNTER — Other Ambulatory Visit: Payer: Self-pay

## 2019-07-10 ENCOUNTER — Encounter: Payer: Self-pay | Admitting: Family Medicine

## 2019-07-10 VITALS — BP 108/68 | HR 66 | Temp 97.3°F | Ht 65.75 in | Wt 140.7 lb

## 2019-07-10 DIAGNOSIS — E559 Vitamin D deficiency, unspecified: Secondary | ICD-10-CM | POA: Diagnosis not present

## 2019-07-10 DIAGNOSIS — E042 Nontoxic multinodular goiter: Secondary | ICD-10-CM | POA: Diagnosis not present

## 2019-07-10 DIAGNOSIS — R739 Hyperglycemia, unspecified: Secondary | ICD-10-CM | POA: Diagnosis not present

## 2019-07-10 DIAGNOSIS — M159 Polyosteoarthritis, unspecified: Secondary | ICD-10-CM | POA: Diagnosis not present

## 2019-07-10 DIAGNOSIS — Z0000 Encounter for general adult medical examination without abnormal findings: Secondary | ICD-10-CM | POA: Diagnosis not present

## 2019-07-10 DIAGNOSIS — R5383 Other fatigue: Secondary | ICD-10-CM | POA: Diagnosis not present

## 2019-07-10 DIAGNOSIS — Z13220 Encounter for screening for lipoid disorders: Secondary | ICD-10-CM

## 2019-07-10 LAB — CBC WITH DIFFERENTIAL/PLATELET
Basophils Absolute: 0 10*3/uL (ref 0.0–0.1)
Basophils Relative: 0.9 % (ref 0.0–3.0)
Eosinophils Absolute: 0.1 10*3/uL (ref 0.0–0.7)
Eosinophils Relative: 2.2 % (ref 0.0–5.0)
HCT: 41.3 % (ref 36.0–46.0)
Hemoglobin: 13.9 g/dL (ref 12.0–15.0)
Lymphocytes Relative: 33.1 % (ref 12.0–46.0)
Lymphs Abs: 1.5 10*3/uL (ref 0.7–4.0)
MCHC: 33.7 g/dL (ref 30.0–36.0)
MCV: 87.6 fl (ref 78.0–100.0)
Monocytes Absolute: 0.5 10*3/uL (ref 0.1–1.0)
Monocytes Relative: 9.8 % (ref 3.0–12.0)
Neutro Abs: 2.5 10*3/uL (ref 1.4–7.7)
Neutrophils Relative %: 54 % (ref 43.0–77.0)
Platelets: 212 10*3/uL (ref 150.0–400.0)
RBC: 4.72 Mil/uL (ref 3.87–5.11)
RDW: 12.5 % (ref 11.5–15.5)
WBC: 4.6 10*3/uL (ref 4.0–10.5)

## 2019-07-10 LAB — COMPREHENSIVE METABOLIC PANEL
ALT: 16 U/L (ref 0–35)
AST: 20 U/L (ref 0–37)
Albumin: 4.6 g/dL (ref 3.5–5.2)
Alkaline Phosphatase: 60 U/L (ref 39–117)
BUN: 14 mg/dL (ref 6–23)
CO2: 30 mEq/L (ref 19–32)
Calcium: 9.8 mg/dL (ref 8.4–10.5)
Chloride: 103 mEq/L (ref 96–112)
Creatinine, Ser: 0.55 mg/dL (ref 0.40–1.20)
GFR: 112.15 mL/min (ref >60.00)
Glucose, Bld: 89 mg/dL (ref 70–99)
Potassium: 4.5 mEq/L (ref 3.5–5.1)
Sodium: 140 mEq/L (ref 135–145)
Total Bilirubin: 0.5 mg/dL (ref 0.2–1.2)
Total Protein: 6.7 g/dL (ref 6.0–8.3)

## 2019-07-10 LAB — TSH: TSH: 0.8 u[IU]/mL (ref 0.35–4.50)

## 2019-07-10 LAB — VITAMIN B12: Vitamin B-12: 578 pg/mL (ref 211–911)

## 2019-07-10 LAB — LIPID PANEL
Cholesterol: 172 mg/dL (ref 0–200)
HDL: 84.2 mg/dL (ref >39.00)
LDL Cholesterol: 72 mg/dL (ref 0–99)
NonHDL: 87.98
Total CHOL/HDL Ratio: 2
Triglycerides: 82 mg/dL (ref 0.0–149.0)
VLDL: 16.4 mg/dL (ref 0.0–40.0)

## 2019-07-10 LAB — HEMOGLOBIN A1C: Hgb A1c MFr Bld: 5.8 % (ref 4.6–6.5)

## 2019-07-10 LAB — VITAMIN D 25 HYDROXY (VIT D DEFICIENCY, FRACTURES): VITD: 74.93 ng/mL (ref 30.00–100.00)

## 2019-07-10 NOTE — Addendum Note (Signed)
Addended by: Elmer Picker on: 07/10/2019 10:01 AM   Modules accepted: Orders

## 2019-07-10 NOTE — Progress Notes (Signed)
Isabel [name] DOB: 18-Mar-1956 Encounter date: 07/10/2019  This is a 63 y.o. female who presents for complete physical  Last bloodwork at our office was 02/2018  History of present illness/Additional concerns: Having multiple foot issues - cysts, bunions. Awaiting inserts right now. Following with specialist.   Breast CA: on tamoxifen; following with onc/gyn Following with chiropractor to help with hips/back. This is helpful for her. Hips have been resurfaced so this helps when joints are aggravated. Also has scoliosis.   Exercise class disbanded due to [place].   Follows with gyn yearly.  Follows with integrative medicine. Most of what was done was not covered by insurance, so was financially difficult.  Hasn't done skin check in awhile with derm, but has relationship with Dr. Ilean China office.   Energy level slightly lower. Started B vitamin to try and help.    Past Medical History:  Diagnosis Date  . 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 Breast cancer ([place])    sees onc and gen surg   . Mitral regurgitation    mild on echo from 2009, no MVP per notes  . Osteoarthritis    w/ bilat hip resurfacing and possible metal reaction, sees ortho at [facility]  . Scoliosis   . Uterine fibroid   . Varicose veins    goes to [place] [facility], s/p laser and inj tx   Past Surgical History:  Procedure Laterality Date  . BREAST LUMPECTOMY  09/12   left breast  . GANGLION CYST EXCISION    . left hip resurfaced 2011    . right hip resurfaced 2008    . unterine fibroid  1998   removal   Allergies  Allergen Reactions  . Erythromycin Diarrhea and Nausea Only  . Latex     Sores in mouth   Current Meds  Medication Sig  . Ascorbic Acid (VITA-C PO) Take by mouth.  Marland Kitchen BIOTIN 5000 PO Take by mouth 2 (two) times a day.   Marland Kitchen CALCIUM CITRATE PO Take by mouth daily.  . Cholecalciferol (VITAMIN D3 PO) Take 4,000 Units by mouth 2 (two) times daily.   Marland Kitchen MAGNESIUM PO Take by mouth daily.   . Multiple Vitamin (MULTI-VITAMIN DAILY PO) Take by mouth.  . NON FORMULARY Inflamed-herbal anti inflammatory  . NON FORMULARY supplement for liver  . Omega-3 Fatty Acids (FISH OIL) 500 MG CAPS Take 690 mg by mouth 2 (two) times daily.   Marland Kitchen OVER THE COUNTER MEDICATION wob enzym  . OVER THE COUNTER MEDICATION Digestive enzyme  . Probiotic Product (PROBIOTIC-10 PO) Take by mouth.  . [DISCONTINUED] tamoxifen (NOLVADEX) 20 MG tablet Take by mouth.   Social History   Tobacco Use  . Smoking status: Never Smoker  . Smokeless tobacco: Never Used  Substance Use Topics  . Alcohol use: Yes    Alcohol/week: 7.0 standard drinks    Types: 7 Glasses of wine per week   Family History  Problem Relation Age of Onset  . Stroke Mother 25       hemorrhagic  . Other Mother        pagets disease breast  . Arthritis Mother        foot, knee problems  . Hypertension Father   . Hyperlipidemia Father   . CAD Father 60       Triple bypass; died age 18  . Arthritis Father   . Dementia Father   . Breast cancer Maternal Aunt   . Esophageal cancer Maternal  Grandfather 90  . Dementia Paternal Grandmother   . Heart disease Paternal Grandfather 11       multiple heart attacks  . Arthritis Paternal Uncle   . Colon cancer Neg Hx   . Stomach cancer Neg Hx   . Thyroid disease Neg Hx      Review of Systems  Constitutional: Positive for fatigue (not as good as in past). Negative for activity change, appetite change, chills, fever and unexpected weight change.  HENT: Negative for congestion, ear pain, hearing loss, sinus pressure, sinus pain, sore throat and trouble swallowing.   Eyes: Negative for pain and visual disturbance.  Respiratory: Negative for cough, chest tightness, shortness of breath and wheezing.   Cardiovascular: Negative for chest pain, palpitations and leg swelling.  Gastrointestinal: Negative for abdominal pain, blood in stool, constipation, diarrhea, nausea and vomiting.  Genitourinary:  Negative for difficulty urinating and menstrual problem.  Musculoskeletal: Positive for arthralgias, back pain and gait problem.  Skin: Negative for rash.  Neurological: Negative for dizziness, weakness, numbness and headaches.  Hematological: Negative for adenopathy. Does not bruise/bleed easily.  Psychiatric/Behavioral: Positive for sleep disturbance (some difficulty falling asleep). Negative for suicidal ideas. The patient is not nervous/anxious.     CBC:  Lab Results  Component Value Date   WBC 4.1 02/06/2018   HGB 14.4 02/06/2018   HGB 13.3 05/29/2014   HCT 42.0 02/06/2018   HCT 39.9 05/29/2014   MCH 30.2 05/29/2014   MCH 30.6 10/21/2011   MCHC 34.3 02/06/2018   RDW 12.3 02/06/2018   RDW 12.9 05/29/2014   PLT 226.0 02/06/2018   PLT 178 05/29/2014   CMP: Lab Results  Component Value Date   NA 140 02/06/2018   NA 140 05/29/2014   K 4.3 02/06/2018   K 4.3 05/29/2014   CL 103 02/06/2018   CL 105 03/01/2013   CO2 31 02/06/2018   CO2 27 05/29/2014   ANIONGAP 8 05/29/2014   GLUCOSE 89 02/06/2018   GLUCOSE 97 05/29/2014   GLUCOSE 101 (H) 03/01/2013   BUN 16 02/06/2018   BUN 15.3 05/29/2014   CREATININE 0.58 02/06/2018   CREATININE 0.8 05/29/2014   GFRAA >90 10/21/2011   CALCIUM 10.0 02/06/2018   CALCIUM 9.1 05/29/2014   PROT 6.6 12/02/2015   PROT 6.5 05/29/2014   BILITOT 0.5 12/02/2015   BILITOT 0.47 05/29/2014   ALKPHOS 48 12/02/2015   ALKPHOS 51 05/29/2014   ALT 18 12/02/2015   ALT 25 05/29/2014   AST 18 12/02/2015   AST 21 05/29/2014   LIPID: Lab Results  Component Value Date   CHOL 176 02/06/2018   TRIG 65.0 02/06/2018   HDL 88.20 02/06/2018   LDLCALC 75 02/06/2018    Objective:  BP 108/68 (BP Location: Left Arm, Patient Position: Sitting, Cuff Size: Normal)   Pulse 66   Temp (!) 97.3 F (36.3 C) (Temporal)   Ht 5' 5.75" (1.67 m)   Wt 140 lb 11.2 oz (63.8 kg)   LMP 12/05/2012   SpO2 97%   BMI 22.88 kg/m   Weight: 140 lb 11.2 oz (63.8 kg)    BP Readings from Last 3 Encounters:  07/10/19 108/68  07/05/19 102/78  06/21/19 110/62   Wt Readings from Last 3 Encounters:  07/10/19 140 lb 11.2 oz (63.8 kg)  07/05/19 140 lb (63.5 kg)  06/21/19 140 lb (63.5 kg)    Physical Exam Constitutional:      General: She is not in acute distress.    Appearance: She  is well-developed.  HENT:     Head: Normocephalic and atraumatic.     Right Ear: External ear normal.     Left Ear: External ear normal.     Mouth/Throat:     Pharynx: No oropharyngeal exudate.  Eyes:     Conjunctiva/sclera: Conjunctivae normal.     Pupils: Pupils are equal, round, and reactive to light.  Neck:     Musculoskeletal: Normal range of motion and neck supple.     Thyroid: No thyromegaly.  Cardiovascular:     Rate and Rhythm: Normal rate and regular rhythm.     Heart sounds: Normal heart sounds. No murmur. No friction rub. No gallop.   Pulmonary:     Effort: Pulmonary effort is normal.     Breath sounds: Normal breath sounds.  Abdominal:     General: Bowel sounds are normal. There is no distension.     Palpations: Abdomen is soft. There is no mass.     Tenderness: There is no abdominal tenderness. There is no guarding.     Hernia: No hernia is present.  Musculoskeletal: Normal range of motion.        General: No tenderness or deformity.  Lymphadenopathy:     Cervical: No cervical adenopathy.  Skin:    General: Skin is warm and dry.     Findings: No rash.  Neurological:     Mental Status: She is alert and oriented to person, place, and time.     Deep Tendon Reflexes: Reflexes normal.     Reflex Scores:      Tricep reflexes are 2+ on the right side and 2+ on the left side.      Bicep reflexes are 2+ on the right side and 2+ on the left side.      Brachioradialis reflexes are 2+ on the right side and 2+ on the left side.      Patellar reflexes are 2+ on the right side and 2+ on the left side. Psychiatric:        Speech: Speech normal.         Behavior: Behavior normal.        Thought Content: Thought content normal.     Assessment/Plan: Health Maintenance Due  Topic Date Due  . INFLUENZA VACCINE  07/06/2019   Health Maintenance reviewed.  1. Preventative health care Get started back with regular exercise. Keep up with healthy eating.   2. Osteoarthritis of multiple joints, unspecified osteoarthritis type Keep following up with Dr. Tamala Julian. We discussed that he does osteopathic work as well if she is interested since she was previously getting muscle treatment with Dr. Maudie Mercury.  3. Multinodular goiter [place] stable; recheck 05/2020  4. Hyperglycemia - Hemoglobin A1c; Future  5. Other fatigue - CBC with Differential/Platelet; Future - Comprehensive metabolic panel; Future - TSH; Future - Vitamin B12; Future  6. Vitamin D deficiency - VITAMIN D 25 Hydroxy (Vit-D Deficiency, Fractures); Future  7. Lipid screening - Lipid panel; Future  Return pending bloodwork.  Micheline Rough, MD

## 2019-07-10 NOTE — Patient Instructions (Addendum)
Shingles  Shingles, which is also known as herpes zoster, is an infection that causes a painful skin rash and fluid-filled blisters. It is caused by a virus. Shingles only develops in people who:  Have had chickenpox.  Have been given a medicine to protect against chickenpox (have been vaccinated). Shingles is rare in this group. What are the causes? Shingles is caused by varicella-zoster virus (VZV). This is the same virus that causes chickenpox. After a person is exposed to VZV, the virus stays in the body in an inactive (dormant) state. Shingles develops if the virus is reactivated. This can happen many years after the first (initial) exposure to VZV. It is not known what causes this virus to be reactivated. What increases the risk? People who have had chickenpox or received the chickenpox vaccine are at risk for shingles. Shingles infection is more common in people who:  Are older than age 60.  Have a weakened disease-fighting system (immune system), such as people with: ? HIV. ? AIDS. ? Cancer.  Are taking medicines that weaken the immune system, such as transplant medicines.  Are experiencing a lot of stress. What are the signs or symptoms? Early symptoms of this condition include itching, tingling, and pain in an area on your skin. Pain may be described as burning, stabbing, or throbbing. A few days or weeks after early symptoms start, a painful red rash appears. The rash is usually on one side of the body and has a band-like or belt-like pattern. The rash eventually turns into fluid-filled blisters that break open, change into scabs, and dry up in about 2-3 weeks. At any time during the infection, you may also develop:  A fever.  Chills.  A headache.  An upset stomach. How is this diagnosed? This condition is diagnosed with a skin exam. Skin or fluid samples may be taken from the blisters before a diagnosis is made. These samples are examined under a microscope or sent to  a lab for testing. How is this treated? The rash may last for several weeks. There is not a specific cure for this condition. Your health care provider will probably prescribe medicines to help you manage pain, recover more quickly, and avoid long-term problems. Medicines may include:  Antiviral drugs.  Anti-inflammatory drugs.  Pain medicines.  Anti-itching medicines (antihistamines). If the area involved is on your face, you may be referred to a specialist, such as an eye doctor (ophthalmologist) or an ear, nose, and throat (ENT) doctor (otolaryngologist) to help you avoid eye problems, chronic pain, or disability. Follow these instructions at home: Medicines  Take over-the-counter and prescription medicines only as told by your health care provider.  Apply an anti-itch cream or numbing cream to the affected area as told by your health care provider. Relieving itching and discomfort   Apply cold, wet cloths (cold compresses) to the area of the rash or blisters as told by your health care provider.  Cool baths can be soothing. Try adding baking soda or dry oatmeal to the water to reduce itching. Do not bathe in hot water. Blister and rash care  Keep your rash covered with a loose bandage (dressing). Wear loose-fitting clothing to help ease the pain of material rubbing against the rash.  Keep your rash and blisters clean by washing the area with mild soap and cool water as told by your health care provider.  Check your rash every day for signs of infection. Check for: ? More redness, swelling, or pain. ? Fluid   or blood. ? Warmth. ? Pus or a bad smell.  Do not scratch your rash or pick at your blisters. To help avoid scratching: ? Keep your fingernails clean and cut short. ? Wear gloves or mittens while you sleep, if scratching is a problem. General instructions  Rest as told by your health care provider.  Keep all follow-up visits as told by your health care provider. This  is important.  Wash your hands often with soap and water. If soap and water are not available, use hand sanitizer. Doing this lowers your chance of getting a bacterial skin infection.  Before your blisters change into scabs, your shingles infection can cause chickenpox in people who have never had it or have never been vaccinated against it. To prevent this from happening, avoid contact with other people, especially: ? Babies. ? Pregnant women. ? Children who have eczema. ? Elderly people who have transplants. ? People who have chronic illnesses, such as cancer or AIDS. Contact a health care provider if:  Your pain is not relieved with prescribed medicines.  Your pain does not get better after the rash heals.  You have signs of infection in the rash area, such as: ? More redness, swelling, or pain around the rash. ? Fluid or blood coming from the rash. ? The rash area feeling warm to the touch. ? Pus or a bad smell coming from the rash. Get help right away if:  The rash is on your face or nose.  You have facial pain, pain around your eye area, or loss of feeling on one side of your face.  You have difficulty seeing.  You have ear pain or have ringing in your ear.  You have a loss of taste.  Your condition gets worse. Summary  Shingles, which is also known as herpes zoster, is an infection that causes a painful skin rash and fluid-filled blisters.  This condition is diagnosed with a skin exam. Skin or fluid samples may be taken from the blisters and examined before the diagnosis is made.  Keep your rash covered with a loose bandage (dressing). Wear loose-fitting clothing to help ease the pain of material rubbing against the rash.  Before your blisters change into scabs, your shingles infection can cause chickenpox in people who have never had it or have never been vaccinated against it. This information is not intended to replace advice given to you by your health care  provider. Make sure you discuss any questions you have with your health care provider. Document Released: 11/21/2005 Document Revised: 03/15/2019 Document Reviewed: 07/26/2017 Elsevier Patient Education  2020 [employer].   https://www.cdc.gov/vaccines/hcp/vis/vis-statements/tdap.pdf">  Tdap Vaccine (Tetanus, Diphtheria and Pertussis): What You Need to Know 1. Why get vaccinated? Tetanus, diphtheria and pertussis are very serious diseases. Tdap vaccine can protect [place] from these diseases. And, Tdap vaccine given to pregnant women can protect newborn babies against pertussis.Marland Kitchen TETANUS (Lockjaw) is rare in the [place] today. It causes painful muscle tightening and stiffness, usually all over the body.  It can lead to tightening of muscles in the head and neck so you can't open your mouth, swallow, or sometimes even breathe. Tetanus kills about 1 out of 10 people who are infected even after receiving the best medical care. DIPHTHERIA is also rare in the [place] today. It can cause a thick coating to form in the back of the throat.  It can lead to breathing problems, heart failure, paralysis, and death. PERTUSSIS (Whooping Cough) causes severe  coughing spells, which can cause difficulty breathing, vomiting and disturbed sleep.  It can also lead to weight loss, incontinence, and rib fractures. Up to 2 in 100 adolescents and 5 in 100 adults with pertussis are hospitalized or have complications, which could include pneumonia or death. These diseases are caused by bacteria. Diphtheria and pertussis are spread from person to person through secretions from coughing or sneezing. Tetanus enters the body through cuts, scratches, or wounds. Before vaccines, as many as 200,000 cases of diphtheria, 200,000 cases of pertussis, and hundreds of cases of tetanus, were reported in the [place] each year. Since vaccination began, reports of cases for tetanus and diphtheria have dropped by about 99%  and for pertussis by about 80%. 2. Tdap vaccine Tdap vaccine can protect adolescents and adults from tetanus, diphtheria, and pertussis. One dose of Tdap is routinely given at age 60 or 4. People who did not get Tdap at that age should get it as soon as possible. Tdap is especially important for healthcare professionals and anyone having close contact with a baby younger than 12 months. Pregnant women should get a dose of Tdap during every pregnancy, to protect the newborn from pertussis. Infants are most at risk for severe, life-threatening complications from pertussis. Another vaccine, called Td, protects against tetanus and diphtheria, but not pertussis. A Td booster should be given every 10 years. Tdap may be given as one of these boosters if you have never gotten Tdap before. Tdap may also be given after a severe cut or burn to prevent tetanus infection. Your doctor or the person giving you the vaccine can give you more information. Tdap may safely be given at the same time as other vaccines. 3. Some people should not get this vaccine  A person who has ever had a life-threatening allergic reaction after a previous dose of any diphtheria, tetanus or pertussis containing vaccine, OR has a severe allergy to any part of this vaccine, should not get Tdap vaccine. Tell the person giving the vaccine about any severe allergies.  Anyone who had coma or long repeated seizures within 7 days after a childhood dose of DTP or DTaP, or a previous dose of Tdap, should not get Tdap, unless a cause other than the vaccine was found. They can still get Td.  Talk to your doctor if you: ? have seizures or another nervous system problem, ? had severe pain or swelling after any vaccine containing diphtheria, tetanus or pertussis, ? ever had a condition called Guillain-Barr Syndrome (GBS), ? aren't feeling well on the day the shot is scheduled. 4. Risks With any medicine, including vaccines, there is a chance of  side effects. These are usually mild and go away on their own. Serious reactions are also possible but are rare. Most people who get Tdap vaccine do not have any problems with it. Mild problems following Tdap (Did not interfere with activities)  Pain where the shot was given (about 3 in 4 adolescents or 2 in 3 adults)  Redness or swelling where the shot was given (about 1 person in 5)  Mild fever of at least 100.10F (up to about 1 in 25 adolescents or 1 in 100 adults)  Headache (about 3 or 4 people in 10)  Tiredness (about 1 person in 3 or 4)  Nausea, vomiting, diarrhea, stomach ache (up to 1 in 4 adolescents or 1 in 10 adults)  Chills, sore joints (about 1 person in 10)  Body aches (about 1 person in  3 or 4)  Rash, swollen glands (uncommon) Moderate problems following Tdap (Interfered with activities, but did not require medical attention)  Pain where the shot was given (up to 1 in 5 or 6)  Redness or swelling where the shot was given (up to about 1 in 16 adolescents or 1 in 12 adults)  Fever over 102F (about 1 in 100 adolescents or 1 in 250 adults)  Headache (about 1 in 7 adolescents or 1 in 10 adults)  Nausea, vomiting, diarrhea, stomach ache (up to 1 or 3 people in 100)  Swelling of the entire arm where the shot was given (up to about 1 in 500). Severe problems following Tdap (Unable to perform usual activities; required medical attention)  Swelling, severe pain, bleeding and redness in the arm where the shot was given (rare). Problems that could happen after any vaccine:  People sometimes faint after a medical procedure, including vaccination. Sitting or lying down for about 15 minutes can help prevent fainting, and injuries caused by a fall. Tell your doctor if you feel dizzy, or have vision changes or ringing in the ears.  Some people get severe pain in the shoulder and have difficulty moving the arm where a shot was given. This happens very rarely.  Any  medication can cause a severe allergic reaction. Such reactions from a vaccine are very rare, estimated at fewer than 1 in a million doses, and would happen within a few minutes to a few hours after the vaccination. As with any medicine, there is a very remote chance of a vaccine causing a serious injury or death. The safety of vaccines is always being monitored. For more information, visit: http://www.aguilar.org/ 5. What if there is a serious problem? What should I look for?  Look for anything that concerns you, such as signs of a severe allergic reaction, very high fever, or unusual behavior. Signs of a severe allergic reaction can include hives, swelling of the face and throat, difficulty breathing, a fast heartbeat, dizziness, and weakness. These would usually start a few minutes to a few hours after the vaccination. What should I do?  If you think it is a severe allergic reaction or other emergency that can't wait, call 9-1-1 or get the person to the nearest hospital. Otherwise, call your doctor.  Afterward, the reaction should be reported to the Vaccine Adverse Event Reporting System (VAERS). Your doctor might file this report, or you can do it yourself through the VAERS web site at www.vaers.SamedayNews.es, or by calling 6094191314. VAERS does not give medical advice. 6. The National Vaccine Injury Compensation Program The [employer] Vaccine Injury Compensation Program (VICP) is a federal program that was created to compensate people who may have been injured by certain vaccines. Persons who believe they may have been injured by a vaccine can learn about the program and about filing a claim by calling 2702689894 or visiting the [employer] website at GoldCloset.com.ee. There is a time limit to file a claim for compensation. 7. How can I learn more?  Ask your doctor. He or she can give you the vaccine package insert or suggest other sources of information.  Call your local or  state health department.  Contact the Centers for Disease Control and Prevention (CDC): ? Call 862-708-1054 (1-800-CDC-INFO) or ? Visit CDC's website at http://hunter.com/ Vaccine Information Statement Tdap Vaccine (01/28/2014) This information is not intended to replace advice given to you by your health care provider. Make sure you discuss any questions you have with your  health care provider. Document Released: 05/22/2012 Document Revised: 07/09/2018 Document Reviewed: 07/09/2018 Elsevier Interactive Patient Education  [employer].

## 2019-07-19 ENCOUNTER — Encounter: Payer: BLUE CROSS/BLUE SHIELD | Admitting: Adult Health

## 2019-07-22 ENCOUNTER — Encounter: Payer: Self-pay | Admitting: Family Medicine

## 2019-07-22 ENCOUNTER — Other Ambulatory Visit: Payer: Self-pay

## 2019-07-22 ENCOUNTER — Ambulatory Visit (INDEPENDENT_AMBULATORY_CARE_PROVIDER_SITE_OTHER): Payer: BC Managed Care – PPO | Admitting: Family Medicine

## 2019-07-22 DIAGNOSIS — M19079 Primary osteoarthritis, unspecified ankle and foot: Secondary | ICD-10-CM | POA: Diagnosis not present

## 2019-07-22 NOTE — Progress Notes (Signed)
  Procedure Note   Patient was fitted for a : standard, cushioned, semi-rigid orthotic. The orthotic was heated and afterward the patient patient seated position and molded The patient was positioned in subtalar neutral position and 10 degrees of ankle dorsiflexion in a weight bearing stance. After completion of molding, patient did have orthotic management The blank was ground to a stable position for weight bearing. Size: 8.5 Base: Carbon fiber Additional Posting and Padding: left and right medial 300/110 270/90 lateral 200/40 250/35 transverse arch 250/35 The patient ambulated these, and they were very comfortable.

## 2019-08-09 ENCOUNTER — Encounter: Payer: Self-pay | Admitting: Family Medicine

## 2019-08-09 ENCOUNTER — Other Ambulatory Visit: Payer: Self-pay

## 2019-08-09 ENCOUNTER — Ambulatory Visit: Payer: BC Managed Care – PPO | Admitting: Family Medicine

## 2019-08-09 ENCOUNTER — Ambulatory Visit: Payer: Self-pay

## 2019-08-09 VITALS — BP 100/64 | HR 92 | Ht 65.75 in | Wt 140.0 lb

## 2019-08-09 DIAGNOSIS — M659 Synovitis and tenosynovitis, unspecified: Secondary | ICD-10-CM | POA: Diagnosis not present

## 2019-08-09 DIAGNOSIS — M79671 Pain in right foot: Secondary | ICD-10-CM

## 2019-08-09 NOTE — Progress Notes (Signed)
[name] [address] [place], [place] [address] Phone: [phone] Subjective:   Isabel [name], am serving as a scribe for Dr. Hulan Saas.  I'm seeing this patient by the request  of:    CC: Foot pain  [id number]   07/05/2019 Improvement noted but secondary to the midfoot arthritis will have patient actually fitted for custom orthotics.  I think she will do well with this.  Discussed other over-the-counter shoes that I think will be more beneficial as well.  Discussed icing regimen and home exercises.  Discussed avoiding certain shoes as well as walking around barefoot.  Follow-up again in 4 to 8 weeks   Update 08/09/2019 Isabel [name] is a 63 y.o. female coming in with complaint of left foot pain. Patient states that she is having some improvement. Tried [employer] and did not care of them. Has not been able to wearing the orthotics as much as they cause pressure over the 1st MTP. Intermittently has twinge of pain on lateral aspect of foot. Continues to have fluid over the 1st MTP.     Past Medical History:  Diagnosis Date  . 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 Breast cancer ([place])    sees onc and gen surg   . Mitral regurgitation    mild on echo from 2009, [name] MVP per notes  . Osteoarthritis    w/ bilat hip resurfacing and possible metal reaction, sees ortho at [facility]  . Scoliosis   . Uterine fibroid   . Varicose veins    goes to [place] [facility], s/p laser and inj tx   Past Surgical History:  Procedure Laterality Date  . BREAST LUMPECTOMY  09/12   left breast  . GANGLION CYST EXCISION    . left hip resurfaced 2011    . right hip resurfaced 2008    . unterine fibroid  1998   removal   Social History   Socioeconomic History  . Marital status: Married    Spouse name: Not on file  . Number of children: Not on file  . Years of education: Not on file  . Highest education level: Not on file   Occupational History  . Occupation: Counselling psychologist  Social Needs  . Financial resource strain: Not on file  . Food insecurity    Worry: Not on file    Inability: Not on file  . Transportation needs    Medical: Not on file    Non-medical: Not on file  Tobacco Use  . Smoking status: Never Smoker  . Smokeless tobacco: Never Used  Substance and Sexual Activity  . Alcohol use: Yes    Alcohol/week: 7.0 standard drinks    Types: 7 Glasses of wine per week  . Drug use: [name]  . Sexual activity: Yes    Birth control/protection: Post-menopausal  Lifestyle  . Physical activity    Days per week: Not on file    Minutes per session: Not on file  . Stress: Not on file  Relationships  . Social Herbalist on phone: Not on file    Gets together: Not on file    Attends religious service: Not on file    Active member of club or organization: Not on file    Attends meetings of clubs or organizations: Not on file    Relationship status: Not on file  Other Topics Concern  . 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 Erythromycin Diarrhea and Nausea Only  . Latex     Sores in mouth   Family History  Problem Relation Age of Onset  . Stroke Mother 38       hemorrhagic  . Other Mother        pagets disease breast  . Arthritis Mother        foot, knee problems  . Hypertension Father   . Hyperlipidemia Father   . CAD Father 15       Triple bypass; died age 43  . Arthritis Father   . Dementia Father   . Breast cancer Maternal Aunt   . Esophageal cancer Maternal Grandfather 90  . Dementia Paternal Grandmother   . Heart disease Paternal Grandfather 35       multiple heart attacks  . Arthritis Paternal Uncle   . Colon cancer Neg Hx   . Stomach cancer Neg Hx   . Thyroid disease Neg Hx           Current Outpatient Medications (Other):  Marland Kitchen  Ascorbic Acid (VITA-C PO), Take by mouth. Marland Kitchen  BIOTIN 5000 PO, Take by mouth 2 (two) times a day.  Marland Kitchen  CALCIUM CITRATE PO, Take by mouth daily. .  Cholecalciferol (VITAMIN D3 PO), Take 4,000 Units by mouth 2 (two) times daily.  Marland Kitchen  MAGNESIUM PO, Take by mouth daily. .  Multiple Vitamin (MULTI-VITAMIN DAILY PO), Take by mouth. .  NON FORMULARY, Inflamed-herbal anti inflammatory .  NON FORMULARY, supplement for liver .  Omega-3 Fatty Acids (FISH OIL) 500 MG CAPS, Take 690 mg by mouth 2 (two) times daily.  Marland Kitchen  OVER THE COUNTER MEDICATION, wob enzym .  OVER THE COUNTER MEDICATION, Digestive enzyme .  Probiotic Product (PROBIOTIC-10 PO), Take by mouth.    Past medical history, social, surgical and family history all reviewed in electronic medical record.  [name] pertanent information unless stated regarding to the chief complaint.   Review of Systems:  [name] headache, visual changes, nausea, vomiting, diarrhea, constipation, dizziness, abdominal pain, skin rash, fevers, chills, night sweats, weight loss, swollen lymph nodes,  chest pain, shortness of breath, mood changes.  Positive muscle aches, body aches  Objective  Blood pressure 100/64, pulse 92, height 5' 5.75" (1.67 m), weight 140 lb (63.5 kg), last menstrual period 12/05/2012, SpO2 97 %.   General: [name] apparent distress alert and oriented x3 mood and affect normal, dressed appropriately.  HEENT: Pupils equal, extraocular movements intact  Respiratory: Patient's speak in full sentences and does not appear short of breath  Cardiovascular: [name] lower extremity edema, non tender, [name] erythema  Skin: Warm dry intact with [name] signs of infection or rash on extremities or on axial skeleton.  Abdomen: Soft nontender  Neuro: Cranial nerves II through XII are intact, neurovascularly intact in all extremities with 2+ DTRs and 2+ pulses.  Lymph: [name] lymphadenopathy of posterior or anterior cervical chain or  axillae bilaterally.  Gait antalgic MSK:  tender with mild limited range of motion and stability and symmetric strength and tone of shoulders, elbows, wrist, hip, knee and ankles bilaterally.  Arthritic changes of multiple joints.  Foot exam shows the patient does have severe breakdown of the transverse arch with bunion and bunionette formation.  Patient does have  hallux limitus noted bilaterally left greater than right.  Mild cyst formation on the dorsal aspect of the first metatarsal.  Limited musculoskeletal ultrasound was performed and interpreted by Lyndal Pulley  Limited ultrasound of patient's left foot shows that patient does not have a recurrent capsulitis but does have a small cyst that seems to be superficial to the extensor tendon of the fifth first metatarsal.  [name] erythema, [name] abnormal vascular flow.    Impression and Recommendations:     This case required medical decision making of moderate complexity. The above documentation has been reviewed and is accurate and complete Lyndal Pulley, DO       Note: This dictation was prepared with Dragon dictation along with smaller phrase technology. Any transcriptional errors that result from this process are unintentional.

## 2019-08-09 NOTE — Patient Instructions (Signed)
Exercises 3x a week Isabel [name] or rocker sole bottom See me in 2 months

## 2019-08-09 NOTE — Assessment & Plan Note (Addendum)
Stable, due to the breakdown of the foot I think that this is going to contribute.  Encourage patient to use the custom orthotics and we made adjustments accordingly.  We discussed other over-the-counter medications that could be beneficial as well.  Moderate to severe arthritic changes of the foot will be contributing and may need injections from time to time.  Significant hip weakness that I think is contributing as well.  Follow-up again in  8 weeks spent  25 minutes with patient face-to-face and had greater than 50% of counseling including as described above in assessment and plan.

## 2019-08-14 ENCOUNTER — Other Ambulatory Visit: Payer: Self-pay

## 2019-08-14 ENCOUNTER — Inpatient Hospital Stay: Payer: BC Managed Care – PPO | Attending: Adult Health | Admitting: Adult Health

## 2019-08-14 VITALS — BP 139/84 | HR 65 | Temp 98.0°F | Resp 18 | Ht 65.75 in | Wt 142.2 lb

## 2019-08-14 DIAGNOSIS — Z853 Personal history of malignant neoplasm of breast: Secondary | ICD-10-CM | POA: Insufficient documentation

## 2019-08-14 DIAGNOSIS — C50412 Malignant neoplasm of upper-outer quadrant of left female breast: Secondary | ICD-10-CM | POA: Diagnosis not present

## 2019-08-14 DIAGNOSIS — E041 Nontoxic single thyroid nodule: Secondary | ICD-10-CM | POA: Diagnosis not present

## 2019-08-14 DIAGNOSIS — Z803 Family history of malignant neoplasm of breast: Secondary | ICD-10-CM | POA: Diagnosis not present

## 2019-08-14 DIAGNOSIS — Z923 Personal history of irradiation: Secondary | ICD-10-CM | POA: Insufficient documentation

## 2019-08-14 DIAGNOSIS — Z170 Estrogen receptor positive status [ER+]: Secondary | ICD-10-CM | POA: Diagnosis not present

## 2019-08-14 NOTE — Progress Notes (Signed)
CLINIC:  Survivorship   REASON FOR VISIT:  Routine follow-up for history of breast cancer.   BRIEF ONCOLOGIC HISTORY:  Oncology History  Malignant neoplasm of upper-outer quadrant of left female breast ([place])  08/09/2011 Initial Diagnosis   Left breast biopsy: high-grade ductal carcinoma in situ, with comedonecrosis and calcification, ER +97%, PR +100%.   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 No lymph nodes were examined, margins were not involved.   10/13/2011 - 11/30/2011 Radiation Therapy   Adjuvant radiation therapy   12/13/2011 - 11/23/2015 Anti-estrogen oral therapy   Tamoxifen 20 mg daily discontinued due to uterine bleeding 09/02/2013, later resumed it and took it intermittently      INTERVAL HISTORY:  Isabel [name] [name] presents to the [facility] today for routine follow-up for her history of breast cancer.  Overall, she reports feeling quite well. Isabel [name] is doing well.  She sees her PCP regularly.  She continues to have vaginal dryness and struggles with this.    Since her last visit she underwent bilateral 3 d screening mammogram in 09/2018.  It showed no evidence of malignancy and breast density category C.    She has regular ultrasounds of the thyroid to monitor her thyroid nodules.  She continues to have some vaginal dryness.  She wants to know what to do about these issues   REVIEW OF SYSTEMS:  Review of Systems  Constitutional: Negative for appetite change, chills, fatigue, fever and unexpected weight change.  HENT:   Negative for hearing loss, lump/mass, sore throat, tinnitus and voice change.   Eyes: Negative for eye problems and icterus.  Respiratory: Negative for chest tightness, cough and shortness of breath.   Cardiovascular: Negative for chest pain, leg swelling and palpitations.  Gastrointestinal: Negative for abdominal distention, abdominal pain, constipation,  diarrhea, nausea and vomiting.  Endocrine: Negative for hot flashes.  Musculoskeletal: Negative for arthralgias.  Skin: Negative for itching and rash.  Neurological: Negative for dizziness, extremity weakness, headaches and numbness.  Hematological: Negative for adenopathy. Does not bruise/bleed easily.  Psychiatric/Behavioral: Negative for depression. The patient is not nervous/anxious.   Breast: Denies any new nodularity, masses, tenderness, nipple changes, or nipple discharge.       PAST MEDICAL/SURGICAL HISTORY:  Past Medical History:  Diagnosis Date  . 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 Breast cancer ([place])    sees onc and gen surg   . Mitral regurgitation    mild on echo from 2009, no MVP per notes  . Osteoarthritis    w/ bilat hip resurfacing and possible metal reaction, sees ortho at [facility]  . Scoliosis   . Uterine fibroid   . Varicose veins    goes to [place] [facility], s/p laser and inj tx   Past Surgical History:  Procedure Laterality Date  . BREAST LUMPECTOMY  09/12   left breast  . GANGLION CYST EXCISION    . left hip resurfaced 2011    . right hip resurfaced 2008    . unterine fibroid  1998   removal     ALLERGIES:  Allergies  Allergen Reactions  . Erythromycin Diarrhea and Nausea Only  . Latex     Sores in mouth     CURRENT MEDICATIONS:  Outpatient Encounter Medications as of 08/14/2019  Medication Sig  . Ascorbic Acid (VITA-C PO) Take by mouth.  Marland Kitchen BIOTIN 5000 PO Take by mouth 2 (two) times a  day.   Marland Kitchen CALCIUM CITRATE PO Take by mouth daily.  . Cholecalciferol (VITAMIN D3 PO) Take 4,000 Units by mouth 2 (two) times daily.   Marland Kitchen MAGNESIUM PO Take by mouth daily.  . Multiple Vitamin (MULTI-VITAMIN DAILY PO) Take by mouth.  . NON FORMULARY Inflamed-herbal anti inflammatory  . NON FORMULARY supplement for liver  . Omega-3 Fatty Acids (FISH OIL) 500 MG CAPS Take 690 mg by mouth 2 (two) times daily.   Marland Kitchen OVER THE COUNTER MEDICATION wob  enzym  . OVER THE COUNTER MEDICATION Digestive enzyme  . Probiotic Product (PROBIOTIC-10 PO) Take by mouth.   No facility-administered encounter medications on file as of 08/14/2019.      ONCOLOGIC FAMILY HISTORY:  Family History  Problem Relation Age of Onset  . Stroke Mother 76       hemorrhagic  . Other Mother        pagets disease breast  . Arthritis Mother        foot, knee problems  . Hypertension Father   . Hyperlipidemia Father   . CAD Father 39       Triple bypass; died age 49  . Arthritis Father   . Dementia Father   . Breast cancer Maternal Aunt   . Esophageal cancer Maternal Grandfather 90  . Dementia Paternal Grandmother   . Heart disease Paternal Grandfather 60       multiple heart attacks  . Arthritis Paternal Uncle   . Colon cancer Neg Hx   . Stomach cancer Neg Hx   . Thyroid disease Neg Hx       SOCIAL HISTORY:  Social History   Socioeconomic History  . Marital status: Married    Spouse name: Not on file  . Number of children: Not on file  . Years of education: Not on file  . Highest education level: Not on file  Occupational History  . Occupation: Counselling psychologist  Social Needs  . Financial resource strain: Not on file  . Food insecurity    Worry: Not on file    Inability: Not on file  . Transportation needs    Medical: Not on file    Non-medical: Not on file  Tobacco Use  . Smoking status: Never Smoker  . Smokeless tobacco: Never Used  Substance and Sexual Activity  . Alcohol use: Yes    Alcohol/week: 7.0 standard drinks    Types: 7 Glasses of wine per week  . Drug use: No  . Sexual activity: Yes    Birth control/protection: Post-menopausal  Lifestyle  . Physical activity    Days per week: Not on file    Minutes per session: Not on file  . Stress: Not on file  Relationships  . Social Herbalist on phone: Not on file    Gets together: Not on file    Attends religious service: Not on file    Active member of club  or organization: Not on file    Attends meetings of clubs or organizations: Not on file    Relationship status: Not on file  . Intimate partner violence    Fear of current or ex partner: Not on file    Emotionally abused: Not on file    Physically abused: Not on file    Forced sexual activity: Not on file  Other Topics Concern  . 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  Unaccompanied today.   HEENT: Head is normocephalic.  Pupils equal and reactive to light. Conjunctivae clear without exudate.  Sclerae anicteric. Oral mucosa is pink, moist.  Oropharynx is pink without lesions or erythema.  Lymph: No cervical, supraclavicular, or infraclavicular lymphadenopathy noted on palpation.  Cardiovascular: Regular rate and rhythm.Marland Kitchen Respiratory: Clear to auscultation bilaterally. Chest expansion symmetric; breathing non-labored.  Breast Exam:  -Left breast: No appreciable masses on palpation. No skin redness, thickening, or peau d'orange appearance; no nipple retraction or nipple discharge; mild distortion in symmetry at previous lumpectomy site small stable slightly firm nodule under lumpectomy scar, stable per patient. -Right breast: No appreciable masses on palpation. No skin redness, thickening, or peau d'orange appearance; no nipple retraction or nipple discharge;  -Axilla: No axillary adenopathy bilaterally.  GI: Abdomen soft and round; non-tender, non-distended. Bowel sounds normoactive. No hepatosplenomegaly.   GU: Deferred.  Neuro: No focal deficits. Steady gait.  Psych: Mood and affect normal and appropriate for situation.  MSK:  No focal spinal tenderness to palpation, full range of motion in bilateral upper extremities Extremities: No edema. Skin: Warm and dry.  LABORATORY DATA:  None for this visit   DIAGNOSTIC IMAGING:  Most recent mammogram:     ASSESSMENT AND PLAN:  Ms.. Mandala is a pleasant 63 y.o. female with history of Stage 0 left breast DCIS, ER+/PR+, diagnosed in 08/2011, treated with lumpectomy, adjuvant radiation therapy, and anti-estrogen therapy with Tamoxifen x 3 years (stopped early due to uterine bleeding).  [facility] for surveillance and routine follow-up.   1. History of breast cancer:  Ms. Imdieke is currently clinically and radiographically without evidence of disease or recurrence of breast cancer. She will be due for mammogram in 09/2019.  She will return in one year for LTS follow up.  I encouraged her to call me with any questions or concerns before her next visit at the cancer center, and I would be happy to see her sooner, if needed.    2. Thyroid nodule: I recommended that she continue to have this followed with Dr. Loanne Drilling in endocrinology.  3. Vaginal Dryness:  We reviewed Vitamin E suppositories.  I will see how these are supplied and prescribe these if possible.     4. Cancer screening:  Due to Ms. Gitlin's history and her age, she should receive screening for skin cancers, colon cancer, and gynecologic cancers. She was encouraged to follow-up with her PCP for appropriate cancer screenings.   5. Health maintenance and wellness promotion: Ms. Mahowald was encouraged to consume 5-7 servings of fruits and vegetables per day. She was also encouraged to engage in moderate to vigorous exercise for 30 minutes per day most days of the week. She was instructed to limit her alcohol consumption and continue to abstain from tobacco use.    Dispo:  -Return to cancer center in one year for LTS follow up -Mammogram in 09/2019 when due   A total of (30)  minutes of face-to-face time was spent with this patient with greater than 50% of that time in counseling and care-coordination.   Gardenia Phlegm, NP Surviv[facility]  [place] [phone]   Note: PRIMARY CARE PROVIDER Caren Macadam, [place] [phone]

## 2019-08-15 ENCOUNTER — Telehealth: Payer: Self-pay | Admitting: Adult Health

## 2019-08-15 ENCOUNTER — Encounter: Payer: Self-pay | Admitting: Adult Health

## 2019-08-15 NOTE — Telephone Encounter (Signed)
I left a message regarding schedule  

## 2019-10-10 ENCOUNTER — Encounter: Payer: Self-pay | Admitting: Family Medicine

## 2019-10-10 DIAGNOSIS — Z1231 Encounter for screening mammogram for malignant neoplasm of breast: Secondary | ICD-10-CM | POA: Diagnosis not present

## 2019-10-10 DIAGNOSIS — Z853 Personal history of malignant neoplasm of breast: Secondary | ICD-10-CM | POA: Diagnosis not present

## 2019-10-10 NOTE — Progress Notes (Signed)
[name] [address], [place] [address] Phone: [phone] Subjective:   Fontaine No, am serving as a scribe for Dr. Hulan Saas.  I'm seeing this patient by the request  of:    CC: Foot pain follow-up  [id number]   08/09/2019 Stable, due to the breakdown of the foot I think that this is going to contribute.  Encourage patient to use the custom orthotics and we made adjustments accordingly.  We discussed other over-the-counter medications that could be beneficial as well.  Moderate to severe arthritic changes of the foot will be contributing and may need injections from time to time.  Significant hip weakness that I think is contributing as well.  Follow-up again in  8 weeks spent  25 minutes with patient face-to-face and had greater than 50% of counseling including as described above in assessment and plan.  Update 10/11/2019 Isabel [name] is a 63 y.o. female coming in with complaint of right foot pain. Patient states that her foot pain is better than last visit. Hips bother her more than her feet. Did get some Xererlo shoes.  Has been doing relatively well.  Not having as much significant discomfort and pain.  Patient states feels like the swelling in her first toe has improved significantly as well    Past Medical History:  Diagnosis Date  . 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 Breast cancer ([place])    sees onc and gen surg   . Mitral regurgitation    mild on echo from 2009, no MVP per notes  . Osteoarthritis    w/ bilat hip resurfacing and possible metal reaction, sees ortho at [facility]  . Scoliosis   . Uterine fibroid   . Varicose veins    goes to [place] [facility], s/p laser and inj tx   Past Surgical History:  Procedure Laterality Date  . BREAST LUMPECTOMY  09/12   left breast  . GANGLION CYST EXCISION    . left hip resurfaced 2011    . right hip resurfaced 2008    . unterine fibroid  1998   removal    Social History   Socioeconomic History  . Marital status: Married    Spouse name: Not on file  . Number of children: Not on file  . Years of education: Not on file  . Highest education level: Not on file  Occupational History  . Occupation: Counselling psychologist  Social Needs  . Financial resource strain: Not on file  . Food insecurity    Worry: Not on file    Inability: Not on file  . Transportation needs    Medical: Not on file    Non-medical: Not on file  Tobacco Use  . Smoking status: Never Smoker  . Smokeless tobacco: Never Used  Substance and Sexual Activity  . Alcohol use: Yes    Alcohol/week: 7.0 standard drinks    Types: 7 Glasses of wine per week  . Drug use: No  . Sexual activity: Yes    Birth control/protection: Post-menopausal  Lifestyle  . Physical activity    Days per week: Not on file    Minutes per session: Not on file  . Stress: Not on file  Relationships  . Social Herbalist on phone: Not on file    Gets together: Not on file    Attends religious service: Not on file    Active member of club or  organization: Not on file    Attends meetings of clubs or organizations: Not on file    Relationship status: Not on file  Other Topics Concern  . 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 Erythromycin Diarrhea and Nausea Only  . Latex     Sores in mouth   Family History  Problem Relation Age of Onset  . Stroke Mother 81       hemorrhagic  . Other Mother        pagets disease breast  . Arthritis Mother        foot, knee problems  . Hypertension Father   . Hyperlipidemia Father   . CAD Father 65       Triple bypass; died age 49  . Arthritis Father   . Dementia Father   . Breast cancer Maternal Aunt   . Esophageal cancer Maternal Grandfather 90  .  Dementia Paternal Grandmother   . Heart disease Paternal Grandfather 52       multiple heart attacks  . Arthritis Paternal Uncle   . Colon cancer Neg Hx   . Stomach cancer Neg Hx   . Thyroid disease Neg Hx          Current Outpatient Medications (Other):  Marland Kitchen  Ascorbic Acid (VITA-C PO), Take by mouth. Marland Kitchen  BIOTIN 5000 PO, Take by mouth 2 (two) times a day.  Marland Kitchen  CALCIUM CITRATE PO, Take by mouth daily. .  Cholecalciferol (VITAMIN D3 PO), Take 4,000 Units by mouth 2 (two) times daily.  Marland Kitchen  MAGNESIUM PO, Take by mouth daily. .  Multiple Vitamin (MULTI-VITAMIN DAILY PO), Take by mouth. .  NON FORMULARY, Inflamed-herbal anti inflammatory .  NON FORMULARY, supplement for liver .  Omega-3 Fatty Acids (FISH OIL) 500 MG CAPS, Take 690 mg by mouth 2 (two) times daily.  Marland Kitchen  OVER THE COUNTER MEDICATION, wob enzym .  OVER THE COUNTER MEDICATION, Digestive enzyme .  Probiotic Product (PROBIOTIC-10 PO), Take by mouth.    Past medical history, social, surgical and family history all reviewed in electronic medical record.  No pertanent information unless stated regarding to the chief complaint.   Review of Systems:  No headache, visual changes, nausea, vomiting, diarrhea, constipation, dizziness, abdominal pain, skin rash, fevers, chills, night sweats, weight loss, swollen lymph nodes, body aches, joint swelling, muscle aches, chest pain, shortness of breath, mood changes.   Objective  Blood pressure 100/72, pulse 79, height 5' 5.75" (1.67 m), weight 142 lb (64.4 kg), last menstrual period 12/05/2012, SpO2 98 %.    General: No apparent distress alert and oriented x3 mood and affect normal, dressed appropriately.  HEENT: Pupils equal, extraocular movements intact  Respiratory: Patient's speak in full sentences and does not appear short of breath  Cardiovascular: No lower extremity edema, non tender, no erythema  Skin: Warm dry intact with no signs of infection or rash on extremities or on axial  skeleton.  Abdomen: Soft nontender  Neuro: Cranial nerves II through XII are intact, neurovascularly intact in all extremities with 2+ DTRs and 2+ pulses.  Lymph: No lymphadenopathy of posterior or anterior cervical chain or axillae bilaterally.  Gait severe weakness noted with Trendelenburg and antalgic gait MSK:  Non tender with full range of motion and good  stability and symmetric strength and tone of shoulders, elbows, wrist, , knee and ankles bilaterally.  Hip replacement noted with weakness  Foot exam shows the patient does have severe breakdown of the transverse arch with large bunion noted bilaterally right greater than left.  Some mild swelling though it effusion of the first MTP on the left.  Nontender on exam though.  Neurovascular intact distally.    Impression and Recommendations:      The above documentation has been reviewed and is accurate and complete Lyndal Pulley, DO       Note: This dictation was prepared with Dragon dictation along with smaller phrase technology. Any transcriptional errors that result from this process are unintentional.

## 2019-10-11 ENCOUNTER — Ambulatory Visit (INDEPENDENT_AMBULATORY_CARE_PROVIDER_SITE_OTHER): Payer: BC Managed Care – PPO | Admitting: Family Medicine

## 2019-10-11 ENCOUNTER — Encounter: Payer: Self-pay | Admitting: Family Medicine

## 2019-10-11 ENCOUNTER — Other Ambulatory Visit: Payer: Self-pay

## 2019-10-11 DIAGNOSIS — M19079 Primary osteoarthritis, unspecified ankle and foot: Secondary | ICD-10-CM

## 2019-10-11 NOTE — Patient Instructions (Signed)
Good to see you  The feet look good.  Continue what you are doing Try taping the wrist if it gives you trouble but I do not believe it will be chronic  See me again in 8 weeks

## 2019-10-11 NOTE — Assessment & Plan Note (Signed)
Stable overall.  Discussed with patient about posture and ergonomics.  Discussed which activities to do which wants to avoid.  Patient is to increase activity as tolerated.  As long as patient does well follow-up as needed

## 2019-10-18 ENCOUNTER — Encounter: Payer: Self-pay | Admitting: Family Medicine

## 2019-10-18 DIAGNOSIS — R921 Mammographic calcification found on diagnostic imaging of breast: Secondary | ICD-10-CM | POA: Diagnosis not present

## 2019-10-18 DIAGNOSIS — R928 Other abnormal and inconclusive findings on diagnostic imaging of breast: Secondary | ICD-10-CM | POA: Diagnosis not present

## 2019-10-22 ENCOUNTER — Telehealth: Payer: Self-pay | Admitting: *Deleted

## 2019-10-22 NOTE — Telephone Encounter (Signed)
Copied from [place] [id number]. Topic: General - Other >> Oct 22, 2019  4:29 PM Rainey Pines A wrote: Patient returning [place] call and is requesting a callback

## 2019-10-22 NOTE — Telephone Encounter (Signed)
Left a message for the pt to return a call to the office.  CRM also created. 

## 2019-10-22 NOTE — Telephone Encounter (Signed)
Pt called back to inform dr Raliegh Ip that the additional imaging was done and that she has scheduled an appt for the needle biospy.

## 2019-10-22 NOTE — Telephone Encounter (Signed)
Dr Ethlyn Gallery received a fax from [facility] stating the pt needs additional imaging for the screening mammogram.  I left a message for the pt to return my call as Dr Ethlyn Gallery wanted to ensure the additional imaging was ordered and the pt has set up an appt or returned for these.

## 2019-10-23 NOTE — Telephone Encounter (Signed)
Noted. Thank you for checking in.

## 2019-10-29 ENCOUNTER — Encounter: Payer: Self-pay | Admitting: Family Medicine

## 2019-10-29 ENCOUNTER — Other Ambulatory Visit: Payer: Self-pay | Admitting: Radiology

## 2019-10-29 DIAGNOSIS — N6032 Fibrosclerosis of left breast: Secondary | ICD-10-CM | POA: Diagnosis not present

## 2019-10-29 DIAGNOSIS — R921 Mammographic calcification found on diagnostic imaging of breast: Secondary | ICD-10-CM | POA: Diagnosis not present

## 2019-11-12 ENCOUNTER — Other Ambulatory Visit: Payer: Self-pay

## 2019-11-12 DIAGNOSIS — Z20822 Contact with and (suspected) exposure to covid-19: Secondary | ICD-10-CM

## 2019-11-14 LAB — NOVEL CORONAVIRUS, NAA: SARS-CoV-2, NAA: NOT DETECTED

## 2019-12-09 ENCOUNTER — Ambulatory Visit: Payer: BC Managed Care – PPO | Admitting: Family Medicine

## 2019-12-30 DIAGNOSIS — L570 Actinic keratosis: Secondary | ICD-10-CM | POA: Diagnosis not present

## 2019-12-30 DIAGNOSIS — L309 Dermatitis, unspecified: Secondary | ICD-10-CM | POA: Diagnosis not present

## 2019-12-30 MED FILL — HYDROCORTISONE 2.5% CREAM: 2.5 | 14 days supply | Qty: 60 | Fill #0

## 2020-05-28 ENCOUNTER — Other Ambulatory Visit: Payer: Self-pay

## 2020-05-29 ENCOUNTER — Ambulatory Visit (INDEPENDENT_AMBULATORY_CARE_PROVIDER_SITE_OTHER): Payer: BC Managed Care – PPO | Admitting: Family Medicine

## 2020-05-29 ENCOUNTER — Encounter: Payer: Self-pay | Admitting: Family Medicine

## 2020-05-29 VITALS — BP 100/78 | HR 76 | Temp 97.7°F | Ht 65.75 in | Wt 145.0 lb

## 2020-05-29 DIAGNOSIS — B351 Tinea unguium: Secondary | ICD-10-CM | POA: Diagnosis not present

## 2020-05-29 DIAGNOSIS — L821 Other seborrheic keratosis: Secondary | ICD-10-CM | POA: Diagnosis not present

## 2020-05-29 DIAGNOSIS — H11432 Conjunctival hyperemia, left eye: Secondary | ICD-10-CM

## 2020-05-29 MED ORDER — POLYMYXIN B-TRIMETHOPRIM 10000-0.1 UNIT/ML-% OP SOLN: 2.0000 [drp] | OPHTHALMIC | 0 refills | Status: DC

## 2020-05-29 NOTE — Progress Notes (Signed)
Carigan Lister Wasilewski DOB: March 13, 1956 Encounter date: 05/29/2020.   This is a 64 y.o. female who presents with No chief complaint on file.   History of present illness: Left eye redenss - started 2-3 days ago. Nothing that she knows of Kind of sore. Has annual eye appointment in 2 weeks. Does wear contacts. Started using some of the refresh eye drops which made it feel a little better. No drainage, no crusting. No other sick symtpoms. No vision changes.   In last year noticed getting more bumps on neck, chest, arm pit sbck.   Thickening started on mid toe, but has spread to other toenails.  Not painful, but appearance bothers her.  Does make trimming toenails more difficult.  Used topical antifungal, but did not get any benefit with this.    Allergies  Allergen Reactions  . Erythromycin Diarrhea and Nausea Only  . Latex     Sores in mouth   No outpatient medications have been marked as taking for the 05/29/20 encounter (Appointment) with Caren Macadam, MD.    Review of Systems  Constitutional: Negative for chills, fatigue and fever.  Respiratory: Negative for cough, chest tightness, shortness of breath and wheezing.   Cardiovascular: Negative for chest pain, palpitations and leg swelling.  Skin: Positive for rash.    Objective:  LMP 12/05/2012       BP Readings from Last 3 Encounters:  10/11/19 100/72  08/14/19 139/84  08/09/19 100/64   Wt Readings from Last 3 Encounters:  10/11/19 142 lb (64.4 kg)  08/14/19 142 lb 3.2 oz (64.5 kg)  08/09/19 140 lb (63.5 kg)    Physical Exam Vitals reviewed.  Constitutional:      Appearance: Normal appearance.  Eyes:     General: Lids are normal. Gaze aligned appropriately.        Right eye: No foreign body, discharge or hordeolum.        Left eye: No foreign body, discharge or hordeolum.     Extraocular Movements: Extraocular movements intact.     Conjunctiva/sclera:     Right eye: Right conjunctiva is not injected. No  chemosis, exudate or hemorrhage.    Left eye: Left conjunctiva is injected. No chemosis, exudate or hemorrhage.    Pupils: Pupils are equal, round, and reactive to light.     Comments: No tenderness to the globe of the eye, no edema or tenderness with palpation around the eye.  There is some conjunctival injection laterally to the eye.  No obvious lesion or lid lesion.  Eye movement, blinking are not painful to the patient.  Skin:    General: Skin is warm.     Capillary Refill: Capillary refill takes less than 2 seconds.     Comments: Scattered seborrheic keratoses, most are small (less than half centimeter) over trunk.  She does have skin tags in axillary region and bra line.  Seborrheic's over chest as well.  Neurological:     Mental Status: She is alert.     Assessment/Plan  1. Toenail fungus We clipped toenails today and will send for culture.  We discussed risks of antifungal treatment and would like to be sure that it is fungus causing toenail abnormality before treating.  Follow-up pending results.  We did discuss risk of recurrence even after treatment. - Culture, Fungus with Smear  2. Seborrheic keratosis We discussed that these are a cosmetic issue and not a risk for skin cancer.  She does have a dermatology appointment coming up and  can have them looked at with dermatology.  3. Conjunctival injection, left I advised patient not to wear contacts as this could further irritate the eye.  I advised flushing the eye with saline solution at home.  She does have a follow-up with her eye doctor in the next 2 weeks.  I advised if any visual changes or pain, she needs to seek evaluation.  I did prescribe take in case of increased erythema or drainage over the weekend.   Return if symptoms worsen or fail to improve.    Micheline Rough, MD

## 2020-05-30 ENCOUNTER — Encounter: Payer: Self-pay | Admitting: Family Medicine

## 2020-06-01 LAB — TIQ-NTM

## 2020-06-01 NOTE — Telephone Encounter (Signed)
Culture for her was toenail culture - looks like it is put in as skin. Can you check with lab and see what I need to sign for culture to be completed? I didn't want to sign if that is not the order they need. Thanks!

## 2020-06-01 NOTE — Telephone Encounter (Signed)
I called [facility] at [phone] and spoke with Isabel [name] to inform her of the message below.  Isabel [name] stated she will update the specimen source to avoid any further delays.  Message sent to PCP.

## 2020-06-03 ENCOUNTER — Encounter: Payer: Self-pay | Admitting: Family Medicine

## 2020-07-01 LAB — TEST AUTHORIZATION

## 2020-07-01 LAB — FUNGUS CULTURE W SMEAR

## 2020-07-01 LAB — CULTURE, FUNGUS WITHOUT SMEAR
CULTURE:: NO GROWTH
MICRO NUMBER:: 10641617
SPECIMEN QUALITY:: ADEQUATE

## 2020-07-02 ENCOUNTER — Encounter: Payer: Self-pay | Admitting: Family Medicine

## 2020-07-17 ENCOUNTER — Telehealth: Payer: Self-pay | Admitting: *Deleted

## 2020-07-17 ENCOUNTER — Encounter: Payer: Self-pay | Admitting: Family Medicine

## 2020-07-17 ENCOUNTER — Ambulatory Visit (INDEPENDENT_AMBULATORY_CARE_PROVIDER_SITE_OTHER): Payer: BC Managed Care – PPO | Admitting: Family Medicine

## 2020-07-17 ENCOUNTER — Other Ambulatory Visit: Payer: Self-pay

## 2020-07-17 VITALS — BP 102/80 | HR 56 | Temp 98.2°F | Ht 67.0 in | Wt 142.6 lb

## 2020-07-17 DIAGNOSIS — E538 Deficiency of other specified B group vitamins: Secondary | ICD-10-CM | POA: Diagnosis not present

## 2020-07-17 DIAGNOSIS — E559 Vitamin D deficiency, unspecified: Secondary | ICD-10-CM

## 2020-07-17 DIAGNOSIS — R5383 Other fatigue: Secondary | ICD-10-CM

## 2020-07-17 DIAGNOSIS — E042 Nontoxic multinodular goiter: Secondary | ICD-10-CM

## 2020-07-17 DIAGNOSIS — R7301 Impaired fasting glucose: Secondary | ICD-10-CM | POA: Diagnosis not present

## 2020-07-17 DIAGNOSIS — Z13220 Encounter for screening for lipoid disorders: Secondary | ICD-10-CM | POA: Diagnosis not present

## 2020-07-17 DIAGNOSIS — Z862 Personal history of diseases of the blood and blood-forming organs and certain disorders involving the immune mechanism: Secondary | ICD-10-CM

## 2020-07-17 DIAGNOSIS — Z0000 Encounter for general adult medical examination without abnormal findings: Secondary | ICD-10-CM

## 2020-07-17 DIAGNOSIS — L989 Disorder of the skin and subcutaneous tissue, unspecified: Secondary | ICD-10-CM | POA: Diagnosis not present

## 2020-07-17 NOTE — Patient Instructions (Addendum)
Consider shingrix vaccine and you can schedule nurse visit for this when able.  I will let you know about what podiatrist says regarding toenails.  Zoster Vaccine, Recombinant injection What is this medicine? ZOSTER VACCINE (ZOS ter vak SEEN) is used to prevent shingles in adults 62 years old and over. This vaccine is not used to treat shingles or nerve pain from shingles. This medicine may be used for other purposes; ask your health care provider or pharmacist if you have questions. COMMON BRAND NAME(S): [facility] What should I tell my health care provider before I take this medicine? They need to know if you have any of these conditions:  blood disorders or disease  cancer like leukemia or lymphoma  immune system problems or therapy  an unusual or allergic reaction to vaccines, other medications, foods, dyes, or preservatives  pregnant or trying to get pregnant  breast-feeding How should I use this medicine? This vaccine is for injection in a muscle. It is given by a health care professional. Talk to your pediatrician regarding the use of this medicine in children. This medicine is not approved for use in children. Overdosage: If you think you have taken too much of this medicine contact a poison control center or emergency room at once. NOTE: This medicine is only for you. Do not share this medicine with others. What if I miss a dose? Keep appointments for follow-up (booster) doses as directed. It is important not to miss your dose. Call your doctor or health care professional if you are unable to keep an appointment. What may interact with this medicine?  medicines that suppress your immune system  medicines to treat cancer  steroid medicines like prednisone or cortisone This list may not describe all possible interactions. Give your health care provider a list of all the medicines, herbs, non-prescription drugs, or dietary supplements you use. Also tell them if you smoke,  drink alcohol, or use illegal drugs. Some items may interact with your medicine. What should I watch for while using this medicine? Visit your doctor for regular check ups. This vaccine, like all vaccines, may not fully protect everyone. What side effects may I notice from receiving this medicine? Side effects that you should report to your doctor or health care professional as soon as possible:  allergic reactions like skin rash, itching or hives, swelling of the face, lips, or tongue  breathing problems Side effects that usually do not require medical attention (report these to your doctor or health care professional if they continue or are bothersome):  chills  headache  fever  nausea, vomiting  redness, warmth, pain, swelling or itching at site where injected  tiredness This list may not describe all possible side effects. Call your doctor for medical advice about side effects. You may report side effects to FDA at 1-800-FDA-1088. Where should I keep my medicine? This vaccine is only given in a clinic, pharmacy, doctor's office, or other health care setting and will not be stored at home. NOTE: This sheet is a summary. It may not cover all possible information. If you have questions about this medicine, talk to your doctor, pharmacist, or health care provider.  2020 Elsevier/Gold Standard (2017-07-03 13:20:30)

## 2020-07-17 NOTE — Telephone Encounter (Signed)
-----   Message from Caren Macadam, MD sent at 07/17/2020  9:47 AM EDT ----- [facility] does do laser treatment for toenail fungus if she is interested in referral to that group. I told her I would look into this for her.

## 2020-07-17 NOTE — Telephone Encounter (Signed)
Mychart message sent with the information below.

## 2020-07-17 NOTE — Progress Notes (Signed)
Isabel [name] DOB: [birth year] Encounter date: 07/17/2020  This is a 64 y.o. female who presents for complete physical   History of present illness/Additional concerns: Toenails are still thickened. Wondering about next step.   Following with PT for arthritis. Not taking any medication. Ran out of supplement, but hasn't seen difference. Occasional flare up in wrist. Has thought about seeing sports med again for the ganglion cysts in her wrists bilat. Doesn't want to have surgical removal at this point.   Has a couple of spots she wanted to see Dr. Ubaldo Glassing for. In area of bra - tend to rub there and can irritate her. Not getting bigger but can scratch/or irritate it.   Colonoscopy due 09/2022  Past Medical History:  Diagnosis Date  . 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 Breast cancer ([place])    sees onc and gen surg   . Mitral regurgitation    mild on echo from 2009, no MVP per notes  . Osteoarthritis    w/ bilat hip resurfacing and possible metal reaction, sees ortho at [facility]  . Scoliosis   . Uterine fibroid   . Varicose veins    goes to [place] [facility], s/p laser and inj tx   Past Surgical History:  Procedure Laterality Date  . BREAST LUMPECTOMY  09/12   left breast  . GANGLION CYST EXCISION    . left hip resurfaced 2011    . right hip resurfaced 2008    . unterine fibroid  1998   removal   Allergies  Allergen Reactions  . Erythromycin Diarrhea and Nausea Only  . Latex     Sores in mouth   Current Meds  Medication Sig  . Ascorbic Acid (VITA-C PO) Take by mouth.  Marland Kitchen BIOTIN 5000 PO Take by mouth 2 (two) times a day.   Marland Kitchen CALCIUM CITRATE PO Take by mouth daily.  . Cholecalciferol (VITAMIN D3 PO) Take 4,000 Units by mouth 2 (two) times daily.   Marland Kitchen MAGNESIUM PO Take by mouth daily.  . Multiple Vitamin (MULTI-VITAMIN DAILY PO) Take by mouth.  . NON FORMULARY Inflamed-herbal anti inflammatory  . NON FORMULARY supplement for liver  . Omega-3 Fatty Acids  (FISH OIL) 500 MG CAPS Take 690 mg by mouth 2 (two) times daily.   Marland Kitchen OVER THE COUNTER MEDICATION Digestive enzyme  . Probiotic Product (PROBIOTIC-10 PO) Take by mouth.  . trimethoprim-polymyxin b (POLYTRIM) ophthalmic solution Place 2 drops into the left eye every 4 (four) hours.   Social History   Tobacco Use  . Smoking status: Never Smoker  . Smokeless tobacco: Never Used  Substance Use Topics  . Alcohol use: Yes    Alcohol/week: 7.0 standard drinks    Types: 7 Glasses of wine per week   Family History  Problem Relation Age of Onset  . Stroke Mother 71       hemorrhagic  . Other Mother        pagets disease breast  . Arthritis Mother        foot, knee problems  . Hypertension Father   . Hyperlipidemia Father   . CAD Father 85       Triple bypass; died age 49  . Arthritis Father   . Dementia Father   . Breast cancer Maternal Aunt   . Esophageal cancer Maternal Grandfather 90  . Dementia Paternal Grandmother   . Heart disease Paternal Grandfather 68       multiple heart attacks  .  Arthritis Paternal Uncle   . Colon cancer Neg Hx   . Stomach cancer Neg Hx   . Thyroid disease Neg Hx      Review of Systems  Constitutional: Negative for activity change, appetite change, chills, fatigue, fever and unexpected weight change.  HENT: Negative for congestion, ear pain, hearing loss, sinus pressure, sinus pain, sore throat and trouble swallowing.   Eyes: Negative for pain and visual disturbance.  Respiratory: Negative for cough, chest tightness, shortness of breath and wheezing.   Cardiovascular: Negative for chest pain, palpitations and leg swelling.  Gastrointestinal: Negative for abdominal pain, blood in stool, constipation, diarrhea, nausea and vomiting.  Genitourinary: Negative for difficulty urinating and menstrual problem.  Musculoskeletal: Negative for arthralgias and back pain.  Skin: Negative for rash.  Neurological: Negative for dizziness, weakness, numbness and  headaches.  Hematological: Negative for adenopathy. Does not bruise/bleed easily.  Psychiatric/Behavioral: Negative for sleep disturbance and suicidal ideas. The patient is not nervous/anxious.     CBC:  Lab Results  Component Value Date   WBC 4.6 07/10/2019   HGB 13.9 07/10/2019   HGB 13.3 05/29/2014   HCT 41.3 07/10/2019   HCT 39.9 05/29/2014   MCH 30.2 05/29/2014   MCH 30.6 10/21/2011   MCHC 33.7 07/10/2019   RDW 12.5 07/10/2019   RDW 12.9 05/29/2014   PLT 212.0 07/10/2019   PLT 178 05/29/2014   CMP: Lab Results  Component Value Date   NA 140 07/10/2019   NA 140 05/29/2014   K 4.5 07/10/2019   K 4.3 05/29/2014   CL 103 07/10/2019   CL 105 03/01/2013   CO2 30 07/10/2019   CO2 27 05/29/2014   ANIONGAP 8 05/29/2014   GLUCOSE 89 07/10/2019   GLUCOSE 97 05/29/2014   GLUCOSE 101 (H) 03/01/2013   BUN 14 07/10/2019   BUN 15.3 05/29/2014   CREATININE 0.55 07/10/2019   CREATININE 0.8 05/29/2014   GFRAA >90 10/21/2011   CALCIUM 9.8 07/10/2019   CALCIUM 9.1 05/29/2014   PROT 6.7 07/10/2019   PROT 6.5 05/29/2014   BILITOT 0.5 07/10/2019   BILITOT 0.47 05/29/2014   ALKPHOS 60 07/10/2019   ALKPHOS 51 05/29/2014   ALT 16 07/10/2019   ALT 25 05/29/2014   AST 20 07/10/2019   AST 21 05/29/2014   LIPID: Lab Results  Component Value Date   CHOL 172 07/10/2019   TRIG 82.0 07/10/2019   HDL 84.20 07/10/2019   LDLCALC 72 07/10/2019    Objective:  BP 102/80 (BP Location: Left Arm, Patient Position: Sitting, Cuff Size: Normal)   Pulse (!) 56   Temp 98.2 F (36.8 C) (Oral)   Ht 5\' 7"  (1.702 m)   Wt 142 lb 9.6 oz (64.7 kg)   LMP 12/05/2012   BMI 22.33 kg/m   Weight: 142 lb 9.6 oz (64.7 kg)   BP Readings from Last 3 Encounters:  07/17/20 102/80  05/29/20 100/78  10/11/19 100/72   Wt Readings from Last 3 Encounters:  07/17/20 142 lb 9.6 oz (64.7 kg)  05/29/20 145 lb (65.8 kg)  10/11/19 142 lb (64.4 kg)    Physical Exam Constitutional:      General: She is  not in acute distress.    Appearance: She is well-developed.  HENT:     Head: Normocephalic and atraumatic.     Right Ear: External ear normal.     Left Ear: External ear normal.     Mouth/Throat:     Pharynx: No oropharyngeal exudate.  Eyes:  Conjunctiva/sclera: Conjunctivae normal.     Pupils: Pupils are equal, round, and reactive to light.  Neck:     Thyroid: No thyromegaly.  Cardiovascular:     Rate and Rhythm: Normal rate and regular rhythm.     Heart sounds: Normal heart sounds. No murmur heard.  No friction rub. No gallop.   Pulmonary:     Effort: Pulmonary effort is normal.     Breath sounds: Normal breath sounds.  Abdominal:     General: Bowel sounds are normal. There is no distension.     Palpations: Abdomen is soft. There is no mass.     Tenderness: There is no abdominal tenderness. There is no guarding.     Hernia: No hernia is present.  Musculoskeletal:        General: No tenderness or deformity. Normal range of motion.     Cervical back: Normal range of motion and neck supple.  Lymphadenopathy:     Cervical: No cervical adenopathy.  Skin:    General: Skin is warm and dry.     Findings: No rash.     Comments: She does have varicosities both lower extremities, worse on the left lower extremity.  She does get slight discomfort with this, but states she wishes to wait until fall to follow-up with vascular surgeon since she would be unable to tolerate compression stockings in her current unconditioned workplace.  Multiple seborrheic keratoses on back.  One on the left upper back near scapula is irritated.  Ganglion cyst noted both wrists.  Neurological:     Mental Status: She is alert and oriented to person, place, and time.     Deep Tendon Reflexes: Reflexes normal.     Reflex Scores:      Tricep reflexes are 2+ on the right side and 2+ on the left side.      Bicep reflexes are 2+ on the right side and 2+ on the left side.      Brachioradialis reflexes are 2+  on the right side and 2+ on the left side.      Patellar reflexes are 2+ on the right side and 2+ on the left side. Psychiatric:        Speech: Speech normal.        Behavior: Behavior normal.        Thought Content: Thought content normal.     Assessment/Plan: Health Maintenance Due  Topic Date Due  . INFLUENZA VACCINE  07/05/2020   Health Maintenance reviewed.  1. Preventative health care Keep up with regular exercise and healthy eating.  We discussed that since sugar has been slightly elevated in the past, she may need to follow a low-carb diet in order to work on her weight loss which she desires.  2. Multinodular goiter She is due for repeat ultrasound to follow-up on thyroid nodules.  This was ordered today. - US THYROID; Future  3. Elevated fasting glucose See above.  She really eats very healthy, but may need to look at fruits/vegetables that could be contributing to carbohydrate intake. - Hemoglobin A1c; Future  4. Skin lesions She has seen dermatology in the past, but wishes to follow with Dr. Ubaldo Glassing.  Referral placed. - Ambulatory referral to Dermatology  5. History of anemia - CBC with Differential/Platelet; Future - Iron, TIBC and Ferritin Panel; Future  6. Vitamin D deficiency - VITAMIN D 25 Hydroxy (Vit-D Deficiency, Fractures); Future  7. B12 deficiency - Vitamin B12; Future  8. Fatigue, unspecified type -  Comprehensive metabolic panel; Future - TSH; Future  9. Lipid screening - Lipid panel; Future   Return in about 1 year (around 07/17/2021) for physical exam.  Micheline Rough, MD

## 2020-07-18 LAB — COMPREHENSIVE METABOLIC PANEL
AG Ratio: 2.5 (calc) (ref 1.0–2.5)
ALT: 16 U/L (ref 6–29)
AST: 19 U/L (ref 10–35)
Albumin: 4.7 g/dL (ref 3.6–5.1)
Alkaline phosphatase (APISO): 60 U/L (ref 37–153)
BUN: 12 mg/dL (ref 7–25)
CO2: 28 mmol/L (ref 20–32)
Calcium: 9.9 mg/dL (ref 8.6–10.4)
Chloride: 105 mmol/L (ref 98–110)
Creat: 0.6 mg/dL (ref 0.50–0.99)
Globulin: 1.9 g/dL (calc) (ref 1.9–3.7)
Glucose, Bld: 92 mg/dL (ref 65–99)
Potassium: 4.9 mmol/L (ref 3.5–5.3)
Sodium: 140 mmol/L (ref 135–146)
Total Bilirubin: 0.6 mg/dL (ref 0.2–1.2)
Total Protein: 6.6 g/dL (ref 6.1–8.1)

## 2020-07-18 LAB — CBC WITH DIFFERENTIAL/PLATELET
Absolute Monocytes: 453 cells/uL (ref 200–950)
Basophils Absolute: 40 cells/uL (ref 0–200)
Basophils Relative: 0.9 %
Eosinophils Absolute: 92 cells/uL (ref 15–500)
Eosinophils Relative: 2.1 %
HCT: 41.1 % (ref 35.0–45.0)
Hemoglobin: 13.4 g/dL (ref 11.7–15.5)
Lymphs Abs: 1465 cells/uL (ref 850–3900)
MCH: 28.8 pg (ref 27.0–33.0)
MCHC: 32.6 g/dL (ref 32.0–36.0)
MCV: 88.4 fL (ref 80.0–100.0)
MPV: 10.9 fL (ref 7.5–12.5)
Monocytes Relative: 10.3 %
Neutro Abs: 2350 cells/uL (ref 1500–7800)
Neutrophils Relative %: 53.4 %
Platelets: 248 10*3/uL (ref 140–400)
RBC: 4.65 10*6/uL (ref 3.80–5.10)
RDW: 12.7 % (ref 11.0–15.0)
Total Lymphocyte: 33.3 %
WBC: 4.4 10*3/uL (ref 3.8–10.8)

## 2020-07-18 LAB — LIPID PANEL
Cholesterol: 171 mg/dL (ref <200)
HDL: 78 mg/dL (ref >=50)
LDL Cholesterol (Calc): 77 mg/dL (calc)
Non-HDL Cholesterol (Calc): 93 mg/dL (calc) (ref <130)
Total CHOL/HDL Ratio: 2.2 (calc) (ref <5.0)
Triglycerides: 77 mg/dL (ref <150)

## 2020-07-18 LAB — VITAMIN D 25 HYDROXY (VIT D DEFICIENCY, FRACTURES): Vit D, 25-Hydroxy: 63 ng/mL (ref 30–100)

## 2020-07-18 LAB — VITAMIN B12: Vitamin B-12: 884 pg/mL (ref 200–1100)

## 2020-07-18 LAB — TSH: TSH: 0.85 mIU/L (ref 0.40–4.50)

## 2020-07-18 LAB — HEMOGLOBIN A1C
Hgb A1c MFr Bld: 5.5 % of total Hgb (ref <5.7)
Mean Plasma Glucose: 111 (calc)
eAG (mmol/L): 6.2 (calc)

## 2020-07-18 LAB — IRON,TIBC AND FERRITIN PANEL
%SAT: 33 % (calc) (ref 16–45)
Ferritin: 39 ng/mL (ref 16–288)
Iron: 114 ug/dL (ref 45–160)
TIBC: 349 mcg/dL (calc) (ref 250–450)

## 2020-07-27 ENCOUNTER — Encounter: Payer: Self-pay | Admitting: Family Medicine

## 2020-08-03 ENCOUNTER — Ambulatory Visit
Admission: RE | Admit: 2020-08-03 | Discharge: 2020-08-03 | Disposition: A | Discharge status: Home / Self Care | Payer: BC Managed Care – PPO | Source: Ambulatory Visit | Attending: Family Medicine | Admitting: Family Medicine

## 2020-08-03 ENCOUNTER — Other Ambulatory Visit: Payer: BC Managed Care – PPO

## 2020-08-03 DIAGNOSIS — E041 Nontoxic single thyroid nodule: Secondary | ICD-10-CM | POA: Diagnosis not present

## 2020-08-03 DIAGNOSIS — I8312 Varicose veins of left lower extremity with inflammation: Secondary | ICD-10-CM | POA: Diagnosis not present

## 2020-08-03 DIAGNOSIS — I83813 Varicose veins of bilateral lower extremities with pain: Secondary | ICD-10-CM | POA: Diagnosis not present

## 2020-08-03 DIAGNOSIS — I8311 Varicose veins of right lower extremity with inflammation: Secondary | ICD-10-CM | POA: Diagnosis not present

## 2020-08-03 DIAGNOSIS — E042 Nontoxic multinodular goiter: Secondary | ICD-10-CM

## 2020-08-04 ENCOUNTER — Telehealth: Payer: Self-pay | Admitting: Adult Health

## 2020-08-04 NOTE — Telephone Encounter (Signed)
Rescheduled 9/10 appt per LC's instructions. Pt requested to move appt out to December. Pt confirmed new appt date ant ime.

## 2020-08-05 ENCOUNTER — Other Ambulatory Visit: Payer: Self-pay | Admitting: *Deleted

## 2020-08-05 DIAGNOSIS — E042 Nontoxic multinodular goiter: Secondary | ICD-10-CM

## 2020-08-14 ENCOUNTER — Inpatient Hospital Stay: Payer: BC Managed Care – PPO | Admitting: Adult Health

## 2020-08-28 DIAGNOSIS — D1801 Hemangioma of skin and subcutaneous tissue: Secondary | ICD-10-CM | POA: Diagnosis not present

## 2020-08-28 DIAGNOSIS — L821 Other seborrheic keratosis: Secondary | ICD-10-CM | POA: Diagnosis not present

## 2020-08-28 DIAGNOSIS — L820 Inflamed seborrheic keratosis: Secondary | ICD-10-CM | POA: Diagnosis not present

## 2020-08-28 DIAGNOSIS — D225 Melanocytic nevi of trunk: Secondary | ICD-10-CM | POA: Diagnosis not present

## 2020-08-28 DIAGNOSIS — L918 Other hypertrophic disorders of the skin: Secondary | ICD-10-CM | POA: Diagnosis not present

## 2020-08-28 DIAGNOSIS — L814 Other melanin hyperpigmentation: Secondary | ICD-10-CM | POA: Diagnosis not present

## 2020-09-07 DIAGNOSIS — I8311 Varicose veins of right lower extremity with inflammation: Secondary | ICD-10-CM | POA: Diagnosis not present

## 2020-09-07 DIAGNOSIS — I8312 Varicose veins of left lower extremity with inflammation: Secondary | ICD-10-CM | POA: Diagnosis not present

## 2020-09-09 ENCOUNTER — Other Ambulatory Visit (HOSPITAL_COMMUNITY): Payer: Self-pay | Admitting: Dermatology

## 2020-09-09 MED FILL — MUPIROCIN 2% OINTMENT: 2 | 7 days supply | Qty: 22 | Fill #0

## 2020-09-14 DIAGNOSIS — I8311 Varicose veins of right lower extremity with inflammation: Secondary | ICD-10-CM | POA: Diagnosis not present

## 2020-09-14 DIAGNOSIS — I8312 Varicose veins of left lower extremity with inflammation: Secondary | ICD-10-CM | POA: Diagnosis not present

## 2020-09-14 DIAGNOSIS — I83892 Varicose veins of left lower extremities with other complications: Secondary | ICD-10-CM | POA: Diagnosis not present

## 2020-10-12 DIAGNOSIS — R922 Inconclusive mammogram: Secondary | ICD-10-CM | POA: Diagnosis not present

## 2020-10-12 LAB — HM MAMMOGRAPHY

## 2020-10-13 ENCOUNTER — Encounter: Payer: Self-pay | Admitting: Family Medicine

## 2020-10-20 ENCOUNTER — Ambulatory Visit: Payer: Self-pay

## 2020-10-20 ENCOUNTER — Encounter: Payer: Self-pay | Admitting: Family Medicine

## 2020-10-20 ENCOUNTER — Other Ambulatory Visit: Payer: Self-pay

## 2020-10-20 ENCOUNTER — Ambulatory Visit (INDEPENDENT_AMBULATORY_CARE_PROVIDER_SITE_OTHER): Payer: BC Managed Care – PPO

## 2020-10-20 ENCOUNTER — Ambulatory Visit (INDEPENDENT_AMBULATORY_CARE_PROVIDER_SITE_OTHER): Payer: BC Managed Care – PPO | Admitting: Family Medicine

## 2020-10-20 VITALS — BP 116/82 | HR 99 | Ht 67.0 in | Wt 147.0 lb

## 2020-10-20 DIAGNOSIS — G8929 Other chronic pain: Secondary | ICD-10-CM

## 2020-10-20 DIAGNOSIS — I8390 Asymptomatic varicose veins of unspecified lower extremity: Secondary | ICD-10-CM

## 2020-10-20 DIAGNOSIS — M25531 Pain in right wrist: Secondary | ICD-10-CM

## 2020-10-20 DIAGNOSIS — M25572 Pain in left ankle and joints of left foot: Secondary | ICD-10-CM

## 2020-10-20 DIAGNOSIS — M25532 Pain in left wrist: Secondary | ICD-10-CM | POA: Diagnosis not present

## 2020-10-20 DIAGNOSIS — M1811 Unilateral primary osteoarthritis of first carpometacarpal joint, right hand: Secondary | ICD-10-CM

## 2020-10-20 DIAGNOSIS — M25831 Other specified joint disorders, right wrist: Secondary | ICD-10-CM | POA: Insufficient documentation

## 2020-10-20 NOTE — Assessment & Plan Note (Signed)
Severe overall and will likely need surgical intervention at some point in the future.  Can consider injections at follow-up if needed.  Once again would like work-up of autoimmune at some point if this continues.

## 2020-10-20 NOTE — Progress Notes (Signed)
Isabel [name] Phone: [phone] Subjective:   Isabel [name], am serving as a scribe for Dr. Hulan Saas. This visit occurred during the SARS-CoV-2 public health emergency.  Safety protocols were in place, including screening questions prior to the visit, additional usage of staff PPE, and extensive cleaning of exam room while observing appropriate contact time as indicated for disinfecting solutions.   I'm seeing this patient by the request  of:  Koberlein, Steele Berg, MD  CC: Ankle pain, wrist pain  BPZ:[id number]   10/11/2019 Stable overall.  Discussed with patient about posture and ergonomics.  Discussed which activities to do which wants to avoid.  Patient is to increase activity as tolerated.  As long as patient does well follow-up as needed  Update 10/20/2020 Isabel [name] is a 64 y.o. female coming in with complaint of midfoot arthritis and wrist pain. Patient states that she has a cyst near her talus. Pain is worse when she is on her feet a lot. Is using a heel lift in left shoe.  Patient states that it feels like it is a potential for a cyst.  Wanting to know what else we can do about it.  Also feels like she has a cyst in the left wrist and 2nd and 3rd fingers over MCP joints.  Patient also feels like she has a cyst in her wrist near her thumb.  States that it sometimes gives her pain with repetitive activity.  Does do a lot of lifting and moving of furniture on a regular basis.      Past Medical History:  Diagnosis Date  . 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 Breast cancer ([place])    sees onc and gen surg   . Mitral regurgitation    mild on echo from 2009, [name] MVP per notes  . Osteoarthritis    w/ bilat hip resurfacing and possible metal reaction, sees ortho at [facility]  . Scoliosis   . Uterine fibroid   . Varicose veins    goes to [place] [facility], s/p laser and inj tx   Past  Surgical History:  Procedure Laterality Date  . BREAST LUMPECTOMY  09/12   left breast  . GANGLION CYST EXCISION    . left hip resurfaced 2011    . right hip resurfaced 2008    . unterine fibroid  1998   removal   Social History   Socioeconomic History  . Marital status: Married    Spouse name: Not on file  . Number of children: Not on file  . Years of education: Not on file  . Highest education level: Not on file  Occupational History  . Occupation: Art Director/Artist  Tobacco Use  . Smoking status: Never Smoker  . Smokeless tobacco: Never Used  Substance and Sexual Activity  . Alcohol use: Yes    Alcohol/week: 7.0 standard drinks    Types: 7 Glasses of wine per week  . Drug use: [name]  . Sexual activity: Yes    Birth control/protection: Post-menopausal  Other Topics Concern  . Not on file  Social History Narrative   Updated 08/2015:   Art Director/Artis[employer] - currently unemployed   Married to [employer]   [name] children   Exercise 2 days per week/ diet healthy   Christian   Social Determinants of Health   Financial Resource Strain:   . Difficulty of Paying Living Expenses: Not on file  Food Insecurity:   . Worried About Charity fundraiser in the Last Year: Not on file  . Ran Out of Food in the Last Year: Not on file  Transportation Needs:   . Lack of Transportation (Medical): Not on file  . Lack of Transportation (Non-Medical): Not on file  Physical Activity:   . Days of Exercise per Week: Not on file  . Minutes of Exercise per Session: Not on file  Stress:   . Feeling of Stress : Not on file  Social Connections:   . Frequency of Communication with Friends and Family: Not on file  . Frequency of Social Gatherings with Friends and Family: Not on file  . Attends Religious Services: Not on file  . Active Member of Clubs or Organizations: Not on file  . Attends Archivist Meetings: Not on file  . Marital Status: Not on file   Allergies  Allergen Reactions   . Erythromycin Diarrhea and Nausea Only  . Latex     Sores in mouth   Family History  Problem Relation Age of Onset  . Stroke Mother 76       hemorrhagic  . Other Mother        pagets disease breast  . Arthritis Mother        foot, knee problems  . Hypertension Father   . Hyperlipidemia Father   . CAD Father 28       Triple bypass; died age 2  . Arthritis Father   . Dementia Father   . Breast cancer Maternal Aunt   . Esophageal cancer Maternal Grandfather 90  . Dementia Paternal Grandmother   . Heart disease Paternal Grandfather 75       multiple heart attacks  . Arthritis Paternal Uncle   . Colon cancer Neg Hx   . Stomach cancer Neg Hx   . Thyroid disease Neg Hx          Current Outpatient Medications (Other):  Marland Kitchen  Ascorbic Acid (VITA-C PO), Take by mouth. Marland Kitchen  BIOTIN 5000 PO, Take by mouth 2 (two) times a day.  Marland Kitchen  CALCIUM CITRATE PO, Take by mouth daily. .  Cholecalciferol (VITAMIN D3 PO), Take 4,000 Units by mouth 2 (two) times daily.  Marland Kitchen  MAGNESIUM PO, Take by mouth daily. .  Multiple Vitamin (MULTI-VITAMIN DAILY PO), Take by mouth. .  NON FORMULARY, Inflamed-herbal anti inflammatory .  NON FORMULARY, supplement for liver .  Omega-3 Fatty Acids (FISH OIL) 500 MG CAPS, Take 690 mg by mouth 2 (two) times daily.  Marland Kitchen  OVER THE COUNTER MEDICATION, Digestive enzyme .  Probiotic Product (PROBIOTIC-10 PO), Take by mouth. .  trimethoprim-polymyxin b (POLYTRIM) ophthalmic solution, Place 2 drops into the left eye every 4 (four) hours.   Reviewed prior external information including notes and imaging from  primary care provider As well as notes that were available from care everywhere and other healthcare systems.  Past medical history, social, surgical and family history all reviewed in electronic medical record.  [name] pertanent information unless stated regarding to the chief complaint.   Review of Systems:  [name] headache, visual changes, nausea, vomiting, diarrhea,  constipation, dizziness, abdominal pain, skin rash, fevers, chills, night sweats, weight loss, swollen lymph nodes, body aches, joint swelling, chest pain, shortness of breath, mood changes. POSITIVE muscle aches  Objective  Blood pressure 116/82, pulse 99, height 5\' 7"  (1.702 m), weight 147 lb (66.7 kg), last menstrual period 12/05/2012, SpO2 97 %.  General: [name] apparent distress alert and oriented x3 mood and affect normal, dressed appropriately.  HEENT: Pupils equal, extraocular movements intact  Respiratory: Patient's speak in full sentences and does not appear short of breath  Cardiovascular: Trace lower extremity edema, varicose veins noted non tender, [name] erythema  Severely antalgic gait Ankle shows some very mild arthritic changes.  Does have some tightness noted.  Patient points to the anterior talofibular ligament.  Seems to be intact though.  Varicose vein noted in this area.  [name] cyst formation noted.  [name] tenderness over the lateral malleolus.  Neurovascularly intact distally.  Rigid midfoot noted.  Right wrist shows the patient does have some swelling on the palmar aspect of the wrist near the radial area.  Seems to be cystic in nature.  Negative Finkelstein's test.  Decent grip strength but more like 4 out of 5 but symmetric to the contralateral side.  Arthritic changes of the [facility] joint noted.  Hand exam shows that patient does have inclusion cysts of the second and third fingers on the palmar aspect that are freely movable and tender.  Limited musculoskeletal ultrasound was performed and interpreted by Lyndal Pulley  Limited ultrasound of the ankle shows the patient has more of a varicose vein at the anterior lateral aspect of the ankle joint.  [name] cyst noted.  With mild arthritic changes.  Patient does have a bone and seems to be more osteopenic.  [name] signs of any hypoechoic changes of the peroneal tendons.  [name] cortical irregularity of the lateral malleolus.  Also reviewed patient's  right wrist showing that there is likely a cyst possible ganglion but seems to be intra-articular in the radiocarpal joint and very close proximity to the radial artery.  Mild surrounding increase in Doppler flow  Impression: Varicose vein of ankle and patient has likely cystic mass of right wrist.    Impression and Recommendations:     The above documentation has been reviewed and is accurate and complete Lyndal Pulley, DO

## 2020-10-20 NOTE — Assessment & Plan Note (Signed)
Patient continues to have difficulty with the varicosity of the legs which is seems to be what is on ultrasound of the ankle at this time.  I think that this is likely contributing.  No sign of any type of superficial vein phlebitis at the moment.  We discussed compression socks.  Patient will follow up with vascular to discuss treatment options.  Midfoot arthritis noted but overall doing relatively well.

## 2020-10-20 NOTE — Assessment & Plan Note (Signed)
Patient does have a mass that seems to be underneath the radial artery.  Likely ganglion but does seem to be intra-articular in the radiocarpal joint area.  Concerned with potential aspiration and I do think it does go deep into the joint.  X-rays ordered today to further evaluate for osteopenia as well.  We discussed different hand surgeons that I think would be beneficial which patient declined at the moment.  Patient also has what is seems to be more of some simple inclusion cyst of the fingers.  Discussed the potential for laboratory work-up with patient having so many different orthopedic problems but patient declined today but we will consider autoimmune work-up at follow-up.  Follow-up again in  8 weeks

## 2020-10-20 NOTE — Patient Instructions (Signed)
Coban wrap Wrist xray Voltaren gel  Wrist exercises  Ankle is vericose vein-wear compression socks Look into [facility] or [facility] for hand sugeons See me in 2 months, if continue to have trouble will consider labs and bone denisty

## 2020-10-27 ENCOUNTER — Encounter: Payer: Self-pay | Admitting: Family Medicine

## 2020-11-06 ENCOUNTER — Inpatient Hospital Stay: Payer: BC Managed Care – PPO | Admitting: Adult Health

## 2020-12-28 ENCOUNTER — Ambulatory Visit: Payer: BC Managed Care – PPO | Admitting: Family Medicine

## 2021-07-20 ENCOUNTER — Other Ambulatory Visit: Payer: Self-pay

## 2021-07-21 ENCOUNTER — Ambulatory Visit (INDEPENDENT_AMBULATORY_CARE_PROVIDER_SITE_OTHER): Payer: 59 | Admitting: Family Medicine

## 2021-07-21 ENCOUNTER — Encounter: Payer: Self-pay | Admitting: Family Medicine

## 2021-07-21 VITALS — BP 110/80 | HR 68 | Temp 98.0°F | Ht 65.75 in | Wt 145.4 lb

## 2021-07-21 DIAGNOSIS — E042 Nontoxic multinodular goiter: Secondary | ICD-10-CM | POA: Diagnosis not present

## 2021-07-21 DIAGNOSIS — Z0000 Encounter for general adult medical examination without abnormal findings: Secondary | ICD-10-CM | POA: Diagnosis not present

## 2021-07-21 DIAGNOSIS — B351 Tinea unguium: Secondary | ICD-10-CM

## 2021-07-21 DIAGNOSIS — Z13220 Encounter for screening for lipoid disorders: Secondary | ICD-10-CM

## 2021-07-21 DIAGNOSIS — E611 Iron deficiency: Secondary | ICD-10-CM | POA: Diagnosis not present

## 2021-07-21 DIAGNOSIS — R002 Palpitations: Secondary | ICD-10-CM | POA: Diagnosis not present

## 2021-07-21 DIAGNOSIS — Z131 Encounter for screening for diabetes mellitus: Secondary | ICD-10-CM | POA: Diagnosis not present

## 2021-07-21 LAB — TSH: TSH: 0.72 u[IU]/mL (ref 0.35–5.50)

## 2021-07-21 LAB — CBC WITH DIFFERENTIAL/PLATELET
Basophils Absolute: 0 10*3/uL (ref 0.0–0.1)
Basophils Relative: 0.9 % (ref 0.0–3.0)
Eosinophils Absolute: 0.1 10*3/uL (ref 0.0–0.7)
Eosinophils Relative: 1.7 % (ref 0.0–5.0)
HCT: 40.5 % (ref 36.0–46.0)
Hemoglobin: 13.4 g/dL (ref 12.0–15.0)
Lymphocytes Relative: 29.9 % (ref 12.0–46.0)
Lymphs Abs: 1.5 10*3/uL (ref 0.7–4.0)
MCHC: 33.1 g/dL (ref 30.0–36.0)
MCV: 83.5 fl (ref 78.0–100.0)
Monocytes Absolute: 0.5 10*3/uL (ref 0.1–1.0)
Monocytes Relative: 9.5 % (ref 3.0–12.0)
Neutro Abs: 2.9 10*3/uL (ref 1.4–7.7)
Neutrophils Relative %: 58 % (ref 43.0–77.0)
Platelets: 218 10*3/uL (ref 150.0–400.0)
RBC: 4.85 Mil/uL (ref 3.87–5.11)
RDW: 13.1 % (ref 11.5–15.5)
WBC: 5 10*3/uL (ref 4.0–10.5)

## 2021-07-21 LAB — COMPREHENSIVE METABOLIC PANEL
ALT: 17 U/L (ref 0–35)
AST: 18 U/L (ref 0–37)
Albumin: 4.5 g/dL (ref 3.5–5.2)
Alkaline Phosphatase: 62 U/L (ref 39–117)
BUN: 14 mg/dL (ref 6–23)
CO2: 28 mEq/L (ref 19–32)
Calcium: 9.7 mg/dL (ref 8.4–10.5)
Chloride: 103 mEq/L (ref 96–112)
Creatinine, Ser: 0.61 mg/dL (ref 0.40–1.20)
GFR: 95.01 mL/min (ref >60.00)
Glucose, Bld: 88 mg/dL (ref 70–99)
Potassium: 4.1 mEq/L (ref 3.5–5.1)
Sodium: 139 mEq/L (ref 135–145)
Total Bilirubin: 0.6 mg/dL (ref 0.2–1.2)
Total Protein: 6.9 g/dL (ref 6.0–8.3)

## 2021-07-21 LAB — LIPID PANEL
Cholesterol: 167 mg/dL (ref 0–200)
HDL: 77.1 mg/dL (ref >39.00)
LDL Cholesterol: 69 mg/dL (ref 0–99)
NonHDL: 90.12
Total CHOL/HDL Ratio: 2
Triglycerides: 104 mg/dL (ref 0.0–149.0)
VLDL: 20.8 mg/dL (ref 0.0–40.0)

## 2021-07-21 LAB — T4, FREE: Free T4: 1.01 ng/dL (ref 0.60–1.60)

## 2021-07-21 LAB — T3, FREE: T3, Free: 3.3 pg/mL (ref 2.3–4.2)

## 2021-07-21 LAB — FERRITIN: Ferritin: 39.6 ng/mL (ref 10.0–291.0)

## 2021-07-21 NOTE — Patient Instructions (Addendum)
Dr. Celesta Gentile - podiatry  You are eligible for 4th COVID; you can get this at the pharmacy or schedule through [email or web address]

## 2021-07-21 NOTE — Addendum Note (Signed)
Addended by: Amanda Cockayne on: 07/21/2021 09:16 AM   Modules accepted: Orders

## 2021-07-21 NOTE — Progress Notes (Signed)
Isabel [name] DOB: Apr 25, 1956 Encounter date: 07/21/2021  This is a 65 y.o. female who presents for complete physical   History of present illness/Additional concerns: Last visit with me was 07/2020.   Followed with Dr. Ubaldo Glassing for derm but just for single evaluation. She did get some hyperpigmentation in areas where she was frozen.   Colonoscopy due 09/2022  Thyroid US followup for nodules due 07/2022  Benign mammogram 10/2020; next one is scheduled.   Follows with obgyn regularly - [facility]  Schedule changed at work so this has cut into her exercise routine. On feet all the time, walking  a lot. Needs to figure out how to add this in.   Has had a couple of instances with heart racing when she lays down at night to go to bed. Notes more on days that are hot, sweating more. Then eating, has a glass of wine. Thinks related to not keeping up with fluids. In past was dx with mitral valve prolapse and on repeat imaging this was "resolved". Noted in last year, but not regular; not even every month. Can last hours and sometimes just feels like it lasts through night; not there in the morning. Has gotten up and drank more water. No other sx with this - no shortness of breath, dizziness, etc.   Not interested in other vaccinations.   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   Breast cancer ([place])    sees onc and gen surg    Mitral regurgitation    mild on echo from 2009, no MVP per notes   Osteoarthritis    w/ bilat hip resurfacing and possible metal reaction, sees ortho at [facility]   Scoliosis    Uterine fibroid    Varicose veins    goes to [place] [facility], s/p laser and inj tx   Past Surgical History:  Procedure Laterality Date   BREAST LUMPECTOMY  09/12   left breast   GANGLION CYST EXCISION     left hip resurfaced 2011     right hip resurfaced 2008     unterine fibroid  1998   removal   Allergies  Allergen Reactions    Erythromycin Diarrhea and Nausea Only   Latex     Sores in mouth   Current Meds  Medication Sig   Ascorbic Acid (VITA-C PO) Take 1,000 mg by mouth in the morning and at bedtime.   BIOTIN 5000 PO Take by mouth daily.   CALCIUM CITRATE PO Take 333.33 mg by mouth in the morning and at bedtime.   Cholecalciferol (VITAMIN D3 PO) Take 5,000 Units by mouth daily.   Multiple Vitamin (MULTI-VITAMIN DAILY PO) Take by mouth.   NON FORMULARY supplement for liver   OVER THE COUNTER MEDICATION Digestive enzyme   Probiotic Product (PROBIOTIC-10 PO) Take by mouth.   Social History   Tobacco Use   Smoking status: Never   Smokeless tobacco: Never  Substance Use Topics   Alcohol use: Yes    Alcohol/week: 7.0 standard drinks    Types: 7 Glasses of wine per week   Family History  Problem Relation Age of Onset   Stroke Mother 26       hemorrhagic   Other Mother        pagets disease breast   Arthritis Mother        foot, knee problems   Hypertension Father    Hyperlipidemia Father    CAD  Father 1       Triple bypass; died age 49   Arthritis Father    Dementia Father    Breast cancer Maternal Aunt    Esophageal cancer Maternal Grandfather 25   Dementia Paternal Grandmother    Heart disease Paternal Grandfather 78       multiple heart attacks   Arthritis Paternal Uncle    Colon cancer Neg Hx    Stomach cancer Neg Hx    Thyroid disease Neg Hx      Review of Systems  Constitutional:  Negative for activity change, appetite change, chills, fatigue, fever and unexpected weight change.  HENT:  Negative for congestion, ear pain, hearing loss, sinus pressure, sinus pain, sore throat and trouble swallowing.   Eyes:  Negative for pain and visual disturbance.  Respiratory:  Negative for cough, chest tightness, shortness of breath and wheezing.   Cardiovascular:  Positive for palpitations. Negative for chest pain and leg swelling.  Gastrointestinal:  Negative for abdominal pain, blood in stool,  constipation, diarrhea, nausea and vomiting.  Genitourinary:  Negative for difficulty urinating and menstrual problem.  Musculoskeletal:  Negative for arthralgias and back pain.  Skin:  Negative for rash.  Neurological:  Negative for dizziness, weakness, numbness and headaches.  Hematological:  Negative for adenopathy. Does not bruise/bleed easily.  Psychiatric/Behavioral:  Negative for sleep disturbance and suicidal ideas. The patient is not nervous/anxious.    CBC:  Lab Results  Component Value Date   WBC 4.4 07/17/2020   HGB 13.4 07/17/2020   HGB 13.3 05/29/2014   HCT 41.1 07/17/2020   HCT 39.9 05/29/2014   MCH 28.8 07/17/2020   MCHC 32.6 07/17/2020   RDW 12.7 07/17/2020   RDW 12.9 05/29/2014   PLT 248 07/17/2020   PLT 178 05/29/2014   MPV 10.9 07/17/2020   CMP: Lab Results  Component Value Date   NA 140 07/17/2020   NA 140 05/29/2014   K 4.9 07/17/2020   K 4.3 05/29/2014   CL 105 07/17/2020   CL 105 03/01/2013   CO2 28 07/17/2020   CO2 27 05/29/2014   ANIONGAP 8 05/29/2014   GLUCOSE 92 07/17/2020   GLUCOSE 97 05/29/2014   GLUCOSE 101 (H) 03/01/2013   BUN 12 07/17/2020   BUN 15.3 05/29/2014   CREATININE 0.60 07/17/2020   CREATININE 0.8 05/29/2014   GFRAA >90 10/21/2011   CALCIUM 9.9 07/17/2020   CALCIUM 9.1 05/29/2014   PROT 6.6 07/17/2020   PROT 6.5 05/29/2014   BILITOT 0.6 07/17/2020   BILITOT 0.47 05/29/2014   ALKPHOS 60 07/10/2019   ALKPHOS 51 05/29/2014   ALT 16 07/17/2020   ALT 25 05/29/2014   AST 19 07/17/2020   AST 21 05/29/2014   LIPID: Lab Results  Component Value Date   CHOL 171 07/17/2020   TRIG 77 07/17/2020   HDL 78 07/17/2020   LDLCALC 77 07/17/2020    Objective:  BP 110/80 (BP Location: Left Arm, Patient Position: Sitting, Cuff Size: Normal)   Pulse 68   Temp 98 F (36.7 C) (Oral)   Ht 5' 5.75" (1.67 m)   Wt 145 lb 6.4 oz (66 kg)   LMP 12/05/2012   SpO2 99%   BMI 23.65 kg/m   Weight: 145 lb 6.4 oz (66 kg)   BP Readings  from Last 3 Encounters:  07/21/21 110/80  10/20/20 116/82  07/17/20 102/80   Wt Readings from Last 3 Encounters:  07/21/21 145 lb 6.4 oz (66 kg)  10/20/20 147  lb (66.7 kg)  07/17/20 142 lb 9.6 oz (64.7 kg)    Physical Exam Constitutional:      General: She is not in acute distress.    Appearance: She is well-developed.  HENT:     Head: Normocephalic and atraumatic.     Right Ear: External ear normal.     Left Ear: External ear normal.     Mouth/Throat:     Pharynx: No oropharyngeal exudate.  Eyes:     Conjunctiva/sclera: Conjunctivae normal.     Pupils: Pupils are equal, round, and reactive to light.  Neck:     Thyroid: No thyromegaly.  Cardiovascular:     Rate and Rhythm: Normal rate and regular rhythm. Occasional Extrasystoles are present.    Heart sounds: Murmur heard.  Systolic murmur is present with a grade of 2/6.    No friction rub. No gallop.  Pulmonary:     Effort: Pulmonary effort is normal.     Breath sounds: Normal breath sounds.  Abdominal:     General: Bowel sounds are normal. There is no distension.     Palpations: Abdomen is soft. There is no mass.     Tenderness: There is no abdominal tenderness. There is no guarding.     Hernia: No hernia is present.  Musculoskeletal:        General: No tenderness or deformity. Normal range of motion.     Cervical back: Normal range of motion and neck supple.  Lymphadenopathy:     Cervical: No cervical adenopathy.  Skin:    General: Skin is warm and dry.     Findings: No rash.  Neurological:     Mental Status: She is alert and oriented to person, place, and time.     Deep Tendon Reflexes: Reflexes normal.     Reflex Scores:      Tricep reflexes are 2+ on the right side and 2+ on the left side.      Bicep reflexes are 2+ on the right side and 2+ on the left side.      Brachioradialis reflexes are 2+ on the right side and 2+ on the left side.      Patellar reflexes are 2+ on the right side and 2+ on the left  side. Psychiatric:        Speech: Speech normal.        Behavior: Behavior normal.        Thought Content: Thought content normal.    Assessment/Plan: Health Maintenance Due  Topic Date Due   COVID-19 Vaccine (4 - Booster for Pfizer series) 02/02/2021   INFLUENZA VACCINE  07/05/2021   Health Maintenance reviewed - declines vaccinations other than covid.  1. Preventative health care Work on getting in some exercise/resistance training on regular basis.   2. Multinodular goiter Due for [place] 07/2022 for comparison  3. Low iron Recheck today. - CBC with Differential/Platelet; Future - Ferritin; Future  4. Lipid screening - Lipid panel; Future  5. Screening for diabetes mellitus - Comprehensive metabolic panel; Future  6. Onychomycosis She is considering seeing podiatry; other option is for [place] to reculture nail.   7. Palpitations Ekg stable from previous, sinus rhythm. No acute changes. Will repeat baseline echo due to systolic murmur and new onset palpitations. - TSH; Future - T4, free; Future - T3, free; Future - EKG 12-Lead   No follow-ups on file.  Micheline Rough, MD

## 2021-07-29 ENCOUNTER — Other Ambulatory Visit (HOSPITAL_COMMUNITY): Payer: Self-pay

## 2021-07-29 MED ORDER — CEPHALEXIN 500 MG PO CAPS
ORAL_CAPSULE | ORAL | 0 refills | Status: DC
  Filled 2021-07-29: qty 2, 1d supply, fill #0

## 2021-07-30 ENCOUNTER — Other Ambulatory Visit (HOSPITAL_COMMUNITY): Payer: Self-pay

## 2021-07-30 MED ORDER — AMOXICILLIN 500 MG PO CAPS
ORAL_CAPSULE | ORAL | 0 refills | Status: DC
  Filled 2021-07-30: qty 21, 7d supply, fill #0

## 2021-07-30 MED ORDER — HYDROCODONE-ACETAMINOPHEN 10-325 MG PO TABS
ORAL_TABLET | ORAL | 0 refills | Status: DC
  Filled 2021-07-30: qty 12, 3d supply, fill #0

## 2021-08-05 ENCOUNTER — Other Ambulatory Visit (HOSPITAL_COMMUNITY): Payer: Self-pay

## 2021-08-13 ENCOUNTER — Other Ambulatory Visit (HOSPITAL_COMMUNITY): Payer: Self-pay

## 2021-08-23 ENCOUNTER — Telehealth (HOSPITAL_COMMUNITY): Payer: Self-pay | Admitting: Family Medicine

## 2021-08-23 NOTE — Telephone Encounter (Signed)
Patient called and cancelled echocardiogram for the reason below:  08/23/2021 8:49 AM [id number], ASHLEY N  Cancel Rsn: Patient (doesn't have the money, will callback to reschedule.)  Order will be removed from the active echo WQ and if patient calls back to reschedule we will reinstate the order. Thank you

## 2021-08-25 ENCOUNTER — Other Ambulatory Visit (HOSPITAL_COMMUNITY): Payer: 59

## 2021-08-25 NOTE — Telephone Encounter (Signed)
Noted. Ekg was stable in office. Ok to hold off unless palpitations worsening; then would reconsider.

## 2021-11-08 ENCOUNTER — Other Ambulatory Visit (HOSPITAL_COMMUNITY): Payer: Self-pay

## 2021-11-08 MED ORDER — SULFAMETHOXAZOLE-TRIMETHOPRIM 800-160 MG PO TABS
ORAL_TABLET | ORAL | 0 refills | Status: DC
  Filled 2021-11-08: qty 6, 3d supply, fill #0

## 2021-11-20 LAB — HM MAMMOGRAPHY

## 2021-11-23 ENCOUNTER — Encounter: Payer: Self-pay | Admitting: Family Medicine

## 2021-12-02 ENCOUNTER — Encounter (HOSPITAL_COMMUNITY): Payer: Self-pay

## 2021-12-02 ENCOUNTER — Other Ambulatory Visit: Payer: Self-pay

## 2021-12-02 ENCOUNTER — Inpatient Hospital Stay (HOSPITAL_COMMUNITY)
Admission: EM | Admit: 2021-12-02 | Discharge: 2021-12-13 | DRG: 467 | Disposition: A | Discharge status: Home / Self Care | Payer: 59 | Attending: Internal Medicine | Admitting: Internal Medicine

## 2021-12-02 ENCOUNTER — Emergency Department (HOSPITAL_COMMUNITY): Payer: 59

## 2021-12-02 DIAGNOSIS — Z96641 Presence of right artificial hip joint: Secondary | ICD-10-CM | POA: Diagnosis not present

## 2021-12-02 DIAGNOSIS — N390 Urinary tract infection, site not specified: Secondary | ICD-10-CM | POA: Diagnosis not present

## 2021-12-02 DIAGNOSIS — Z96642 Presence of left artificial hip joint: Secondary | ICD-10-CM | POA: Diagnosis not present

## 2021-12-02 DIAGNOSIS — M978XXA Periprosthetic fracture around other internal prosthetic joint, initial encounter: Secondary | ICD-10-CM | POA: Diagnosis not present

## 2021-12-02 DIAGNOSIS — W19XXXA Unspecified fall, initial encounter: Secondary | ICD-10-CM | POA: Diagnosis not present

## 2021-12-02 DIAGNOSIS — R600 Localized edema: Secondary | ICD-10-CM | POA: Diagnosis not present

## 2021-12-02 DIAGNOSIS — M9702XA Periprosthetic fracture around internal prosthetic left hip joint, initial encounter: Secondary | ICD-10-CM | POA: Diagnosis present

## 2021-12-02 DIAGNOSIS — S72002A Fracture of unspecified part of neck of left femur, initial encounter for closed fracture: Secondary | ICD-10-CM | POA: Diagnosis present

## 2021-12-02 DIAGNOSIS — S72092A Other fracture of head and neck of left femur, initial encounter for closed fracture: Secondary | ICD-10-CM | POA: Diagnosis not present

## 2021-12-02 DIAGNOSIS — Z471 Aftercare following joint replacement surgery: Secondary | ICD-10-CM | POA: Diagnosis not present

## 2021-12-02 DIAGNOSIS — Z8249 Family history of ischemic heart disease and other diseases of the circulatory system: Secondary | ICD-10-CM

## 2021-12-02 DIAGNOSIS — R002 Palpitations: Secondary | ICD-10-CM | POA: Diagnosis not present

## 2021-12-02 DIAGNOSIS — Y92009 Unspecified place in unspecified non-institutional (private) residence as the place of occurrence of the external cause: Secondary | ICD-10-CM

## 2021-12-02 DIAGNOSIS — D62 Acute posthemorrhagic anemia: Secondary | ICD-10-CM | POA: Diagnosis not present

## 2021-12-02 DIAGNOSIS — Z20822 Contact with and (suspected) exposure to covid-19: Secondary | ICD-10-CM | POA: Diagnosis present

## 2021-12-02 DIAGNOSIS — M25552 Pain in left hip: Secondary | ICD-10-CM | POA: Diagnosis present

## 2021-12-02 DIAGNOSIS — T8484XA Pain due to internal orthopedic prosthetic devices, implants and grafts, initial encounter: Secondary | ICD-10-CM | POA: Diagnosis not present

## 2021-12-02 DIAGNOSIS — Y92008 Other place in unspecified non-institutional (private) residence as the place of occurrence of the external cause: Secondary | ICD-10-CM

## 2021-12-02 DIAGNOSIS — R509 Fever, unspecified: Secondary | ICD-10-CM | POA: Diagnosis not present

## 2021-12-02 DIAGNOSIS — S72002P Fracture of unspecified part of neck of left femur, subsequent encounter for closed fracture with malunion: Secondary | ICD-10-CM | POA: Diagnosis not present

## 2021-12-02 DIAGNOSIS — Z419 Encounter for procedure for purposes other than remedying health state, unspecified: Secondary | ICD-10-CM

## 2021-12-02 DIAGNOSIS — E876 Hypokalemia: Secondary | ICD-10-CM | POA: Diagnosis present

## 2021-12-02 DIAGNOSIS — M9702XD Periprosthetic fracture around internal prosthetic left hip joint, subsequent encounter: Secondary | ICD-10-CM | POA: Diagnosis not present

## 2021-12-02 DIAGNOSIS — X58XXXA Exposure to other specified factors, initial encounter: Secondary | ICD-10-CM | POA: Diagnosis present

## 2021-12-02 DIAGNOSIS — Z800 Family history of malignant neoplasm of digestive organs: Secondary | ICD-10-CM

## 2021-12-02 DIAGNOSIS — Z803 Family history of malignant neoplasm of breast: Secondary | ICD-10-CM

## 2021-12-02 DIAGNOSIS — E871 Hypo-osmolality and hyponatremia: Secondary | ICD-10-CM | POA: Diagnosis present

## 2021-12-02 DIAGNOSIS — Z853 Personal history of malignant neoplasm of breast: Secondary | ICD-10-CM

## 2021-12-02 DIAGNOSIS — Z823 Family history of stroke: Secondary | ICD-10-CM | POA: Diagnosis not present

## 2021-12-02 DIAGNOSIS — Z09 Encounter for follow-up examination after completed treatment for conditions other than malignant neoplasm: Secondary | ICD-10-CM

## 2021-12-02 DIAGNOSIS — Z96649 Presence of unspecified artificial hip joint: Secondary | ICD-10-CM | POA: Diagnosis not present

## 2021-12-02 DIAGNOSIS — E861 Hypovolemia: Secondary | ICD-10-CM | POA: Diagnosis not present

## 2021-12-02 LAB — CBC WITH DIFFERENTIAL/PLATELET
Abs Immature Granulocytes: 0.03 10*3/uL (ref 0.00–0.07)
Basophils Absolute: 0 10*3/uL (ref 0.0–0.1)
Basophils Relative: 1 %
Eosinophils Absolute: 0.1 10*3/uL (ref 0.0–0.5)
Eosinophils Relative: 1 %
HCT: 39.9 % (ref 36.0–46.0)
Hemoglobin: 13 g/dL (ref 12.0–15.0)
Immature Granulocytes: 0 %
Lymphocytes Relative: 23 %
Lymphs Abs: 1.7 10*3/uL (ref 0.7–4.0)
MCH: 27.8 pg (ref 26.0–34.0)
MCHC: 32.6 g/dL (ref 30.0–36.0)
MCV: 85.3 fL (ref 80.0–100.0)
Monocytes Absolute: 0.7 10*3/uL (ref 0.1–1.0)
Monocytes Relative: 9 %
Neutro Abs: 4.9 10*3/uL (ref 1.7–7.7)
Neutrophils Relative %: 66 %
Platelets: 231 10*3/uL (ref 150–400)
RBC: 4.68 MIL/uL (ref 3.87–5.11)
RDW: 14 % (ref 11.5–15.5)
WBC: 7.5 10*3/uL (ref 4.0–10.5)
nRBC: 0 % (ref 0.0–0.2)

## 2021-12-02 LAB — RESP PANEL BY RT-PCR (FLU A&B, COVID) ARPGX2
Influenza A by PCR: NEGATIVE
Influenza B by PCR: NEGATIVE
SARS Coronavirus 2 by RT PCR: NEGATIVE

## 2021-12-02 LAB — PROTIME-INR
INR: 1 (ref 0.8–1.2)
Prothrombin Time: 12.8 seconds (ref 11.4–15.2)

## 2021-12-02 LAB — BASIC METABOLIC PANEL
Anion gap: 12 (ref 5–15)
BUN: 14 mg/dL (ref 8–23)
CO2: 21 mmol/L — ABNORMAL LOW (ref 22–32)
Calcium: 9.4 mg/dL (ref 8.9–10.3)
Chloride: 105 mmol/L (ref 98–111)
Creatinine, Ser: 0.48 mg/dL (ref 0.44–1.00)
GFR, Estimated: >60 mL/min (ref >60)
Glucose, Bld: 101 mg/dL — ABNORMAL HIGH (ref 70–99)
Potassium: 3.6 mmol/L (ref 3.5–5.1)
Sodium: 138 mmol/L (ref 135–145)

## 2021-12-02 LAB — TYPE AND SCREEN
ABO/RH(D): O NEG
Antibody Screen: NEGATIVE

## 2021-12-02 MED ORDER — HYDROMORPHONE HCL 1 MG/ML IJ SOLN
0.5000 mg | INTRAMUSCULAR | Status: DC | PRN
  Administered 2021-12-02: 21:00:00 0.5 mg via INTRAVENOUS
  Filled 2021-12-02: qty 1

## 2021-12-02 MED ORDER — FENTANYL CITRATE PF 50 MCG/ML IJ SOSY
25.0000 ug | PREFILLED_SYRINGE | INTRAMUSCULAR | Status: DC | PRN
  Administered 2021-12-02: 25 ug via INTRAVENOUS
  Filled 2021-12-02: qty 1

## 2021-12-02 MED ORDER — ONDANSETRON HCL 4 MG/2ML IJ SOLN
4.0000 mg | Freq: Four times a day (QID) | INTRAMUSCULAR | Status: DC | PRN
  Administered 2021-12-02: 4 mg via INTRAVENOUS
  Filled 2021-12-02: qty 2

## 2021-12-02 MED ORDER — NALOXONE HCL 0.4 MG/ML IJ SOLN: 0.4000 mg | INTRAMUSCULAR | Status: DC | PRN

## 2021-12-02 MED ORDER — ACETAMINOPHEN 650 MG RE SUPP: 650.0000 mg | Freq: Four times a day (QID) | RECTAL | Status: DC | PRN

## 2021-12-02 MED ORDER — ACETAMINOPHEN 325 MG PO TABS
650.0000 mg | ORAL_TABLET | Freq: Four times a day (QID) | ORAL | Status: DC | PRN
  Administered 2021-12-03 – 2021-12-08 (×4): 650 mg via ORAL
  Filled 2021-12-02 (×5): qty 2

## 2021-12-02 NOTE — ED Provider Notes (Signed)
[facility] DEPT Provider Note   CSN: [id number] Arrival date & time: 12/02/21  1945     History Chief Complaint  Patient presents with   Hip Pain    Isabel [name] is a 65 y.o. female.   Hip Pain Pertinent negatives include no chest pain, no abdominal pain and no shortness of breath. Patient with left hip pain.  Has had issues with it for a while now.  Had a hip resurfacing over a decade ago bilaterally in [place].  States that the doctor retired and she is trying to get into see Dr. Ninfa Linden.  States she is not been able to see them yet due to issues with getting the information transfer.  States she felt a pop at home today.  Severe left hip pain.  No other injury.  Not on blood thinners.     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   Breast cancer ([place])    sees onc and gen surg    Mitral regurgitation    mild on echo from 2009, no MVP per notes   Osteoarthritis    w/ bilat hip resurfacing and possible metal reaction, sees ortho at [facility]   Scoliosis    Uterine fibroid    Varicose veins    goes to [place] [facility], s/p laser and inj tx    Patient Active Problem List   Diagnosis Date Noted   Mass of joint of right wrist 10/20/2020   Arthritis of carpometacarpal ([place]) joint of right thumb 10/20/2020   Synovitis of toe 06/21/2019   Arthritis of midfoot 06/21/2019   Pain in hip region after total hip replacement ([place]) 06/05/2018   Multinodular goiter 01/11/2017   Varicosities of leg 08/07/2015   Osteoarthritis 08/07/2015   Malignant neoplasm of upper-outer quadrant of left female breast ([place]) 10/13/2011    Past Surgical History:  Procedure Laterality Date   BREAST LUMPECTOMY  09/12   left breast   GANGLION CYST EXCISION     left hip resurfaced 2011     right hip resurfaced 2008     unterine fibroid  1998   removal     OB History   No obstetric history on file.      Family History  Problem Relation Age of Onset   Stroke Mother 40       hemorrhagic   Other Mother        pagets disease breast   Arthritis Mother        foot, knee problems   Hypertension Father    Hyperlipidemia Father    CAD Father 69       Triple bypass; died age 63   Arthritis Father    Dementia Father    Breast cancer Maternal Aunt    Esophageal cancer Maternal Grandfather 77   Dementia Paternal Grandmother    Heart disease Paternal Grandfather 78       multiple heart attacks   Arthritis Paternal Uncle    Colon cancer Neg Hx    Stomach cancer Neg Hx    Thyroid disease Neg Hx     Social History   Tobacco Use   Smoking status: Never   Smokeless tobacco: Never  Substance Use Topics   Alcohol use: Yes    Alcohol/week: 7.0 standard drinks    Types: 7 Glasses of wine per week   Drug use: No    Home Medications Prior to Admission  medications   Medication Sig Start Date End Date Taking? Authorizing Provider  amoxicillin (AMOXIL) 500 MG capsule Take 1 capsule by mouth 3 times daily until gone 07/30/21     Ascorbic Acid (VITA-C PO) Take 1,000 mg by mouth in the morning and at bedtime.    [provider]  BIOTIN 5000 PO Take by mouth daily.    [provider]  CALCIUM CITRATE PO Take 333.33 mg by mouth in the morning and at bedtime.    [provider]  cephALEXin (KEFLEX) 500 MG capsule Take  2 capsules by mouth 1 hour prior to surgery 07/29/21     Cholecalciferol (VITAMIN D3 PO) Take 5,000 Units by mouth daily.    [provider]  HYDROcodone-acetaminophen (NORCO) 10-325 MG tablet Take 1 tablet by mouth every 6 hours as needed for pain 07/30/21     Multiple Vitamin (MULTI-VITAMIN DAILY PO) Take by mouth.    [provider]  NON FORMULARY supplement for liver    [provider]  OVER THE COUNTER MEDICATION Digestive enzyme    [provider]  Probiotic Product (PROBIOTIC-10 PO) Take by mouth.    [provider]  sulfamethoxazole-trimethoprim (BACTRIM DS) 800-160 MG tablet Take 1 tablet by mouth twice a day for 3 days. 11/08/21       Allergies    Erythromycin and Latex  Review of Systems   Review of Systems  Constitutional:  Negative for appetite change.  HENT:  Negative for congestion.   Respiratory:  Negative for shortness of breath.   Cardiovascular:  Negative for chest pain.  Gastrointestinal:  Negative for abdominal pain.  Genitourinary:  Negative for flank pain.  Musculoskeletal:        Left hip pain.  Skin:  Negative for rash.  Neurological:  Negative for weakness.  Hematological:  Negative for adenopathy.  Psychiatric/Behavioral:  Negative for confusion.    Physical Exam Updated Vital Signs BP (!) 162/88    Pulse 88    Temp 97.9 F (36.6 C) (Oral)    Resp 19    Ht 5\' 6"  (1.676 m)    Wt 65.8 kg    LMP 12/05/2012    SpO2 100%    BMI 23.40 kg/m   Physical Exam Vitals and nursing note reviewed.  HENT:     Head: Atraumatic.  Eyes:     Pupils: Pupils are equal, round, and reactive to light.  Pulmonary:     Breath sounds: No wheezing.  Abdominal:     General: There is no distension.  Musculoskeletal:        General: Tenderness present.     Cervical back: Neck supple.     Comments: Pain with moving the left hip.  Left lower extremity somewhat shortened.  No real lateral tenderness.  Sensation intact in foot.  No knee tenderness.  No lumbar tenderness.  Pelvis stable.  Skin:    General: Skin is warm.     Capillary Refill: Capillary refill takes less than 2 seconds.  Neurological:     Mental Status: She is alert and oriented to person, place, and time.    ED Results / Procedures / Treatments   Labs (all labs ordered are listed, but only abnormal results are displayed) Labs Reviewed  BASIC METABOLIC PANEL - Abnormal; Notable for the following components:      Result Value   CO2 21 (*)    Glucose, Bld 101 (*)    All other components within normal limits  RESP PANEL BY RT-PCR (FLU A&B, COVID) ARPGX2  CBC WITH DIFFERENTIAL/PLATELET  PROTIME-INR  TYPE AND SCREEN  ABO/RH    EKG None  Radiology DG Chest 1 View  Result Date: 12/02/2021 CLINICAL DATA:  Hip fracture EXAM: CHEST  1 VIEW COMPARISON:  08/24/2011 FINDINGS: The heart size and mediastinal contours are within normal limits. Both lungs are clear. The visualized skeletal structures are unremarkable. IMPRESSION: No active disease. Electronically Signed   By: Donavan Foil M.D.   On: 12/02/2021 21:12   DG Hip Unilat W or Wo Pelvis 2-3 Views Left  Result Date: 12/02/2021 CLINICAL DATA:  Left hip pain. EXAM: DG HIP (WITH OR WITHOUT PELVIS) 2-3V LEFT COMPARISON:  None. FINDINGS: Bilateral femoral head replacements. There is apparent fracture of the superior aspect of the left femoral neck. There is proximal migration of the femoral shaft in relation to the arthroplasty. No dislocation. The bones are osteopenic. Degenerative changes of the lower lumbar spine. The soft tissues are unremarkable. IMPRESSION: Mildly displaced left femoral neck fracture. No dislocation. Electronically Signed   By: Anner Crete M.D.   On: 12/02/2021 20:39    Procedures Procedures   Medications Ordered in ED Medications  HYDROmorphone (DILAUDID) injection 0.5 mg (0.5 mg Intravenous Given 12/02/21 2034)    ED Course  I have reviewed the triage vital signs and the nursing notes.  Pertinent labs & imaging results that were available during my care of the patient were reviewed by me and considered in my medical decision making (see chart for details).    MDM Rules/Calculators/A&P                         Patient with previous bilateral hip resurfacing.  That doctors however retired.  Attempting to be in to see Dr. Ninfa Linden but has not been able to see them yet.  Unfortunately had a hip fracture today.  Left femoral neck fracture.  Discussed with Dr. Ninfa Linden.  He cannot manage at this since he does not do  the revision of this.  Also has not seen the patient so can go to the unassigned doctor.  Discussed with Dr. Lynann Bologna.  Recommends admission to internal medicine.  He will work on finding a Psychologist, sport and exercise that can do this.  N.p.o. at midnight.  Will discuss with internal medicine Dr.    Final Clinical Impression(s) / ED Diagnoses Final diagnoses:  Closed fracture of left hip, initial encounter [facility])    Rx / [place] Orders ED Discharge Orders     None        Davonna Belling, MD 12/02/21 2225

## 2021-12-02 NOTE — ED Notes (Signed)
Pt placed on purewick 

## 2021-12-02 NOTE — ED Triage Notes (Addendum)
Pt states that she felt her L hip "pop out" while at home today. Visible muscle spasms. Unable to bear weight. + shortening.

## 2021-12-02 NOTE — H&P (Signed)
History and Physical    PLEASE NOTE THAT DRAGON DICTATION SOFTWARE WAS USED IN THE CONSTRUCTION OF THIS NOTE.   Isabel [name] UQJ:[id number] DOB: 09/11/1956 DOA: 12/02/2021  PCP: Caren Macadam, MD  Patient coming from: home   I have personally briefly reviewed patient's old medical records in [place]  Chief Complaint: Left hip pain  HPI: Isabel [name] is a 65 y.o. female with medical history significant for osteoarthritis who is admitted to [facility] on 12/02/2021 with acute left femoral neck fracture after presenting from home to [facility] ED complaining of left hip pain.    Patient reports tripping while attempting to ambulate at home, resulting in a ground level fall during which left hip was the principal point of contact with the floor below. As a result of this fall, the patient reports immediate development of sharp left hip pain, with radiation into the left groin. States that this pain has been constant since onset, with exacerbation when attempting to move the left lower extremity.  As a consequence of the associated intensity of this discomfort, reports that he is unable to bear weight on the left lower extremity at this time.  This is relative to baseline functional status in which the patient lives independently as well as baseline ambulatory status in which the patient reports the ability to ambulate without assistance, including no need for support devices.  Otherwise, denies any acute arthralgias or myalgias as a result of the above fall.  Denies any acute numbness or paresthesias in bilateral lower extremities.   Did not hit head as a component of this fall, and denies any associated loss of consciousness.  Denies any preceding or associated chest pain, shortness of breath, diaphoresis, palpitations, nausea, vomiting, dizziness, presyncope, or syncope.  Denies any subsequent headache, neck pain, blurry vision, or diplopia.  Not on any blood  thinners as an outpatient, including no aspirin.  Denies any known history of coronary artery disease or CHF. denies any recent orthopnea, PND, or peripheral edema.   She has a history of bilateral hip replacements via bilateral hip resurfacing.    ED Course:  Vital signs in the ED were notable for the following: Afebrile; heart rate 78-92; blood pressure initially 160/88, with ensuing decreased to 134/71 following interval administration of IV analgesia, as further detailed below; respiratory rate 19-20, oxygen saturation 9800% on room air.  Labs were notable for the following: BMP notable for creatinine 0.48, glucose 101.  CBC notable for white blood cell count 7500, hemoglobin 13.  INR 1.0.  COVID-19/influenza PCR were checked in the ED today and found to be negative.  Imaging and additional notable ED work-up: Plain films of the left hip/pelvis showed mildly displaced left femoral neck fracture without evidence of dislocation.  Chest x-ray showed no evidence of acute cardiopulmonary process.  EDP discussed the patient's case and imaging with the on-call orthopedic surgeon, Dr. Lynann Bologna, who recommended admission to the hospitalist service for further evaluation and management of acute left hip fracture.  Dr. Lynann Bologna to speak with Orthopedic practice to determine which orthopedic surgeon to evaluate the patient in the morning, with plan for surgery later today.  He requests patient be made n.p.o. after midnight.  While in the ED, the following were administered: Dilaudid 0.5 mg IV x2     Review of Systems: As per HPI otherwise 10 point review of systems negative.   Past Medical History:  Diagnosis Date   Arthritis    osteoarthritis  in bilateral hips   Breast cancer ([place])    sees onc and gen surg    Mitral regurgitation    mild on echo from 2009, no MVP per notes   Osteoarthritis    w/ bilat hip resurfacing and possible metal reaction, sees ortho at [facility]   Scoliosis    Uterine  fibroid    Varicose veins    goes to [place] [facility], s/p laser and inj tx    Past Surgical History:  Procedure Laterality Date   BREAST LUMPECTOMY  09/12   left breast   GANGLION CYST EXCISION     left hip resurfaced 2011     right hip resurfaced 2008     unterine fibroid  1998   removal    Social History:  reports that she has never smoked. She has never used smokeless tobacco. She reports current alcohol use of about 7.0 standard drinks per week. She reports that she does not use drugs.   Allergies  Allergen Reactions   Erythromycin Diarrhea and Nausea Only   Latex     Sores in mouth    Family History  Problem Relation Age of Onset   Stroke Mother 45       hemorrhagic   Other Mother        pagets disease breast   Arthritis Mother        foot, knee problems   Hypertension Father    Hyperlipidemia Father    CAD Father 78       Triple bypass; died age 37   Arthritis Father    Dementia Father    Breast cancer Maternal Aunt    Esophageal cancer Maternal Grandfather 80   Dementia Paternal Grandmother    Heart disease Paternal Grandfather 78       multiple heart attacks   Arthritis Paternal Uncle    Colon cancer Neg Hx    Stomach cancer Neg Hx    Thyroid disease Neg Hx     Family history reviewed and not pertinent    Prior to Admission medications   Medication Sig Start Date End Date Taking? Authorizing Provider  amoxicillin (AMOXIL) 500 MG capsule Take 1 capsule by mouth 3 times daily until gone 07/30/21     Ascorbic Acid (VITA-C PO) Take 1,000 mg by mouth in the morning and at bedtime.    [provider]  BIOTIN 5000 PO Take by mouth daily.    [provider]  CALCIUM CITRATE PO Take 333.33 mg by mouth in the morning and at bedtime.    [provider]  cephALEXin (KEFLEX) 500 MG capsule Take  2 capsules by mouth 1 hour prior to surgery 07/29/21     Cholecalciferol (VITAMIN D3 PO) Take 5,000 Units by mouth daily.    [provider]  HYDROcodone-acetaminophen (NORCO) 10-325 MG tablet Take 1 tablet by mouth every 6 hours as needed for pain 07/30/21     Multiple Vitamin (MULTI-VITAMIN DAILY PO) Take by mouth.    [provider]  NON FORMULARY supplement for liver    [provider]  OVER THE COUNTER MEDICATION Digestive enzyme    [provider]  Probiotic Product (PROBIOTIC-10 PO) Take by mouth.    [provider]  sulfamethoxazole-trimethoprim (BACTRIM DS) 800-160 MG tablet Take 1 tablet by mouth twice a day for 3 days. 11/08/21        Objective    Physical Exam: Vitals:   12/02/21 1958 12/02/21 2135 12/02/21  2200 12/02/21 2230  BP:  (!) 162/88 140/74 (!) 142/92  Pulse:  88 86 83  Resp:  19  13  Temp:      TempSrc:      SpO2:  100% 99% 100%  Weight: 65.8 kg     Height: 5\' 6"  (1.676 m)       General: appears to be stated age; alert, oriented Skin: warm, dry, no rash Head:  AT/[place] Mouth:  Oral mucosa membranes appear moist, normal dentition Neck: supple; trachea midline Heart:  RRR; did not appreciate any M/R/G Lungs: CTAB, did not appreciate any wheezes, rales, or rhonchi Abdomen: + BS; soft, ND, NT Vascular: 2+ pedal pulses b/l; 2+ radial pulses b/l Extremities: no peripheral edema, no muscle wasting Neuro: sensation intact in upper and lower extremities b/l; deferred evaluation of strength in the left lower extremity in the setting of presenting acute left hip fracture    Labs on Admission: I have personally reviewed following labs and imaging studies  CBC: Recent Labs  Lab 12/02/21 2043  WBC 7.5  NEUTROABS 4.9  HGB 13.0  HCT 39.9  MCV 85.3  PLT 979   Basic Metabolic Panel: Recent Labs  Lab 12/02/21 2043  NA 138  K 3.6  CL 105  CO2 21*  GLUCOSE 101*  BUN 14  CREATININE 0.48  CALCIUM 9.4   GFR: Estimated Creatinine Clearance: 67.4 mL/min (by C-G formula based on SCr of 0.48 mg/dL). Liver Function Tests: No results for input(s):  AST, ALT, ALKPHOS, BILITOT, PROT, ALBUMIN in the last 168 hours. No results for input(s): LIPASE, AMYLASE in the last 168 hours. No results for input(s): AMMONIA in the last 168 hours. Coagulation Profile: Recent Labs  Lab 12/02/21 2043  INR 1.0   Cardiac Enzymes: No results for input(s): CKTOTAL, CKMB, CKMBINDEX, TROPONINI in the last 168 hours. BNP (last 3 results) No results for input(s): PROBNP in the last 8760 hours. HbA1C: No results for input(s): HGBA1C in the last 72 hours. CBG: No results for input(s): GLUCAP in the last 168 hours. Lipid Profile: No results for input(s): CHOL, HDL, LDLCALC, TRIG, CHOLHDL, LDLDIRECT in the last 72 hours. Thyroid Function Tests: No results for input(s): TSH, T4TOTAL, FREET4, T3FREE, THYROIDAB in the last 72 hours. Anemia Panel: No results for input(s): VITAMINB12, FOLATE, FERRITIN, TIBC, IRON, RETICCTPCT in the last 72 hours. Urine analysis: No results found for: COLORURINE, APPEARANCEUR, LABSPEC, [place], GLUCOSEU, [place], BILIRUBINUR, KETONESUR, PROTEINUR, UROBILINOGEN, NITRITE, LEUKOCYTESUR  Radiological Exams on Admission: DG Chest 1 View  Result Date: 12/02/2021 CLINICAL DATA:  Hip fracture EXAM: CHEST  1 VIEW COMPARISON:  08/24/2011 FINDINGS: The heart size and mediastinal contours are within normal limits. Both lungs are clear. The visualized skeletal structures are unremarkable. IMPRESSION: No active disease. Electronically Signed   By: Donavan Foil M.D.   On: 12/02/2021 21:12   DG Hip Unilat W or Wo Pelvis 2-3 Views Left  Result Date: 12/02/2021 CLINICAL DATA:  Left hip pain. EXAM: DG HIP (WITH OR WITHOUT PELVIS) 2-3V LEFT COMPARISON:  None. FINDINGS: Bilateral femoral head replacements. There is apparent fracture of the superior aspect of the left femoral neck. There is proximal migration of the femoral shaft in relation to the arthroplasty. No dislocation. The bones are osteopenic. Degenerative changes of the lower lumbar spine.  The soft tissues are unremarkable. IMPRESSION: Mildly displaced left femoral neck fracture. No dislocation. Electronically Signed   By: Anner Crete M.D.   On: 12/02/2021 20:39      Assessment/Plan  Principal Problem:   Closed left hip fracture [facility]) Active Problems:   Left hip pain   Fall at home, initial encounter     #) Acute left femoral neck fracture: confirmed via presenting plain films and stemming from ground level mechanical fall without associated loss of consciousness that occurred earlier on the day of admission, as further described above, resulting in immediate develop of acute left hip pain.  At this time, the left lower extremity appears neurovascularly intact, and patient reports adequate pain control. Not on any blood thinners at home, including no aspirin.   EDP discussed the patient's case and imaging with the on-call orthopedic surgeon, Dr. Lynann Bologna, who recommended admission to the hospitalist service for further evaluation and management of acute left hip fracture.  Dr. Lynann Bologna to speak with Orthopedic practice to determine which orthopedic surgeon to evaluate the patient in the morning, with plan for surgery later today.  He requests patient be made n.p.o. after midnight.  Will check pre-op ekg. However, No evidence at this time to suggest acutely decompensated heart failure or acute MI. Consequently, no absolute contraindications to proceeding with proposed orthopedic surgery at this time.    Plan: Formal orthopedic surgery consult for definitive surgical management, with plan to take patient to the OR tomorrow (12/30). NPO after MN in anticipation of this procedure.  No pharmacologic anticoagulation leading up to this anticipated surgery. SCD's. Prn IV dilaudid. Anticipate postoperative PT consult. Check INR. Pre-op EKG has been ordered. Check 25-hydroxy vit D level.        #) Ground level mechanical fall: The patient reports a ground level mechanical  fall earlier today in which she tripped without any associated loss of consciousness. Appears to be purely mechanical in nature, without clinical evidence at this time to suggest contributory dizziness, presyncope, syncope, or acute ischemic CVA. Does not appear to have hit head as component of this fall. While this fall appears to be purely mechanical in nature, will also check urinalysis to evaluate for any underlying infectious contribution.   Plan: Check urinalysis, as above.  Repeat BMP and CBC with differential in the morning. Fall precautions. Anticipate postoperative PT consult.         DVT prophylaxis: SCD's   Code Status: Full code Family Communication: none Disposition Plan: Per Rounding Team Consults called: on-call ortho, as above;  Admission status: inpatient, med-surg   PLEASE NOTE THAT DRAGON DICTATION SOFTWARE WAS USED IN THE CONSTRUCTION OF THIS NOTE.   [place] DO [facility] [place]   12/02/2021, 10:46 PM

## 2021-12-03 ENCOUNTER — Inpatient Hospital Stay (HOSPITAL_COMMUNITY): Payer: 59

## 2021-12-03 ENCOUNTER — Ambulatory Visit: Payer: 59 | Admitting: Family Medicine

## 2021-12-03 ENCOUNTER — Encounter (HOSPITAL_COMMUNITY): Payer: Self-pay | Admitting: Internal Medicine

## 2021-12-03 DIAGNOSIS — M25552 Pain in left hip: Secondary | ICD-10-CM | POA: Diagnosis present

## 2021-12-03 DIAGNOSIS — M9702XD Periprosthetic fracture around internal prosthetic left hip joint, subsequent encounter: Secondary | ICD-10-CM

## 2021-12-03 DIAGNOSIS — Z96649 Presence of unspecified artificial hip joint: Secondary | ICD-10-CM

## 2021-12-03 DIAGNOSIS — S72002P Fracture of unspecified part of neck of left femur, subsequent encounter for closed fracture with malunion: Secondary | ICD-10-CM

## 2021-12-03 DIAGNOSIS — Y92009 Unspecified place in unspecified non-institutional (private) residence as the place of occurrence of the external cause: Secondary | ICD-10-CM

## 2021-12-03 LAB — HIV ANTIBODY (ROUTINE TESTING W REFLEX): HIV Screen 4th Generation wRfx: NONREACTIVE

## 2021-12-03 LAB — CBC WITH DIFFERENTIAL/PLATELET
Abs Immature Granulocytes: 0.02 10*3/uL (ref 0.00–0.07)
Basophils Absolute: 0.1 10*3/uL (ref 0.0–0.1)
Basophils Relative: 1 %
Eosinophils Absolute: 0 10*3/uL (ref 0.0–0.5)
Eosinophils Relative: 1 %
HCT: 39.1 % (ref 36.0–46.0)
Hemoglobin: 13.1 g/dL (ref 12.0–15.0)
Immature Granulocytes: 0 %
Lymphocytes Relative: 21 %
Lymphs Abs: 1.7 10*3/uL (ref 0.7–4.0)
MCH: 28.2 pg (ref 26.0–34.0)
MCHC: 33.5 g/dL (ref 30.0–36.0)
MCV: 84.1 fL (ref 80.0–100.0)
Monocytes Absolute: 0.7 10*3/uL (ref 0.1–1.0)
Monocytes Relative: 8 %
Neutro Abs: 5.5 10*3/uL (ref 1.7–7.7)
Neutrophils Relative %: 69 %
Platelets: 214 10*3/uL (ref 150–400)
RBC: 4.65 MIL/uL (ref 3.87–5.11)
RDW: 13.9 % (ref 11.5–15.5)
WBC: 8 10*3/uL (ref 4.0–10.5)
nRBC: 0 % (ref 0.0–0.2)

## 2021-12-03 LAB — VITAMIN D 25 HYDROXY (VIT D DEFICIENCY, FRACTURES): Vit D, 25-Hydroxy: 55.6 ng/mL (ref 30–100)

## 2021-12-03 LAB — COMPREHENSIVE METABOLIC PANEL
ALT: 20 U/L (ref 0–44)
AST: 23 U/L (ref 15–41)
Albumin: 4.3 g/dL (ref 3.5–5.0)
Alkaline Phosphatase: 63 U/L (ref 38–126)
Anion gap: 5 (ref 5–15)
BUN: 11 mg/dL (ref 8–23)
CO2: 25 mmol/L (ref 22–32)
Calcium: 8.7 mg/dL — ABNORMAL LOW (ref 8.9–10.3)
Chloride: 106 mmol/L (ref 98–111)
Creatinine, Ser: 0.51 mg/dL (ref 0.44–1.00)
GFR, Estimated: >60 mL/min (ref >60)
Glucose, Bld: 104 mg/dL — ABNORMAL HIGH (ref 70–99)
Potassium: 3.6 mmol/L (ref 3.5–5.1)
Sodium: 136 mmol/L (ref 135–145)
Total Bilirubin: 0.7 mg/dL (ref 0.3–1.2)
Total Protein: 6.9 g/dL (ref 6.5–8.1)

## 2021-12-03 LAB — ABO/RH: ABO/RH(D): O NEG

## 2021-12-03 LAB — MAGNESIUM: Magnesium: 2.4 mg/dL (ref 1.7–2.4)

## 2021-12-03 MED ORDER — OXYCODONE-ACETAMINOPHEN 5-325 MG PO TABS
1.0000 | ORAL_TABLET | ORAL | Status: DC | PRN
Start: 2021-12-03 — End: 2021-12-08
  Administered 2021-12-04: 1 via ORAL
  Filled 2021-12-03: qty 1

## 2021-12-03 MED ORDER — HYDROMORPHONE HCL 1 MG/ML IJ SOLN
0.5000 mg | INTRAMUSCULAR | Status: DC | PRN
  Administered 2021-12-03 (×3): 0.5 mg via INTRAVENOUS
  Filled 2021-12-03 (×5): qty 0.5

## 2021-12-03 MED ORDER — CYCLOBENZAPRINE HCL 5 MG PO TABS
5.0000 mg | ORAL_TABLET | Freq: Three times a day (TID) | ORAL | Status: DC | PRN
  Administered 2021-12-03 – 2021-12-04 (×5): 5 mg via ORAL
  Filled 2021-12-03 (×6): qty 1

## 2021-12-03 NOTE — H&P (View-Only) (Signed)
Reason for Consult:Left hip fx Referring Physician: Irine Seal Time called: 8127 Time at bedside: 1149   Isabel [name] is an 65 y.o. female.  HPI: Isabel [name] was walking when she heard a pop in her left hip and had immediate pain and could no longer bear weight. She notes the hip has been popping intermittently for the past year and hurting for the past 2 weeks. It was bad enough that she had started to use a cane. She was brought to the ED where x-rays showed a femoral neck fx complicated by a prior hip resurfacing and orthopedic surgery was consulted. CT done as well showing periarticular fluid collection and propagation of fracture to subtrochanteric region of the femur.  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   Breast cancer ([place])    sees onc and gen surg    Mitral regurgitation    mild on echo from 2009, no MVP per notes   Osteoarthritis    w/ bilat hip resurfacing and possible metal reaction, sees ortho at [facility]   Scoliosis    Uterine fibroid    Varicose veins    goes to [place] [facility], s/p laser and inj tx    Past Surgical History:  Procedure Laterality Date   BREAST LUMPECTOMY  09/12   left breast   GANGLION CYST EXCISION     left hip resurfaced 2011     right hip resurfaced 2008     unterine fibroid  1998   removal    Family History  Problem Relation Age of Onset   Stroke Mother 58       hemorrhagic   Other Mother        pagets disease breast   Arthritis Mother        foot, knee problems   Hypertension Father    Hyperlipidemia Father    CAD Father 6       Triple bypass; died age 64   Arthritis Father    Dementia Father    Breast cancer Maternal Aunt    Esophageal cancer Maternal Grandfather 73   Dementia Paternal Grandmother    Heart disease Paternal Grandfather 78       multiple heart attacks   Arthritis Paternal Uncle    Colon cancer Neg Hx    Stomach cancer Neg Hx    Thyroid disease  Neg Hx     Social History:  reports that she has never smoked. She has never used smokeless tobacco. She reports current alcohol use of about 7.0 standard drinks per week. She reports that she does not use drugs.  Allergies:  Allergies  Allergen Reactions   Erythromycin Diarrhea and Nausea Only   Latex     Sores in mouth    Medications: I have reviewed the patient's current medications.  Results for orders placed or performed during the hospital encounter of 12/02/21 (from the past 48 hour(s))  Basic metabolic panel     Status: Abnormal   Collection Time: 12/02/21  8:43 PM  Result Value Ref Range   Sodium 138 135 - 145 mmol/L   Potassium 3.6 3.5 - 5.1 mmol/L   Chloride 105 98 - 111 mmol/L   CO2 21 (L) 22 - 32 mmol/L   Glucose, Bld 101 (H) 70 - 99 mg/dL    Comment: Glucose reference range applies only to samples taken after fasting for at least 8 hours.   BUN 14 8 - 23  mg/dL   Creatinine, Ser 0.48 0.44 - 1.00 mg/dL   Calcium 9.4 8.9 - 10.3 mg/dL   GFR, Estimated >60 >60 mL/min    Comment: (NOTE) Calculated using the CKD-EPI Creatinine Equation (2021)    Anion gap 12 5 - 15    Comment: Performed at [facility], [place] [address], [place], [place] [address]  CBC WITH DIFFERENTIAL     Status: None   Collection Time: 12/02/21  8:43 PM  Result Value Ref Range   WBC 7.5 4.0 - 10.5 K/uL   RBC 4.68 3.87 - 5.11 MIL/uL   Hemoglobin 13.0 12.0 - 15.0 g/dL   HCT 39.9 36.0 - 46.0 %   MCV 85.3 80.0 - 100.0 fL   MCH 27.8 26.0 - 34.0 pg   MCHC 32.6 30.0 - 36.0 g/dL   RDW 14.0 11.5 - 15.5 %   Platelets 231 150 - 400 K/uL   nRBC 0.0 0.0 - 0.2 %   Neutrophils Relative % 66 %   Neutro Abs 4.9 1.7 - 7.7 K/uL   Lymphocytes Relative 23 %   Lymphs Abs 1.7 0.7 - 4.0 K/uL   Monocytes Relative 9 %   Monocytes Absolute 0.7 0.1 - 1.0 K/uL   Eosinophils Relative 1 %   Eosinophils Absolute 0.1 0.0 - 0.5 K/uL   Basophils Relative 1 %   Basophils Absolute 0.0 0.0 - 0.1 K/uL    Immature Granulocytes 0 %   Abs Immature Granulocytes 0.03 0.00 - 0.07 K/uL    Comment: Performed at [facility], [place] [address], [place], [place] [address]  Protime-INR     Status: None   Collection Time: 12/02/21  8:43 PM  Result Value Ref Range   Prothrombin Time 12.8 11.4 - 15.2 seconds   INR 1.0 0.8 - 1.2    Comment: (NOTE) INR goal varies based on device and disease states. Performed at [facility], [place] [address] [place], [place] [address]   Type and screen [place]     Status: None   Collection Time: 12/02/21  8:43 PM  Result Value Ref Range   ABO/RH(D) O NEG    Antibody Screen NEG    Sample Expiration      12/05/2021,2359 Performed at [facility], [place] [address], [place], [place] [address]   Resp Panel by RT-PCR (Flu A&B, Covid) Nasopharyngeal Swab     Status: None   Collection Time: 12/02/21  9:36 PM   Specimen: Nasopharyngeal Swab; Nasopharyngeal(NP) swabs in vial transport medium  Result Value Ref Range   SARS Coronavirus 2 by RT PCR NEGATIVE NEGATIVE    Comment: (NOTE) SARS-CoV-2 target nucleic acids are NOT DETECTED.  The SARS-CoV-2 RNA is generally detectable in upper respiratory specimens during the acute phase of infection. The lowest concentration of SARS-CoV-2 viral copies this assay can detect is 138 copies/mL. A negative result does not preclude SARS-Cov-2 infection and should not be used as the sole basis for treatment or other patient management decisions. A negative result may occur with  improper specimen collection/handling, submission of specimen other than nasopharyngeal swab, presence of viral mutation(s) within the areas targeted by this assay, and inadequate number of viral copies(<138 copies/mL). A negative result must be combined with clinical observations, patient history, and epidemiological information. The expected result is Negative.  Fact Sheet for  Patients:  EntrepreneurPulse.com.au  Fact Sheet for Healthcare Providers:  IncredibleEmployment.be  This test is no t yet approved or  cleared by the [place] and  has been authorized for detection and/or diagnosis of SARS-CoV-2 by FDA under an Emergency Use Authorization (EUA). This EUA will remain  in effect (meaning this test can be used) for the duration of the COVID-19 declaration under Section 564(b)(1) of the Act, 21 U.S.C.section 360bbb-3(b)(1), unless the authorization is terminated  or revoked sooner.       Influenza A by PCR NEGATIVE NEGATIVE   Influenza B by PCR NEGATIVE NEGATIVE    Comment: (NOTE) The Xpert Xpress SARS-CoV-2/FLU/RSV plus assay is intended as an aid in the diagnosis of influenza from Nasopharyngeal swab specimens and should not be used as a sole basis for treatment. Nasal washings and aspirates are unacceptable for Xpert Xpress SARS-CoV-2/FLU/RSV testing.  Fact Sheet for Patients: EntrepreneurPulse.com.au  Fact Sheet for Healthcare Providers: IncredibleEmployment.be  This test is not yet approved or cleared by the [place] FDA and has been authorized for detection and/or diagnosis of SARS-CoV-2 by FDA under an Emergency Use Authorization (EUA). This EUA will remain in effect (meaning this test can be used) for the duration of the COVID-19 declaration under Section 564(b)(1) of the Act, 21 U.S.C. section 360bbb-3(b)(1), unless the authorization is terminated or revoked.  Performed at [facility], [place] [address] [place], [place] [address]   ABO/Rh     Status: None   Collection Time: 12/03/21  1:01 AM  Result Value Ref Range   ABO/RH(D)      Jenetta Downer NEG Performed at [place] [address] [place], [place] [address]   HIV Antibody (routine testing w rflx)     Status: None   Collection Time: 12/03/21  1:01 AM  Result  Value Ref Range   HIV Screen 4th Generation wRfx Non Reactive Non Reactive    Comment: Performed at [facility], [place] [address], [place], [place] [address]  Magnesium     Status: None   Collection Time: 12/03/21  1:01 AM  Result Value Ref Range   Magnesium 2.4 1.7 - 2.4 mg/dL    Comment: Performed at [facility], [place] [address], [place], [place] [address]  Comprehensive metabolic panel     Status: Abnormal   Collection Time: 12/03/21  1:01 AM  Result Value Ref Range   Sodium 136 135 - 145 mmol/L   Potassium 3.6 3.5 - 5.1 mmol/L   Chloride 106 98 - 111 mmol/L   CO2 25 22 - 32 mmol/L   Glucose, Bld 104 (H) 70 - 99 mg/dL    Comment: Glucose reference range applies only to samples taken after fasting for at least 8 hours.   BUN 11 8 - 23 mg/dL   Creatinine, Ser 0.51 0.44 - 1.00 mg/dL   Calcium 8.7 (L) 8.9 - 10.3 mg/dL   Total Protein 6.9 6.5 - 8.1 g/dL   Albumin 4.3 3.5 - 5.0 g/dL   AST 23 15 - 41 U/L   ALT 20 0 - 44 U/L   Alkaline Phosphatase 63 38 - 126 U/L   Total Bilirubin 0.7 0.3 - 1.2 mg/dL   GFR, Estimated >60 >60 mL/min    Comment: (NOTE) Calculated using the CKD-EPI Creatinine Equation (2021)    Anion gap 5 5 - 15    Comment: Performed at [facility], [place] [address], [place], [place] [address]  CBC with Differential/Platelet     Status: None   Collection Time: 12/03/21  1:01 AM  Result Value Ref  Range   WBC 8.0 4.0 - 10.5 K/uL   RBC 4.65 3.87 - 5.11 MIL/uL   Hemoglobin 13.1 12.0 - 15.0 g/dL   HCT 39.1 36.0 - 46.0 %   MCV 84.1 80.0 - 100.0 fL   MCH 28.2 26.0 - 34.0 pg   MCHC 33.5 30.0 - 36.0 g/dL   RDW 13.9 11.5 - 15.5 %   Platelets 214 150 - 400 K/uL   nRBC 0.0 0.0 - 0.2 %   Neutrophils Relative % 69 %   Neutro Abs 5.5 1.7 - 7.7 K/uL   Lymphocytes Relative 21 %   Lymphs Abs 1.7 0.7 - 4.0 K/uL   Monocytes Relative 8 %   Monocytes Absolute 0.7 0.1 - 1.0 K/uL   Eosinophils Relative 1 %   Eosinophils Absolute 0.0 0.0  - 0.5 K/uL   Basophils Relative 1 %   Basophils Absolute 0.1 0.0 - 0.1 K/uL   Immature Granulocytes 0 %   Abs Immature Granulocytes 0.02 0.00 - 0.07 K/uL    Comment: Performed at [facility], [place] [address], [place], [place] [address]  VITAMIN D 25 Hydroxy (Vit-D Deficiency, Fractures)     Status: None   Collection Time: 12/03/21  6:34 AM  Result Value Ref Range   Vit D, 25-Hydroxy 55.60 30 - 100 ng/mL    Comment: (NOTE) Vitamin D deficiency has been defined by the [place] practice guideline as a level of serum 25-OH  vitamin D less than 20 ng/mL (1,2). The Endocrine Society went on to  further define vitamin D insufficiency as a level between 21 and 29  ng/mL (2).  1. IOM (Institute of Medicine). 2010. Dietary reference intakes for  calcium and D. [place]: The [employer]. 2. Holick MF, Binkley Matador, Bischoff-Ferrari HA, et al. Evaluation,  treatment, and prevention of vitamin D deficiency: an Endocrine  Society clinical practice guideline, JCEM. 2011 Jul; 96(7): 1911-30.  Performed at [facility], [place] [address], [place], [place] [address]     DG Chest 1 View  Result Date: 12/02/2021 CLINICAL DATA:  Hip fracture EXAM: CHEST  1 VIEW COMPARISON:  08/24/2011 FINDINGS: The heart size and mediastinal contours are within normal limits. Both lungs are clear. The visualized skeletal structures are unremarkable. IMPRESSION: No active disease. Electronically Signed   By: Donavan Foil M.D.   On: 12/02/2021 21:12   DG Knee Left Port  Result Date: 12/03/2021 CLINICAL DATA:  Hip fracture EXAM: PORTABLE LEFT KNEE - 1-2 VIEW COMPARISON:  None. FINDINGS: There is no acute fracture or dislocation. Knee alignment is normal. There is mild tricompartmental joint space narrowing with associated osteophytosis. A small ossific fragment within the knee joint may reflect a loose body. The soft tissues are unremarkable.  There is no effusion. IMPRESSION: No acute fracture or dislocation. Electronically Signed   By: Valetta Mole M.D.   On: 12/03/2021 12:04   DG Hip Unilat W or Wo Pelvis 2-3 Views Left  Result Date: 12/02/2021 CLINICAL DATA:  Left hip pain. EXAM: DG HIP (WITH OR WITHOUT PELVIS) 2-3V LEFT COMPARISON:  None. FINDINGS: Bilateral femoral head replacements. There is apparent fracture of the superior aspect of the left femoral neck. There is proximal migration of the femoral shaft in relation to the arthroplasty. No dislocation. The bones are osteopenic. Degenerative changes of the lower lumbar spine. The soft tissues are unremarkable. IMPRESSION: Mildly displaced left femoral neck fracture. No dislocation. Electronically Signed  By: Anner Crete M.D.   On: 12/02/2021 20:39    Review of Systems  HENT:  Negative for ear discharge, ear pain, hearing loss and tinnitus.   Eyes:  Negative for photophobia and pain.  Respiratory:  Negative for cough and shortness of breath.   Cardiovascular:  Negative for chest pain.  Gastrointestinal:  Negative for abdominal pain, nausea and vomiting.  Genitourinary:  Negative for dysuria, flank pain, frequency and urgency.  Musculoskeletal:  Positive for arthralgias (Left hip). Negative for back pain, myalgias and neck pain.  Neurological:  Negative for dizziness and headaches.  Hematological:  Does not bruise/bleed easily.  Psychiatric/Behavioral:  The patient is not nervous/anxious.   Blood pressure 126/69, pulse 76, temperature 99 F (37.2 C), resp. rate 17, height 5\' 6"  (1.676 m), weight 65.1 kg, last menstrual period 12/05/2012, SpO2 99 %. Physical Exam Constitutional:      General: She is not in acute distress.    Appearance: She is well-developed. She is not diaphoretic.  HENT:     Head: Normocephalic and atraumatic.  Eyes:     General: No scleral icterus.       Right eye: No discharge.        Left eye: No discharge.     Conjunctiva/sclera: Conjunctivae  normal.  Cardiovascular:     Rate and Rhythm: Normal rate and regular rhythm.  Pulmonary:     Effort: Pulmonary effort is normal. No respiratory distress.  Musculoskeletal:     Cervical back: Normal range of motion.     Comments: LLE No traumatic wounds, ecchymosis, or rash  Nontender  No knee or ankle effusion  Knee stable to varus/ valgus and anterior/posterior stress  Sens DPN, SPN, TN intact  Motor EHL, ext, flex, evers 5/5  DP 2+, PT 1+, No significant edema  Skin:    General: Skin is warm and dry.  Neurological:     Mental Status: She is alert.  Psychiatric:        Mood and Affect: Mood normal.        Behavior: Behavior normal.    Assessment/Plan: Left hip fx -- Plan THA revision Wednesday with Dr. Lyla Glassing. At pt's request I called [facility] and spoke to Jaymes Graff who was unable to complete surgery in a more timely fashion as they have no open orthopedic beds.    Isabel Abu, PA-C Orthopedic Surgery [phone] 12/03/2021, 12:42 PM     Bertram Savin, MD 12/08/21

## 2021-12-03 NOTE — Progress Notes (Signed)
PROGRESS NOTE    Isabel [name]  NWG:[id number] DOB: [birth year] DOA: 12/02/2021 PCP: Caren Macadam, MD    Chief Complaint  Patient presents with   Hip Pain    Brief Narrative: Patient is a 65 year old female history of osteoarthritis with bilateral hip resurfacing and possible metal reaction, history of breast cancer presenting to the ED when walking felt a pop in the left hip with immediate pain and inability to bear weight.  Patient denies any fall.  No syncopal episode.  No chest pain.  Patient seen in the ED plain films and consistent with a left femoral neck fracture.  Patient admitted.  Orthopedics consulted for further evaluation.   Assessment & Plan:   Principal Problem:   Closed left hip fracture [facility]) Active Problems:   Left hip pain   Fall at home, initial encounter   #1 closed left hip femoral neck fracture -Patient states was ambulating and walking and placed too much pressure on her left lower extremity heard a pop with immediate pain with inability to bear weight. -Plain films consistent with left femoral neck fracture. 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  Commuted left femoral neck fracture with a fracture cleft extending along the medial aspect into the proximal diaphysis. -Preop EKG with normal sinus rhythm. -Patient with no chest pain.  No history of coronary artery disease. -Patient did state had been having some intermittent palpitations and was scheduled for outpatient 2D echo. -Check a 2D echo. -Patient seen in consultation by orthopedics who planning THA revision on Wednesday per Dr. Lyla Glassing. -It is noted by orthopedics note that they called  [facility] at patient's request and spoke to Daisey Must who was unable to complete surgery in a more timely fashion as no orthopedics beds available. -Per orthopedics.  2.  Fall ruled out.   DVT prophylaxis: SCDs preoperative Code Status: Full Family Communication: Updated patient.  No family at bedside. Disposition:   Status is: Inpatient  Remains inpatient appropriate because: Severity of illness.       Consultants:  Orthopedics  Procedures:  CT left hip 12/03/2021 Plain films of the left knee 12/03/2021 Chest x-ray 12/02/2021 Plain films of the left hip and pelvis 12/02/2021.   Antimicrobials:  None   Subjective: Laying in bed.  Just returned from CT.  States was walking and placed a lot of weight on her left leg felt a pop with immediate pain and inability to bear weight.  No chest pain.  No shortness of breath.  States was having some intermittent palpitations and scheduled for 2D echo in the outpatient setting.  No prior history of chest pain or cardiac history.  Objective: Vitals:   12/03/21 0458 12/03/21 0500 12/03/21 0939 12/03/21 1342  BP: 125/65  126/69 (!) 144/71  Pulse: 73  76 73  Resp: 18  17 16   Temp: 97.9 F (36.6 C)  99 F (37.2 C) 98 F (36.7 C)  TempSrc: Oral     SpO2: 97%  99% 100%  Weight:  65.1 kg    Height:        Intake/Output Summary (Last 24 hours) at 12/03/2021 1735 Last data filed at 12/03/2021 1400 Gross per 24 hour  Intake 240 ml  Output 900 ml  Net -660 ml   Filed Weights   12/02/21 1958 12/03/21 0500  Weight: 65.8 kg 65.1 kg    Examination:  General exam: Appears calm and comfortable  Respiratory system: Clear to auscultation anterior lung fields.  No wheezes, no crackles, no rhonchi.Marland Kitchen Respiratory effort normal. Cardiovascular system: S1 & S2 heard, RRR. No JVD, murmurs, rubs, gallops or clicks. No pedal edema. Gastrointestinal system: Abdomen is nondistended, soft and nontender. No organomegaly or masses felt. Normal  bowel sounds heard. Central nervous system: Alert and oriented. No focal neurological deficits. Extremities: Left lower extremity shortened and externally rotated.  Nontender to palpation on the left hip area.  Skin: No rashes, lesions or ulcers Psychiatry: Judgement and insight appear normal. Mood & affect appropriate.     Data Reviewed: I have personally reviewed following labs and imaging studies  CBC: Recent Labs  Lab 12/02/21 2043 12/03/21 0101  WBC 7.5 8.0  NEUTROABS 4.9 5.5  HGB 13.0 13.1  HCT 39.9 39.1  MCV 85.3 84.1  PLT 231 505    Basic Metabolic Panel: Recent Labs  Lab 12/02/21 2043 12/03/21 0101  NA 138 136  K 3.6 3.6  CL 105 106  CO2 21* 25  GLUCOSE 101* 104*  BUN 14 11  CREATININE 0.48 0.51  CALCIUM 9.4 8.7*  MG  --  2.4    GFR: Estimated Creatinine Clearance: 67.4 mL/min (by C-G formula based on SCr of 0.51 mg/dL).  Liver Function Tests: Recent Labs  Lab 12/03/21 0101  AST 23  ALT 20  ALKPHOS 63  BILITOT 0.7  PROT 6.9  ALBUMIN 4.3    CBG: No results for input(s): GLUCAP in the last 168 hours.   Recent Results (from the past 240 hour(s))  Resp Panel by RT-PCR (Flu A&B, Covid) Nasopharyngeal Swab     Status: None   Collection Time: 12/02/21  9:36 PM   Specimen: Nasopharyngeal Swab; Nasopharyngeal(NP) swabs in vial transport medium  Result Value Ref Range Status   SARS Coronavirus 2 by RT PCR NEGATIVE NEGATIVE Final    Comment: (NOTE) SARS-CoV-2 target nucleic acids are NOT DETECTED.  The SARS-CoV-2 RNA is generally detectable in upper respiratory specimens during the acute phase of infection. The lowest concentration of SARS-CoV-2 viral copies this assay can detect is 138 copies/mL. A negative result does not preclude SARS-Cov-2 infection and should not be used as the sole basis for treatment or other patient management decisions. A negative result may occur with  improper specimen collection/handling, submission of specimen  other than nasopharyngeal swab, presence of viral mutation(s) within the areas targeted by this assay, and inadequate number of viral copies(<138 copies/mL). A negative result must be combined with clinical observations, patient history, and epidemiological information. The expected result is Negative.  Fact Sheet for Patients:  EntrepreneurPulse.com.au  Fact Sheet for Healthcare Providers:  IncredibleEmployment.be  This test is no t yet approved or cleared by the [place] FDA and  has been authorized for detection and/or diagnosis of SARS-CoV-2 by FDA under an Emergency Use Authorization (EUA). This EUA will remain  in effect (meaning this test can be used) for the duration of the COVID-19 declaration under Section 564(b)(1) of the Act, 21 U.S.C.section 360bbb-3(b)(1), unless the authorization is terminated  or revoked sooner.       Influenza A by PCR NEGATIVE NEGATIVE Final   Influenza B by PCR NEGATIVE NEGATIVE Final    Comment: (NOTE) The Xpert Xpress SARS-CoV-2/FLU/RSV plus assay is intended as an aid in the diagnosis  of influenza from Nasopharyngeal swab specimens and should not be used as a sole basis for treatment. Nasal washings and aspirates are unacceptable for Xpert Xpress SARS-CoV-2/FLU/RSV testing.  Fact Sheet for Patients: EntrepreneurPulse.com.au  Fact Sheet for Healthcare Providers: IncredibleEmployment.be  This test is not yet approved or cleared by the [place] FDA and has been authorized for detection and/or diagnosis of SARS-CoV-2 by FDA under an Emergency Use Authorization (EUA). This EUA will remain in effect (meaning this test can be used) for the duration of the COVID-19 declaration under Section 564(b)(1) of the Act, 21 U.S.C. section 360bbb-3(b)(1), unless the authorization is terminated or revoked.  Performed at [facility], [place] [address] [place], [place] [address]          Radiology Studies: DG Chest 1 View  Result Date: 12/02/2021 CLINICAL DATA:  Hip fracture EXAM: CHEST  1 VIEW COMPARISON:  08/24/2011 FINDINGS: The heart size and mediastinal contours are within normal limits. Both lungs are clear. The visualized skeletal structures are unremarkable. IMPRESSION: No active disease. Electronically Signed   By: Donavan Foil M.D.   On: 12/02/2021 21:12   CT HIP LEFT WO CONTRAST  Result Date: 12/03/2021 CLINICAL DATA:  Surgical planning.  Surgical planning. EXAM: CT OF THE LEFT HIP WITHOUT CONTRAST TECHNIQUE: Multidetector CT imaging of the left hip was performed according to the standard protocol. Multiplanar CT image reconstructions were also generated. COMPARISON:  None. FINDINGS: Bones/Joint/Cartilage Left total hip arthroplasty. 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 Smaller fluid collection extending from the inferior joint space measuring 3.4 x 1.6 cm. Comminuted left femoral neck fracture with a fracture cleft extending along the medial aspect into the proximal diaphysis. No other fracture or dislocation. Mild osteoarthritis of the left SI joint. No joint effusion. Ligaments Ligaments are suboptimally evaluated by CT. Muscles and Tendons No muscle atrophy. Soft tissue No fluid collection or hematoma.  No soft tissue mass. IMPRESSION: 1. Left total hip arthroplasty. 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 Smaller fluid collection extending from the inferior joint space measuring 3.4 x 1.6 cm also concerning for particle disease. 2. Comminuted left femoral neck fracture with a fracture cleft extending along the medial  aspect into the proximal diaphysis. Electronically Signed   By: Kathreen Devoid M.D.   On: 12/03/2021 14:56   DG Knee Left Port  Result Date: 12/03/2021 CLINICAL DATA:  Hip fracture EXAM: PORTABLE LEFT KNEE - 1-2 VIEW COMPARISON:  None. FINDINGS: There is no acute fracture or dislocation. Knee alignment is normal. There is mild tricompartmental joint space narrowing with associated osteophytosis. A small ossific fragment within the knee joint may reflect a loose body. The soft tissues are unremarkable. There is no effusion. IMPRESSION: No acute fracture or dislocation. Electronically Signed   By: Valetta Mole M.D.   On: 12/03/2021 12:04   DG Hip Unilat W or Wo Pelvis 2-3 Views Left  Result Date: 12/02/2021 CLINICAL DATA:  Left hip pain. EXAM: DG HIP (WITH OR WITHOUT PELVIS) 2-3V LEFT COMPARISON:  None. FINDINGS: Bilateral femoral head replacements. There is apparent fracture of the superior aspect of the left femoral neck. There is proximal migration of the femoral shaft in relation to the arthroplasty. No dislocation.  The bones are osteopenic. Degenerative changes of the lower lumbar spine. The soft tissues are unremarkable. IMPRESSION: Mildly displaced left femoral neck fracture. No dislocation. Electronically Signed   By: Anner Crete M.D.   On: 12/02/2021 20:39        Scheduled Meds: Continuous Infusions:   LOS: 1 day    Time spent: 40 minutes    Irine Seal, MD [facility]   To contact the attending provider between 7A-7P or the covering provider during after hours 7P-7A, please log into the web site [email or web address] and access using universal [place] password for that web site. If you do not have the password, please call the hospital operator.  12/03/2021, 5:35 PM

## 2021-12-03 NOTE — Consult Note (Addendum)
Reason for Consult:Left hip fx Referring Physician: Irine Seal Time called: 9735 Time at bedside: 1149   Isabel [name] is an 65 y.o. female.  HPI: Tawania was walking when she heard a pop in her left hip and had immediate pain and could no longer bear weight. She notes the hip has been popping intermittently for the past year and hurting for the past 2 weeks. It was bad enough that she had started to use a cane. She was brought to the ED where x-rays showed a femoral neck fx complicated by a prior hip resurfacing and orthopedic surgery was consulted. CT done as well showing periarticular fluid collection and propagation of fracture to subtrochanteric region of the femur.  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   Breast cancer ([place])    sees onc and gen surg    Mitral regurgitation    mild on echo from 2009, no MVP per notes   Osteoarthritis    w/ bilat hip resurfacing and possible metal reaction, sees ortho at [facility]   Scoliosis    Uterine fibroid    Varicose veins    goes to [place] [facility], s/p laser and inj tx    Past Surgical History:  Procedure Laterality Date   BREAST LUMPECTOMY  09/12   left breast   GANGLION CYST EXCISION     left hip resurfaced 2011     right hip resurfaced 2008     unterine fibroid  1998   removal    Family History  Problem Relation Age of Onset   Stroke Mother 10       hemorrhagic   Other Mother        pagets disease breast   Arthritis Mother        foot, knee problems   Hypertension Father    Hyperlipidemia Father    CAD Father 83       Triple bypass; died age 59   Arthritis Father    Dementia Father    Breast cancer Maternal Aunt    Esophageal cancer Maternal Grandfather 52   Dementia Paternal Grandmother    Heart disease Paternal Grandfather 78       multiple heart attacks   Arthritis Paternal Uncle    Colon cancer Neg Hx    Stomach cancer Neg Hx    Thyroid disease  Neg Hx     Social History:  reports that she has never smoked. She has never used smokeless tobacco. She reports current alcohol use of about 7.0 standard drinks per week. She reports that she does not use drugs.  Allergies:  Allergies  Allergen Reactions   Erythromycin Diarrhea and Nausea Only   Latex     Sores in mouth    Medications: I have reviewed the patient's current medications.  Results for orders placed or performed during the hospital encounter of 12/02/21 (from the past 48 hour(s))  Basic metabolic panel     Status: Abnormal   Collection Time: 12/02/21  8:43 PM  Result Value Ref Range   Sodium 138 135 - 145 mmol/L   Potassium 3.6 3.5 - 5.1 mmol/L   Chloride 105 98 - 111 mmol/L   CO2 21 (L) 22 - 32 mmol/L   Glucose, Bld 101 (H) 70 - 99 mg/dL    Comment: Glucose reference range applies only to samples taken after fasting for at least 8 hours.   BUN 14 8 - 23  mg/dL   Creatinine, Ser 0.48 0.44 - 1.00 mg/dL   Calcium 9.4 8.9 - 10.3 mg/dL   GFR, Estimated >60 >60 mL/min    Comment: (NOTE) Calculated using the CKD-EPI Creatinine Equation (2021)    Anion gap 12 5 - 15    Comment: Performed at [facility], [place] [address], [place], [place] [address]  CBC WITH DIFFERENTIAL     Status: None   Collection Time: 12/02/21  8:43 PM  Result Value Ref Range   WBC 7.5 4.0 - 10.5 K/uL   RBC 4.68 3.87 - 5.11 MIL/uL   Hemoglobin 13.0 12.0 - 15.0 g/dL   HCT 39.9 36.0 - 46.0 %   MCV 85.3 80.0 - 100.0 fL   MCH 27.8 26.0 - 34.0 pg   MCHC 32.6 30.0 - 36.0 g/dL   RDW 14.0 11.5 - 15.5 %   Platelets 231 150 - 400 K/uL   nRBC 0.0 0.0 - 0.2 %   Neutrophils Relative % 66 %   Neutro Abs 4.9 1.7 - 7.7 K/uL   Lymphocytes Relative 23 %   Lymphs Abs 1.7 0.7 - 4.0 K/uL   Monocytes Relative 9 %   Monocytes Absolute 0.7 0.1 - 1.0 K/uL   Eosinophils Relative 1 %   Eosinophils Absolute 0.1 0.0 - 0.5 K/uL   Basophils Relative 1 %   Basophils Absolute 0.0 0.0 - 0.1 K/uL    Immature Granulocytes 0 %   Abs Immature Granulocytes 0.03 0.00 - 0.07 K/uL    Comment: Performed at [facility], [place] [address], [place], [place] [address]  Protime-INR     Status: None   Collection Time: 12/02/21  8:43 PM  Result Value Ref Range   Prothrombin Time 12.8 11.4 - 15.2 seconds   INR 1.0 0.8 - 1.2    Comment: (NOTE) INR goal varies based on device and disease states. Performed at [facility], [place] [address] [place], [place] [address]   Type and screen [place]     Status: None   Collection Time: 12/02/21  8:43 PM  Result Value Ref Range   ABO/RH(D) O NEG    Antibody Screen NEG    Sample Expiration      12/05/2021,2359 Performed at [facility], [place] [address] [place], [place] [address]   Resp Panel by RT-PCR (Flu A&B, Covid) Nasopharyngeal Swab     Status: None   Collection Time: 12/02/21  9:36 PM   Specimen: Nasopharyngeal Swab; Nasopharyngeal(NP) swabs in vial transport medium  Result Value Ref Range   SARS Coronavirus 2 by RT PCR NEGATIVE NEGATIVE    Comment: (NOTE) SARS-CoV-2 target nucleic acids are NOT DETECTED.  The SARS-CoV-2 RNA is generally detectable in upper respiratory specimens during the acute phase of infection. The lowest concentration of SARS-CoV-2 viral copies this assay can detect is 138 copies/mL. A negative result does not preclude SARS-Cov-2 infection and should not be used as the sole basis for treatment or other patient management decisions. A negative result may occur with  improper specimen collection/handling, submission of specimen other than nasopharyngeal swab, presence of viral mutation(s) within the areas targeted by this assay, and inadequate number of viral copies(<138 copies/mL). A negative result must be combined with clinical observations, patient history, and epidemiological information. The expected result is Negative.  Fact Sheet for  Patients:  EntrepreneurPulse.com.au  Fact Sheet for Healthcare Providers:  IncredibleEmployment.be  This test is no t yet approved or  cleared by the [place] and  has been authorized for detection and/or diagnosis of SARS-CoV-2 by FDA under an Emergency Use Authorization (EUA). This EUA will remain  in effect (meaning this test can be used) for the duration of the COVID-19 declaration under Section 564(b)(1) of the Act, 21 U.S.C.section 360bbb-3(b)(1), unless the authorization is terminated  or revoked sooner.       Influenza A by PCR NEGATIVE NEGATIVE   Influenza B by PCR NEGATIVE NEGATIVE    Comment: (NOTE) The Xpert Xpress SARS-CoV-2/FLU/RSV plus assay is intended as an aid in the diagnosis of influenza from Nasopharyngeal swab specimens and should not be used as a sole basis for treatment. Nasal washings and aspirates are unacceptable for Xpert Xpress SARS-CoV-2/FLU/RSV testing.  Fact Sheet for Patients: EntrepreneurPulse.com.au  Fact Sheet for Healthcare Providers: IncredibleEmployment.be  This test is not yet approved or cleared by the [place] FDA and has been authorized for detection and/or diagnosis of SARS-CoV-2 by FDA under an Emergency Use Authorization (EUA). This EUA will remain in effect (meaning this test can be used) for the duration of the COVID-19 declaration under Section 564(b)(1) of the Act, 21 U.S.C. section 360bbb-3(b)(1), unless the authorization is terminated or revoked.  Performed at [facility], [place] [address], [place], [place] [address]   ABO/Rh     Status: None   Collection Time: 12/03/21  1:01 AM  Result Value Ref Range   ABO/RH(D)      Jenetta Downer NEG Performed at [place] [address], [place], [place] [address]   HIV Antibody (routine testing w rflx)     Status: None   Collection Time: 12/03/21  1:01 AM  Result  Value Ref Range   HIV Screen 4th Generation wRfx Non Reactive Non Reactive    Comment: Performed at [facility], [place] [address], [place], [place] [address]  Magnesium     Status: None   Collection Time: 12/03/21  1:01 AM  Result Value Ref Range   Magnesium 2.4 1.7 - 2.4 mg/dL    Comment: Performed at [facility], [place] [address] [place], [place] [address]  Comprehensive metabolic panel     Status: Abnormal   Collection Time: 12/03/21  1:01 AM  Result Value Ref Range   Sodium 136 135 - 145 mmol/L   Potassium 3.6 3.5 - 5.1 mmol/L   Chloride 106 98 - 111 mmol/L   CO2 25 22 - 32 mmol/L   Glucose, Bld 104 (H) 70 - 99 mg/dL    Comment: Glucose reference range applies only to samples taken after fasting for at least 8 hours.   BUN 11 8 - 23 mg/dL   Creatinine, Ser 0.51 0.44 - 1.00 mg/dL   Calcium 8.7 (L) 8.9 - 10.3 mg/dL   Total Protein 6.9 6.5 - 8.1 g/dL   Albumin 4.3 3.5 - 5.0 g/dL   AST 23 15 - 41 U/L   ALT 20 0 - 44 U/L   Alkaline Phosphatase 63 38 - 126 U/L   Total Bilirubin 0.7 0.3 - 1.2 mg/dL   GFR, Estimated >60 >60 mL/min    Comment: (NOTE) Calculated using the CKD-EPI Creatinine Equation (2021)    Anion gap 5 5 - 15    Comment: Performed at [facility], [place] [address] [place], [place] [address]  CBC with Differential/Platelet     Status: None   Collection Time: 12/03/21  1:01 AM  Result Value Ref  Range   WBC 8.0 4.0 - 10.5 K/uL   RBC 4.65 3.87 - 5.11 MIL/uL   Hemoglobin 13.1 12.0 - 15.0 g/dL   HCT 39.1 36.0 - 46.0 %   MCV 84.1 80.0 - 100.0 fL   MCH 28.2 26.0 - 34.0 pg   MCHC 33.5 30.0 - 36.0 g/dL   RDW 13.9 11.5 - 15.5 %   Platelets 214 150 - 400 K/uL   nRBC 0.0 0.0 - 0.2 %   Neutrophils Relative % 69 %   Neutro Abs 5.5 1.7 - 7.7 K/uL   Lymphocytes Relative 21 %   Lymphs Abs 1.7 0.7 - 4.0 K/uL   Monocytes Relative 8 %   Monocytes Absolute 0.7 0.1 - 1.0 K/uL   Eosinophils Relative 1 %   Eosinophils Absolute 0.0 0.0  - 0.5 K/uL   Basophils Relative 1 %   Basophils Absolute 0.1 0.0 - 0.1 K/uL   Immature Granulocytes 0 %   Abs Immature Granulocytes 0.02 0.00 - 0.07 K/uL    Comment: Performed at [facility], [place] [address], [place], [place] [address]  VITAMIN D 25 Hydroxy (Vit-D Deficiency, Fractures)     Status: None   Collection Time: 12/03/21  6:34 AM  Result Value Ref Range   Vit D, 25-Hydroxy 55.60 30 - 100 ng/mL    Comment: (NOTE) Vitamin D deficiency has been defined by the [place] practice guideline as a level of serum 25-OH  vitamin D less than 20 ng/mL (1,2). The Endocrine Society went on to  further define vitamin D insufficiency as a level between 21 and 29  ng/mL (2).  1. IOM (Institute of Medicine). 2010. Dietary reference intakes for  calcium and D. [place]: The [employer]. 2. Holick MF, Binkley Camas, Bischoff-Ferrari HA, et al. Evaluation,  treatment, and prevention of vitamin D deficiency: an Endocrine  Society clinical practice guideline, JCEM. 2011 Jul; 96(7): 1911-30.  Performed at [facility], [place] [address], [place], [place] [address]     DG Chest 1 View  Result Date: 12/02/2021 CLINICAL DATA:  Hip fracture EXAM: CHEST  1 VIEW COMPARISON:  08/24/2011 FINDINGS: The heart size and mediastinal contours are within normal limits. Both lungs are clear. The visualized skeletal structures are unremarkable. IMPRESSION: No active disease. Electronically Signed   By: Donavan Foil M.D.   On: 12/02/2021 21:12   DG Knee Left Port  Result Date: 12/03/2021 CLINICAL DATA:  Hip fracture EXAM: PORTABLE LEFT KNEE - 1-2 VIEW COMPARISON:  None. FINDINGS: There is no acute fracture or dislocation. Knee alignment is normal. There is mild tricompartmental joint space narrowing with associated osteophytosis. A small ossific fragment within the knee joint may reflect a loose body. The soft tissues are unremarkable.  There is no effusion. IMPRESSION: No acute fracture or dislocation. Electronically Signed   By: Valetta Mole M.D.   On: 12/03/2021 12:04   DG Hip Unilat W or Wo Pelvis 2-3 Views Left  Result Date: 12/02/2021 CLINICAL DATA:  Left hip pain. EXAM: DG HIP (WITH OR WITHOUT PELVIS) 2-3V LEFT COMPARISON:  None. FINDINGS: Bilateral femoral head replacements. There is apparent fracture of the superior aspect of the left femoral neck. There is proximal migration of the femoral shaft in relation to the arthroplasty. No dislocation. The bones are osteopenic. Degenerative changes of the lower lumbar spine. The soft tissues are unremarkable. IMPRESSION: Mildly displaced left femoral neck fracture. No dislocation. Electronically Signed  By: Anner Crete M.D.   On: 12/02/2021 20:39    Review of Systems  HENT:  Negative for ear discharge, ear pain, hearing loss and tinnitus.   Eyes:  Negative for photophobia and pain.  Respiratory:  Negative for cough and shortness of breath.   Cardiovascular:  Negative for chest pain.  Gastrointestinal:  Negative for abdominal pain, nausea and vomiting.  Genitourinary:  Negative for dysuria, flank pain, frequency and urgency.  Musculoskeletal:  Positive for arthralgias (Left hip). Negative for back pain, myalgias and neck pain.  Neurological:  Negative for dizziness and headaches.  Hematological:  Does not bruise/bleed easily.  Psychiatric/Behavioral:  The patient is not nervous/anxious.   Blood pressure 126/69, pulse 76, temperature 99 F (37.2 C), resp. rate 17, height 5\' 6"  (1.676 m), weight 65.1 kg, last menstrual period 12/05/2012, SpO2 99 %. Physical Exam Constitutional:      General: She is not in acute distress.    Appearance: She is well-developed. She is not diaphoretic.  HENT:     Head: Normocephalic and atraumatic.  Eyes:     General: No scleral icterus.       Right eye: No discharge.        Left eye: No discharge.     Conjunctiva/sclera: Conjunctivae  normal.  Cardiovascular:     Rate and Rhythm: Normal rate and regular rhythm.  Pulmonary:     Effort: Pulmonary effort is normal. No respiratory distress.  Musculoskeletal:     Cervical back: Normal range of motion.     Comments: LLE No traumatic wounds, ecchymosis, or rash  Nontender  No knee or ankle effusion  Knee stable to varus/ valgus and anterior/posterior stress  Sens DPN, SPN, TN intact  Motor EHL, ext, flex, evers 5/5  DP 2+, PT 1+, No significant edema  Skin:    General: Skin is warm and dry.  Neurological:     Mental Status: She is alert.  Psychiatric:        Mood and Affect: Mood normal.        Behavior: Behavior normal.    Assessment/Plan: Left hip fx -- Plan THA revision Wednesday with Dr. Lyla Glassing. At pt's request I called [facility] and spoke to Jaymes Graff who was unable to complete surgery in a more timely fashion as they have no open orthopedic beds.    Lisette Abu, PA-C Orthopedic Surgery [phone] 12/03/2021, 12:42 PM     Bertram Savin, MD 12/08/21

## 2021-12-04 ENCOUNTER — Inpatient Hospital Stay (HOSPITAL_COMMUNITY): Payer: 59

## 2021-12-04 DIAGNOSIS — R002 Palpitations: Secondary | ICD-10-CM

## 2021-12-04 LAB — BASIC METABOLIC PANEL
Anion gap: 8 (ref 5–15)
BUN: 14 mg/dL (ref 8–23)
CO2: 27 mmol/L (ref 22–32)
Calcium: 9.2 mg/dL (ref 8.9–10.3)
Chloride: 94 mmol/L — ABNORMAL LOW (ref 98–111)
Creatinine, Ser: 0.42 mg/dL — ABNORMAL LOW (ref 0.44–1.00)
GFR, Estimated: >60 mL/min (ref >60)
Glucose, Bld: 102 mg/dL — ABNORMAL HIGH (ref 70–99)
Potassium: 3.8 mmol/L (ref 3.5–5.1)
Sodium: 129 mmol/L — ABNORMAL LOW (ref 135–145)

## 2021-12-04 LAB — URINALYSIS, ROUTINE W REFLEX MICROSCOPIC
Bacteria, UA: NONE SEEN
Bilirubin Urine: NEGATIVE
Glucose, UA: NEGATIVE mg/dL
Hgb urine dipstick: NEGATIVE
Ketones, ur: 5 mg/dL — AB
Nitrite: NEGATIVE
Protein, ur: NEGATIVE mg/dL
Specific Gravity, Urine: 1.003 — ABNORMAL LOW (ref 1.005–1.030)
pH: 6 (ref 5.0–8.0)

## 2021-12-04 LAB — CREATININE, URINE, RANDOM: Creatinine, Urine: 18.47 mg/dL

## 2021-12-04 LAB — ECHOCARDIOGRAM COMPLETE
Area-P 1/2: 2.02 cm2
Height: 66 in
S' Lateral: 2.9 cm
Weight: 2296.31 oz

## 2021-12-04 LAB — SODIUM, URINE, RANDOM: Sodium, Ur: <10 mmol/L

## 2021-12-04 LAB — MAGNESIUM: Magnesium: 2 mg/dL (ref 1.7–2.4)

## 2021-12-04 MED ORDER — SODIUM CHLORIDE 0.9 % IV SOLN: INTRAVENOUS | Status: DC

## 2021-12-04 MED ORDER — SALINE SPRAY 0.65 % NA SOLN
1.0000 | NASAL | Status: DC | PRN
  Administered 2021-12-04: 1 via NASAL
  Filled 2021-12-04 (×2): qty 44

## 2021-12-04 NOTE — Plan of Care (Signed)

## 2021-12-04 NOTE — Progress Notes (Addendum)
PROGRESS NOTE    Isabel [name]  KZS:[id number] DOB: 05/29/1956 DOA: 12/02/2021 PCP: Caren Macadam, MD    Chief Complaint  Patient presents with   Hip Pain    Brief Narrative: Patient is a 65 year old female history of osteoarthritis with bilateral hip resurfacing and possible metal reaction, history of breast cancer presenting to the ED when walking felt a pop in the left hip with immediate pain and inability to bear weight.  Patient denies any fall.  No syncopal episode.  No chest pain.  Patient seen in the ED plain films and consistent with a left femoral neck fracture.  Patient admitted.  Orthopedics consulted for further evaluation.   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 -Plain films consistent with left femoral neck fracture. 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  Commuted left femoral neck fracture with a fracture cleft extending along the medial aspect into the proximal diaphysis. -Preop EKG with normal sinus rhythm. -Patient with no chest pain.  No history of coronary artery disease. -Patient did state had been having some intermittent palpitations and was scheduled for outpatient 2D echo. -2D echo with a EF of 60-65%, NW[facility].  -Patient seen in consultation by orthopedics who planning THA  revision on Wednesday per Dr. Lyla Glassing. -It is noted by orthopedics note that they called [facility] at patient's request and spoke to Daisey Must who was unable to complete surgery in a more timely fashion as no orthopedics beds available. -Per orthopedics.  2.  Fall ruled out.  3.  Hyponatremia -Check a UA with cultures and sensitivities. -Check a urine sodium, urine creatinine -IV fluids. -Repeat labs in the AM.   DVT prophylaxis: SCDs preoperative Code Status: Full Family Communication: Updated patient.  No family at bedside. Disposition:   Status is: Inpatient  Remains inpatient appropriate because: Severity of illness.       Consultants:  Orthopedics: Hilbert Odor, PA 12/03/2021  Procedures:  CT left hip 12/03/2021 Plain films of the left knee 12/03/2021 Chest x-ray 12/02/2021 Plain films of the left hip and pelvis 12/02/2021. 2D echo 12/04/2021   Antimicrobials:  None   Subjective: Laying in bed.  No chest pain.  No shortness of breath.  No abdominal pain.  Still with complaints of left hip pain with movement.    Objective: Vitals:   12/03/21 1741 12/03/21 2110 12/04/21 0546 12/04/21 1315  BP: (!) 147/78 135/72 (!) 144/79 116/88  Pulse: 82 78 77 94  Resp: 16 16 16 18   Temp: 98.6 F (37 C) 99.4 F (37.4 C) 98.4 F (36.9 C) 98.7 F (37.1 C)  TempSrc:  Oral Oral Oral  SpO2: 100% 98% 98% 95%  Weight:      Height:        Intake/Output Summary (Last 24 hours) at 12/04/2021 1551 Last data filed at 12/04/2021 1258 Gross per 24 hour  Intake 1173.4 ml  Output 2100 ml  Net -926.6 ml    Filed Weights   12/02/21 1958 12/03/21 0500  Weight: 65.8 kg 65.1 kg    Examination:  General exam: NAD. Respiratory system: Lungs clear to auscultation bilaterally anterior lung fields.  No wheezes, no crackles, no rhonchi.  Normal respiratory effort.   Cardiovascular system: Regular rate rhythm no murmurs rubs or gallops.  No JVD.  No lower extremity edema.   Gastrointestinal system: Abdomen is soft, nontender, nondistended, positive bowel sounds.  No rebound.  No guarding.  Central nervous system: Alert and oriented. No focal neurological deficits. Extremities: Left lower extremity shortened and externally rotated.  Nontender to palpation on the left hip area.  Skin: No rashes, lesions or ulcers Psychiatry: Judgement and insight appear normal. Mood & affect appropriate.     Data Reviewed: I have personally reviewed following labs and imaging studies  CBC: Recent Labs  Lab 12/02/21 2043 12/03/21 0101  WBC 7.5 8.0  NEUTROABS 4.9 5.5  HGB 13.0 13.1  HCT 39.9 39.1  MCV 85.3 84.1  PLT 231 214     Basic Metabolic Panel: Recent Labs  Lab 12/02/21 2043 12/03/21 0101 12/04/21 0343  NA 138 136 129*  K 3.6 3.6 3.8  CL 105 106 94*  CO2 21* 25 27  GLUCOSE 101* 104* 102*  BUN 14 11 14   CREATININE 0.48 0.51 0.42*  CALCIUM 9.4 8.7* 9.2  MG  --  2.4 2.0     GFR: Estimated Creatinine Clearance: 67.4 mL/min (A) (by C-G formula based on SCr of 0.42 mg/dL (L)).  Liver Function Tests: Recent Labs  Lab 12/03/21 0101  AST 23  ALT 20  ALKPHOS 63  BILITOT 0.7  PROT 6.9  ALBUMIN 4.3     CBG: No results for input(s): GLUCAP in the last 168 hours.   Recent Results (from the past 240 hour(s))  Resp Panel by RT-PCR (Flu A&B, Covid) Nasopharyngeal Swab     Status: None   Collection Time: 12/02/21  9:36 PM   Specimen: Nasopharyngeal Swab; Nasopharyngeal(NP) swabs in vial transport medium  Result Value Ref Range Status   SARS Coronavirus 2 by RT PCR NEGATIVE NEGATIVE Final    Comment: (NOTE) SARS-CoV-2 target nucleic acids are NOT DETECTED.  The SARS-CoV-2 RNA is generally detectable in upper respiratory specimens during the acute phase of infection. The lowest concentration of SARS-CoV-2 viral copies this assay can detect is 138 copies/mL. A negative result does not preclude SARS-Cov-2 infection and should not be used as the  sole basis for treatment or other patient management decisions. A negative result may occur with  improper specimen collection/handling, submission of specimen other than nasopharyngeal swab, presence of viral mutation(s) within the areas targeted by this assay, and inadequate number of viral copies(<138 copies/mL). A negative result must be combined with clinical observations, patient history, and epidemiological information. The expected result is Negative.  Fact Sheet for Patients:  EntrepreneurPulse.com.au  Fact Sheet for Healthcare Providers:  IncredibleEmployment.be  This test is no t yet approved or cleared by the [place] FDA and  has been authorized for detection and/or diagnosis of SARS-CoV-2 by FDA under an Emergency Use Authorization (EUA). This EUA will remain  in effect (meaning this test can be used) for the duration of the COVID-19 declaration under Section 564(b)(1) of the Act, 21 U.S.C.section 360bbb-3(b)(1), unless the authorization is terminated  or revoked sooner.       Influenza A by PCR NEGATIVE NEGATIVE Final  Influenza B by PCR NEGATIVE NEGATIVE Final    Comment: (NOTE) The Xpert Xpress SARS-CoV-2/FLU/RSV plus assay is intended as an aid in the diagnosis of influenza from Nasopharyngeal swab specimens and should not be used as a sole basis for treatment. Nasal washings and aspirates are unacceptable for Xpert Xpress SARS-CoV-2/FLU/RSV testing.  Fact Sheet for Patients: EntrepreneurPulse.com.au  Fact Sheet for Healthcare Providers: IncredibleEmployment.be  This test is not yet approved or cleared by the [place] FDA and has been authorized for detection and/or diagnosis of SARS-CoV-2 by FDA under an Emergency Use Authorization (EUA). This EUA will remain in effect (meaning this test can be used) for the duration of the COVID-19 declaration under Section 564(b)(1) of the  Act, 21 U.S.C. section 360bbb-3(b)(1), unless the authorization is terminated or revoked.  Performed at [facility], [place] [address], [place], [place] [address]           Radiology Studies: DG Chest 1 View  Result Date: 12/02/2021 CLINICAL DATA:  Hip fracture EXAM: CHEST  1 VIEW COMPARISON:  08/24/2011 FINDINGS: The heart size and mediastinal contours are within normal limits. Both lungs are clear. The visualized skeletal structures are unremarkable. IMPRESSION: No active disease. Electronically Signed   By: Donavan Foil M.D.   On: 12/02/2021 21:12   CT HIP LEFT WO CONTRAST  Result Date: 12/03/2021 CLINICAL DATA:  Surgical planning.  Surgical planning. EXAM: CT OF THE LEFT HIP WITHOUT CONTRAST TECHNIQUE: Multidetector CT imaging of the left hip was performed according to the standard protocol. Multiplanar CT image reconstructions were also generated. COMPARISON:  None. FINDINGS: Bones/Joint/Cartilage Left total hip arthroplasty. 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 Smaller fluid collection extending from the inferior joint space measuring 3.4 x 1.6 cm. Comminuted left femoral neck fracture with a fracture cleft extending along the medial aspect into the proximal diaphysis. No other fracture or dislocation. Mild osteoarthritis of the left SI joint. No joint effusion. Ligaments Ligaments are suboptimally evaluated by CT. Muscles and Tendons No muscle atrophy. Soft tissue No fluid collection or hematoma.  No soft tissue mass. IMPRESSION: 1. Left total hip arthroplasty. 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 Smaller fluid collection extending from the inferior joint  space measuring 3.4 x 1.6 cm also concerning for particle disease. 2. Comminuted left femoral neck fracture with a fracture cleft extending along the medial aspect into the proximal diaphysis. Electronically Signed   By: Kathreen Devoid M.D.   On: 12/03/2021 14:56   DG Knee Left Port  Result Date: 12/03/2021 CLINICAL DATA:  Hip fracture EXAM: PORTABLE LEFT KNEE - 1-2 VIEW COMPARISON:  None. FINDINGS: There is no acute fracture or dislocation. Knee alignment is normal. There is mild tricompartmental joint space narrowing with associated osteophytosis. A small ossific fragment within the knee joint may reflect a loose body. The soft tissues are unremarkable. There is no effusion. IMPRESSION: No acute fracture or dislocation. Electronically Signed   By: Valetta Mole M.D.   On: 12/03/2021 12:04   ECHOCARDIOGRAM COMPLETE  Result Date: 12/04/2021    ECHOCARDIOGRAM REPORT   Patient Name:   Isabel [name] Date of Exam: 12/04/2021 Medical Rec #:  [id number]  Height:       66.0 in Accession #:    [id number]           Weight:       143.5 lb Date of Birth:  [birth year]             BSA:          1.737 m Patient Age:    76 years             BP:           144/79 mmHg Patient Gender: F                    HR:           90 bpm. Exam Location:  Inpatient Procedure: 2D Echo, Cardiac Doppler and Color Doppler Indications:    Palpitations [785.1.ICD-9-CM]  History:        Patient has no prior history of Echocardiogram examinations.                 Left hip fracture. Past history of breast cancer.  Sonographer:    Darlina Sicilian RDCS Referring Phys: [phone] Tannor Pyon V Katrinka Herbison  Sonographer Comments: Limited motion due to left hip fracture. IMPRESSIONS  1. Left ventricular ejection fraction, by estimation, is 60 to 65%. The left ventricle has normal function. The left ventricle has no regional wall motion abnormalities. Left ventricular diastolic parameters were normal.  2. Right ventricular systolic function is normal. The  right ventricular size is normal.  3. The mitral valve is normal in structure. No evidence of mitral valve regurgitation. No evidence of mitral stenosis.  4. The aortic valve is normal in structure. Aortic valve regurgitation is not visualized. No aortic stenosis is present.  5. The inferior vena cava is normal in size with greater than 50% respiratory variability, suggesting right atrial pressure of 3 mmHg. FINDINGS  Left Ventricle: Left ventricular ejection fraction, by estimation, is 60 to 65%. The left ventricle has normal function. The left ventricle has no regional wall motion abnormalities. The left ventricular internal cavity size was normal in size. There is  no left ventricular hypertrophy. Left ventricular diastolic parameters were normal. Right Ventricle: The right ventricular size is normal. No increase in right ventricular wall thickness. Right ventricular systolic function is normal. Left Atrium: Left atrial size was normal in size. Right Atrium: Right atrial size was normal in size. Pericardium: There is no evidence of pericardial effusion. Mitral Valve: The mitral valve is normal in structure. No evidence of mitral valve regurgitation. No evidence of mitral valve stenosis. Tricuspid Valve: The tricuspid valve is normal in structure. Tricuspid valve regurgitation is not demonstrated. No evidence of tricuspid stenosis. Aortic Valve: The aortic valve is normal in structure. Aortic valve regurgitation is not visualized. No aortic stenosis is present. Pulmonic Valve: The pulmonic valve was normal in structure. Pulmonic valve regurgitation is not visualized. No evidence of pulmonic stenosis. Aorta: The aortic root is normal in size and structure. Venous: The inferior vena cava is normal in size with greater than 50% respiratory variability, suggesting right atrial pressure of 3 mmHg. IAS/Shunts: No atrial level shunt detected by color flow Doppler.  LEFT VENTRICLE PLAX 2D LVIDd:         4.00 cm    Diastology LVIDs:         2.90 cm   LV e' medial:    9.25 cm/s LV PW:         0.90 cm  LV E/e' medial:  7.2 LV IVS:        1.00 cm   LV e' lateral:   7.83 cm/s LVOT diam:     2.00 cm   LV E/e' lateral: 8.5 LV SV:         87 LV SV Index:   50 LVOT Area:     3.14 cm  RIGHT VENTRICLE RV S prime:     18.00 cm/s TAPSE (M-mode): 1.5 cm LEFT ATRIUM           Index LA diam:      3.00 cm 1.73 cm/m LA Vol (A2C): 23.6 ml 13.59 ml/m LA Vol (A4C): 22.9 ml 13.19 ml/m  AORTIC VALVE LVOT Vmax:   148.00 cm/s LVOT Vmean:  92.600 cm/s LVOT VTI:    0.277 m  AORTA Ao Root diam: 3.10 cm MITRAL VALVE MV Area (PHT): 2.02 cm    SHUNTS MV Decel Time: 375 msec    Systemic VTI:  0.28 m MV E velocity: 66.40 cm/s  Systemic Diam: 2.00 cm MV A velocity: 66.00 cm/s MV E/A ratio:  1.01 Jenkins Rouge MD Electronically signed by Jenkins Rouge MD Signature Date/Time: 12/04/2021/11:21:14 AM    Final    DG Hip Unilat W or Wo Pelvis 2-3 Views Left  Result Date: 12/02/2021 CLINICAL DATA:  Left hip pain. EXAM: DG HIP (WITH OR WITHOUT PELVIS) 2-3V LEFT COMPARISON:  None. FINDINGS: Bilateral femoral head replacements. There is apparent fracture of the superior aspect of the left femoral neck. There is proximal migration of the femoral shaft in relation to the arthroplasty. No dislocation. The bones are osteopenic. Degenerative changes of the lower lumbar spine. The soft tissues are unremarkable. IMPRESSION: Mildly displaced left femoral neck fracture. No dislocation. Electronically Signed   By: Anner Crete M.D.   On: 12/02/2021 20:39        Scheduled Meds: Continuous Infusions:  sodium chloride 125 mL/hr at 12/04/21 0929     LOS: 2 days    Time spent: 35 minutes    Irine Seal, MD [facility]   To contact the attending provider between 7A-7P or the covering provider during after hours 7P-7A, please log into the web site [email or web address] and access using universal  password for that web site. If you do not  have the password, please call the hospital operator.  12/04/2021, 3:51 PM

## 2021-12-04 NOTE — Progress Notes (Signed)
°  Echocardiogram 2D Echocardiogram has been performed.  Darlina Sicilian M 12/04/2021, 10:46 AM

## 2021-12-05 ENCOUNTER — Inpatient Hospital Stay (HOSPITAL_COMMUNITY): Payer: 59

## 2021-12-05 DIAGNOSIS — N390 Urinary tract infection, site not specified: Secondary | ICD-10-CM | POA: Diagnosis present

## 2021-12-05 DIAGNOSIS — R509 Fever, unspecified: Secondary | ICD-10-CM | POA: Diagnosis present

## 2021-12-05 LAB — BASIC METABOLIC PANEL
Anion gap: 6 (ref 5–15)
BUN: 9 mg/dL (ref 8–23)
CO2: 27 mmol/L (ref 22–32)
Calcium: 8.9 mg/dL (ref 8.9–10.3)
Chloride: 98 mmol/L (ref 98–111)
Creatinine, Ser: 0.45 mg/dL (ref 0.44–1.00)
GFR, Estimated: >60 mL/min (ref >60)
Glucose, Bld: 101 mg/dL — ABNORMAL HIGH (ref 70–99)
Potassium: 3.4 mmol/L — ABNORMAL LOW (ref 3.5–5.1)
Sodium: 131 mmol/L — ABNORMAL LOW (ref 135–145)

## 2021-12-05 MED ORDER — SODIUM CHLORIDE 0.9 % IV SOLN: INTRAVENOUS | Status: DC

## 2021-12-05 MED ORDER — SODIUM CHLORIDE 0.9 % IV SOLN
2.0000 g | INTRAVENOUS | Status: DC
  Administered 2021-12-05 – 2021-12-07 (×3): 2 g via INTRAVENOUS
  Filled 2021-12-05 (×3): qty 20

## 2021-12-05 MED ORDER — ACETAMINOPHEN 500 MG PO TABS: 500.0000 mg | ORAL_TABLET | Freq: Three times a day (TID) | ORAL | Status: DC

## 2021-12-05 MED ORDER — POTASSIUM CHLORIDE CRYS ER 20 MEQ PO TBCR
40.0000 meq | EXTENDED_RELEASE_TABLET | Freq: Once | ORAL | Status: AC
  Administered 2021-12-05: 40 meq via ORAL
  Filled 2021-12-05: qty 2

## 2021-12-05 NOTE — Progress Notes (Addendum)
PROGRESS NOTE    Isabel [name]  BJS:[id number] DOB: 29-Mar-1956 DOA: 12/02/2021 PCP: Caren Macadam, MD    Chief Complaint  Patient presents with   Hip Pain    Brief Narrative: Patient is a 66 year old female history of osteoarthritis with bilateral hip resurfacing and possible metal reaction, history of breast cancer presenting to the ED when walking felt a pop in the left hip with immediate pain and inability to bear weight.  Patient denies any fall.  No syncopal episode.  No chest pain.  Patient seen in the ED plain films and consistent with a left femoral neck fracture.  Patient admitted.  Orthopedics consulted for further evaluation.   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 -Plain films consistent with left femoral neck fracture. 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  Commuted left femoral neck fracture with a fracture cleft extending along the medial aspect into the proximal diaphysis. -Preop EKG with normal sinus rhythm. -Patient with no chest pain.  No history of coronary artery disease. -Patient did state had been having some intermittent palpitations and was scheduled for outpatient 2D echo. -2D echo with a EF of 60-65%, NWMA.  -Patient seen in consultation by  orthopedics who planning THA revision on Wednesday per Dr. Lyla Glassing. -It is noted by orthopedics note that they called [facility] at patient's request and spoke to Daisey Must who was unable to complete surgery in a more timely fashion as no orthopedics beds available. -Per orthopedics.  2.  Fall ruled out.  3.  Hyponatremia -Likely secondary to hypovolemic hyponatremia as urine sodium noted to be < 10.  Urinalysis with concerns for possible UTI.   -Sodium levels improving with hydration.   -Continue IV fluids.   -Place on IV antibiotics for presumed UTI.   -Repeat labs in AM.   4.  Fever/probable UTI -Patient with some complaints of suprapubic abdominal discomfort. -Urinalysis done with large leukocytes, nitrite negative, 6-10 WBCs. -Check urine cultures. -Patient noted to have a fever of 101.4 this afternoon, check blood cultures x2, chest x-ray. -Incentive spirometry. -IV Rocephin.  5.  Hypokalemia -Replete.   DVT prophylaxis: SCDs preoperative Code Status: Full Family Communication: Updated patient.  No family at bedside. Disposition:   Status is: Inpatient  Remains inpatient appropriate because: Severity of illness.       Consultants:  Orthopedics: Hilbert Odor, PA 12/03/2021  Procedures:  CT left hip 12/03/2021 Plain films of the left knee 12/03/2021 Chest x-ray 12/02/2021 Plain films of the left hip and pelvis 12/02/2021. 2D echo 12/04/2021   Antimicrobials:  IV Rocephin 12/05/2021   Subjective: Laying in bed.  Patient states as long as she does not move does not have significant left hip pain.  States pain regimen helping.  Complain of some lower abdominal discomfort.  Stated she felt  she may not have been emptying out her bladder fully however feels that was accomplished this morning.  Patient with some complaints of chills.  Patient noted to have a temperature of 101.4 this afternoon.    Objective: Vitals:   12/04/21 1930 12/05/21 0352 12/05/21 1323  12/05/21 1618  BP: 137/77 134/67 137/73   Pulse: 89 86 95   Resp: 15 18 18    Temp:  98.2 F (36.8 C) (!) 101.4 F (38.6 C) (!) 100.8 F (38.2 C)  TempSrc: Oral Oral Oral Oral  SpO2: 98% 98% 99%   Weight:      Height:        Intake/Output Summary (Last 24 hours) at 12/05/2021 1619 Last data filed at 12/05/2021 1126 Gross per 24 hour  Intake 1242.1 ml  Output 1900 ml  Net -657.9 ml   Filed Weights   12/02/21 1958 12/03/21 0500  Weight: 65.8 kg 65.1 kg    Examination:  General exam: NAD. Respiratory system: CTA B anterior lung fields.  No wheezes, no crackles, no rhonchi.  Normal respiratory effort.   Cardiovascular system: RRR no murmurs rubs or gallops.  No JVD.  No lower extremity edema.  Gastrointestinal system: Abdomen is soft, nondistended, positive bowel sounds, some tenderness to palpation in the suprapubic region.  No rebound.  No guarding.  Central nervous system: Alert and oriented. No focal neurological deficits. Extremities: Left lower extremity shortened and externally rotated.  Nontender to palpation on the left hip area.  Skin: No rashes, lesions or ulcers Psychiatry: Judgement and insight appear normal. Mood & affect appropriate.     Data Reviewed: I have personally reviewed following labs and imaging studies  CBC: Recent Labs  Lab 12/02/21 2043 12/03/21 0101  WBC 7.5 8.0  NEUTROABS 4.9 5.5  HGB 13.0 13.1  HCT 39.9 39.1  MCV 85.3 84.1  PLT 231 357    Basic Metabolic Panel: Recent Labs  Lab 12/02/21 2043 12/03/21 0101 12/04/21 0343 12/05/21 0315  NA 138 136 129* 131*  K 3.6 3.6 3.8 3.4*  CL 105 106 94* 98  CO2 21* 25 27 27   GLUCOSE 101* 104* 102* 101*  BUN 14 11 14 9   CREATININE 0.48 0.51 0.42* 0.45  CALCIUM 9.4 8.7* 9.2 8.9  MG  --  2.4 2.0  --     GFR: Estimated Creatinine Clearance: 67.4 mL/min (by C-G formula based on SCr of 0.45 mg/dL).  Liver Function Tests: Recent Labs  Lab 12/03/21 0101  AST 23  ALT 20  ALKPHOS 63   BILITOT 0.7  PROT 6.9  ALBUMIN 4.3    CBG: No results for input(s): GLUCAP in the last 168 hours.   Recent Results (from the past 240 hour(s))  Resp Panel by RT-PCR (Flu A&B, Covid) Nasopharyngeal Swab     Status: None   Collection Time: 12/02/21  9:36 PM   Specimen: Nasopharyngeal Swab; Nasopharyngeal(NP) swabs in vial transport medium  Result Value Ref Range Status   SARS Coronavirus 2 by RT PCR NEGATIVE NEGATIVE Final    Comment: (NOTE) SARS-CoV-2 target nucleic acids are NOT DETECTED.  The SARS-CoV-2 RNA is generally detectable in upper respiratory specimens during the acute phase of infection. The lowest concentration of SARS-CoV-2 viral copies this assay can detect is 138 copies/mL. A negative result does not preclude SARS-Cov-2 infection and should not be used as the sole basis for treatment or other patient management decisions. A negative result may occur with  improper specimen collection/handling, submission of specimen other  than nasopharyngeal swab, presence of viral mutation(s) within the areas targeted by this assay, and inadequate number of viral copies(<138 copies/mL). A negative result must be combined with clinical observations, patient history, and epidemiological information. The expected result is Negative.  Fact Sheet for Patients:  EntrepreneurPulse.com.au  Fact Sheet for Healthcare Providers:  IncredibleEmployment.be  This test is no t yet approved or cleared by the [place] FDA and  has been authorized for detection and/or diagnosis of SARS-CoV-2 by FDA under an Emergency Use Authorization (EUA). This EUA will remain  in effect (meaning this test can be used) for the duration of the COVID-19 declaration under Section 564(b)(1) of the Act, 21 U.S.C.section 360bbb-3(b)(1), unless the authorization is terminated  or revoked sooner.       Influenza A by PCR NEGATIVE NEGATIVE Final   Influenza B by PCR  NEGATIVE NEGATIVE Final    Comment: (NOTE) The Xpert Xpress SARS-CoV-2/FLU/RSV plus assay is intended as an aid in the diagnosis of influenza from Nasopharyngeal swab specimens and should not be used as a sole basis for treatment. Nasal washings and aspirates are unacceptable for Xpert Xpress SARS-CoV-2/FLU/RSV testing.  Fact Sheet for Patients: EntrepreneurPulse.com.au  Fact Sheet for Healthcare Providers: IncredibleEmployment.be  This test is not yet approved or cleared by the [place] FDA and has been authorized for detection and/or diagnosis of SARS-CoV-2 by FDA under an Emergency Use Authorization (EUA). This EUA will remain in effect (meaning this test can be used) for the duration of the COVID-19 declaration under Section 564(b)(1) of the Act, 21 U.S.C. section 360bbb-3(b)(1), unless the authorization is terminated or revoked.  Performed at [facility], [place] [address] [place], [place] [address]           Radiology Studies: DG CHEST PORT 1 VIEW  Result Date: 12/05/2021 CLINICAL DATA:  Left hip fracture. Surgery planned in 3 days. Fever today. EXAM: PORTABLE CHEST 1 VIEW COMPARISON:  12/02/2021. FINDINGS: Cardiac silhouette is normal in size. Normal mediastinal and hilar contours. Clear lungs.  No pleural effusion or pneumothorax. Stable changes from prior left breast surgery. Skeletal structures are grossly intact. IMPRESSION: No active disease. Electronically Signed   By: Lajean Manes M.D.   On: 12/05/2021 15:48   ECHOCARDIOGRAM COMPLETE  Result Date: 12/04/2021    ECHOCARDIOGRAM REPORT   Patient Name:   Jeyla SUE Hakimi Date of Exam: 12/04/2021 Medical Rec #:  [id number]            Height:       66.0 in Accession #:    [id number]           Weight:       143.5 lb Date of Birth:  09-17-1956             BSA:          1.737 m Patient Age:    76 years             BP:           144/79 mmHg Patient Gender: F                     HR:           90 bpm. Exam Location:  Inpatient Procedure: 2D Echo, Cardiac Doppler and Color Doppler Indications:    Palpitations [785.1.ICD-9-CM]  History:        Patient has no prior history of Echocardiogram examinations.  Left hip fracture. Past history of breast cancer.  Sonographer:    Darlina Sicilian RDCS Referring Phys: [phone] Corby Villasenor V Betul Brisky  Sonographer Comments: Limited motion due to left hip fracture. IMPRESSIONS  1. Left ventricular ejection fraction, by estimation, is 60 to 65%. The left ventricle has normal function. The left ventricle has no regional wall motion abnormalities. Left ventricular diastolic parameters were normal.  2. Right ventricular systolic function is normal. The right ventricular size is normal.  3. The mitral valve is normal in structure. No evidence of mitral valve regurgitation. No evidence of mitral stenosis.  4. The aortic valve is normal in structure. Aortic valve regurgitation is not visualized. No aortic stenosis is present.  5. The inferior vena cava is normal in size with greater than 50% respiratory variability, suggesting right atrial pressure of 3 mmHg. FINDINGS  Left Ventricle: Left ventricular ejection fraction, by estimation, is 60 to 65%. The left ventricle has normal function. The left ventricle has no regional wall motion abnormalities. The left ventricular internal cavity size was normal in size. There is  no left ventricular hypertrophy. Left ventricular diastolic parameters were normal. Right Ventricle: The right ventricular size is normal. No increase in right ventricular wall thickness. Right ventricular systolic function is normal. Left Atrium: Left atrial size was normal in size. Right Atrium: Right atrial size was normal in size. Pericardium: There is no evidence of pericardial effusion. Mitral Valve: The mitral valve is normal in structure. No evidence of mitral valve regurgitation. No evidence of mitral valve stenosis.  Tricuspid Valve: The tricuspid valve is normal in structure. Tricuspid valve regurgitation is not demonstrated. No evidence of tricuspid stenosis. Aortic Valve: The aortic valve is normal in structure. Aortic valve regurgitation is not visualized. No aortic stenosis is present. Pulmonic Valve: The pulmonic valve was normal in structure. Pulmonic valve regurgitation is not visualized. No evidence of pulmonic stenosis. Aorta: The aortic root is normal in size and structure. Venous: The inferior vena cava is normal in size with greater than 50% respiratory variability, suggesting right atrial pressure of 3 mmHg. IAS/Shunts: No atrial level shunt detected by color flow Doppler.  LEFT VENTRICLE PLAX 2D LVIDd:         4.00 cm   Diastology LVIDs:         2.90 cm   LV e' medial:    9.25 cm/s LV PW:         0.90 cm   LV E/e' medial:  7.2 LV IVS:        1.00 cm   LV e' lateral:   7.83 cm/s LVOT diam:     2.00 cm   LV E/e' lateral: 8.5 LV SV:         87 LV SV Index:   50 LVOT Area:     3.14 cm  RIGHT VENTRICLE RV S prime:     18.00 cm/s TAPSE (M-mode): 1.5 cm LEFT ATRIUM           Index LA diam:      3.00 cm 1.73 cm/m LA Vol (A2C): 23.6 ml 13.59 ml/m LA Vol (A4C): 22.9 ml 13.19 ml/m  AORTIC VALVE LVOT Vmax:   148.00 cm/s LVOT Vmean:  92.600 cm/s LVOT VTI:    0.277 m  AORTA Ao Root diam: 3.10 cm MITRAL VALVE MV Area (PHT): 2.02 cm    SHUNTS MV Decel Time: 375 msec    Systemic VTI:  0.28 m MV E velocity: 66.40 cm/s  Systemic Diam: 2.00 cm  MV A velocity: 66.00 cm/s MV E/A ratio:  1.01 Jenkins Rouge MD Electronically signed by Jenkins Rouge MD Signature Date/Time: 12/04/2021/11:21:14 AM    Final         Scheduled Meds: Continuous Infusions:  sodium chloride 125 mL/hr at 12/05/21 0918   cefTRIAXone (ROCEPHIN)  IV 2 g (12/05/21 1126)     LOS: 3 days    Time spent: 35 minutes    Irine Seal, MD [facility]   To contact the attending provider between 7A-7P or the covering provider during after  hours 7P-7A, please log into the web site [email or web address] and access using universal [place] password for that web site. If you do not have the password, please call the hospital operator.  12/05/2021, 4:19 PM

## 2021-12-05 NOTE — Plan of Care (Signed)

## 2021-12-06 LAB — CBC WITH DIFFERENTIAL/PLATELET
Abs Immature Granulocytes: 0.02 10*3/uL (ref 0.00–0.07)
Basophils Absolute: 0 10*3/uL (ref 0.0–0.1)
Basophils Relative: 1 %
Eosinophils Absolute: 0.1 10*3/uL (ref 0.0–0.5)
Eosinophils Relative: 2 %
HCT: 37.4 % (ref 36.0–46.0)
Hemoglobin: 12.4 g/dL (ref 12.0–15.0)
Immature Granulocytes: 0 %
Lymphocytes Relative: 21 %
Lymphs Abs: 1 10*3/uL (ref 0.7–4.0)
MCH: 27.9 pg (ref 26.0–34.0)
MCHC: 33.2 g/dL (ref 30.0–36.0)
MCV: 84 fL (ref 80.0–100.0)
Monocytes Absolute: 0.6 10*3/uL (ref 0.1–1.0)
Monocytes Relative: 12 %
Neutro Abs: 3.2 10*3/uL (ref 1.7–7.7)
Neutrophils Relative %: 64 %
Platelets: 206 10*3/uL (ref 150–400)
RBC: 4.45 MIL/uL (ref 3.87–5.11)
RDW: 13.6 % (ref 11.5–15.5)
WBC: 4.9 10*3/uL (ref 4.0–10.5)
nRBC: 0 % (ref 0.0–0.2)

## 2021-12-06 LAB — BASIC METABOLIC PANEL
Anion gap: 4 — ABNORMAL LOW (ref 5–15)
BUN: 7 mg/dL — ABNORMAL LOW (ref 8–23)
CO2: 26 mmol/L (ref 22–32)
Calcium: 8.5 mg/dL — ABNORMAL LOW (ref 8.9–10.3)
Chloride: 104 mmol/L (ref 98–111)
Creatinine, Ser: 0.41 mg/dL — ABNORMAL LOW (ref 0.44–1.00)
GFR, Estimated: >60 mL/min (ref >60)
Glucose, Bld: 106 mg/dL — ABNORMAL HIGH (ref 70–99)
Potassium: 3.9 mmol/L (ref 3.5–5.1)
Sodium: 134 mmol/L — ABNORMAL LOW (ref 135–145)

## 2021-12-06 LAB — URINE CULTURE: Culture: NO GROWTH

## 2021-12-06 LAB — MAGNESIUM: Magnesium: 1.9 mg/dL (ref 1.7–2.4)

## 2021-12-06 MED ORDER — SENNOSIDES-DOCUSATE SODIUM 8.6-50 MG PO TABS
1.0000 | ORAL_TABLET | Freq: Two times a day (BID) | ORAL | Status: DC
  Administered 2021-12-06: 1 via ORAL
  Filled 2021-12-06 (×3): qty 1

## 2021-12-06 MED ORDER — SODIUM CHLORIDE 0.9 % IV SOLN: INTRAVENOUS | Status: DC | PRN

## 2021-12-06 NOTE — Plan of Care (Signed)
°  Problem: Clinical Measurements: Goal: Ability to maintain clinical measurements within normal limits will improve Outcome: Progressing   Problem: Clinical Measurements: Goal: Will remain free from infection Outcome: Progressing   Problem: Nutrition: Goal: Adequate nutrition will be maintained Outcome: Progressing   Problem: Elimination: Goal: Will not experience complications related to bowel motility Outcome: Progressing

## 2021-12-06 NOTE — Progress Notes (Signed)
PROGRESS NOTE    Isabel [name]  MVH:[id number] DOB: [birth year] DOA: 12/02/2021 PCP: Caren Macadam, MD    Chief Complaint  Patient presents with   Hip Pain    Brief Narrative: Patient is a 66 year old female history of osteoarthritis with bilateral hip resurfacing and possible metal reaction, history of breast cancer presenting to the ED when walking felt a pop in the left hip with immediate pain and inability to bear weight.  Patient denies any fall.  No syncopal episode.  No chest pain.  Patient seen in the ED plain films and consistent with a left femoral neck fracture.  Patient admitted.  Orthopedics consulted for further evaluation.   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 -Plain films consistent with left femoral neck fracture. 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  Commuted left femoral neck fracture with a fracture cleft extending along the medial aspect into the proximal diaphysis. -Preop EKG with normal sinus rhythm. -Patient with no chest pain.  No history of coronary artery disease. -Patient did state had been having some intermittent palpitations and was scheduled for outpatient 2D echo. -2D echo with a EF of 60-65%, NWMA.  -Patient seen in consultation by  orthopedics who planning THA revision on Wednesday 12/08/2021, per Dr. Lyla Glassing. -It is noted by orthopedics note that they called [facility] at patient's request and spoke to Daisey Must who was unable to complete surgery in a more timely fashion as no orthopedics beds available. -Per orthopedics.  2.  Fall ruled out.  3.  Hyponatremia -Likely secondary to hypovolemic hyponatremia as urine sodium noted to be < 10.  Urinalysis with concerns for possible UTI.   -Sodium levels improved with hydration. -Saline lock IV fluids.  -Repeat labs in AM.   4.  Fever/probable UTI -Patient with some complaints of suprapubic abdominal discomfort. -Urinalysis done with large leukocytes, nitrite negative, 6-10 WBCs. -Urine cultures with no growth to date.  -Patient noted to have a fever of 101.4 (12/05/2021). -Blood cultures pending.  -Chest x-ray with no acute infiltrate.  -Continue empiric IV antibiotics and if blood cultures remain negative after 3 days could likely discontinue antibiotics and monitor off antibiotics.  -Incentive spirometry, flutter valve.    5.  Hypokalemia -Repleted.  Potassium at 3.9.     DVT prophylaxis: SCDs preoperative Code Status: Full Family Communication: Updated patient.  No family at bedside. Disposition:   Status is: Inpatient  Remains inpatient appropriate because: Severity of illness.       Consultants:  Orthopedics: Hilbert Odor, PA 12/03/2021  Procedures:  CT left hip 12/03/2021 Plain films of the left knee 12/03/2021 Chest x-ray 12/02/2021 Plain films of the left hip and pelvis 12/02/2021. 2D echo 12/04/2021   Antimicrobials:  IV Rocephin 12/05/2021   Subjective: Laying in bed.  Feeling better today  than she did yesterday.  No further chills.  No chest pain.  No shortness of breath.  No abdominal pain.  Asking whether she is still scheduled for surgery on Wednesday.     Objective: Vitals:   12/05/21 1618 12/05/21 2028 12/06/21 0626 12/06/21 1332   BP:  122/64 120/74 122/67  Pulse:  87 84 80  Resp:  14 18 18   Temp: (!) 100.8 F (38.2 C) 98.9 F (37.2 C) 98.2 F (36.8 C) 98.7 F (37.1 C)  TempSrc: Oral Oral Oral Oral  SpO2:  99% 96% 97%  Weight:   65.5 kg   Height:        Intake/Output Summary (Last 24 hours) at 12/06/2021 1342 Last data filed at 12/06/2021 1238 Gross per 24 hour  Intake 2566.92 ml  Output 4500 ml  Net -1933.08 ml    Filed Weights   12/02/21 1958 12/03/21 0500 12/06/21 0626  Weight: 65.8 kg 65.1 kg 65.5 kg    Examination:  General exam: NAD. Respiratory system: Lungs clear to auscultation bilaterally anterior lung fields.  No wheezes, no crackles, no rhonchi.  Normal respiratory effort.    Cardiovascular system: Regular rate and rhythm no murmurs rubs or gallops.  No JVD.  No lower extremity edema.  Gastrointestinal system: Abdomen is soft, nontender, nondistended, positive bowel sounds.  No rebound.  No guarding.   Central nervous system: Alert and oriented. No focal neurological deficits. Extremities: Left lower extremity shortened and externally rotated.  Nontender to palpation on the left hip area.  Skin: No rashes, lesions or ulcers Psychiatry: Judgement and insight appear normal. Mood & affect appropriate.     Data Reviewed: I have personally reviewed following labs and imaging studies  CBC: Recent Labs  Lab 12/02/21 2043 12/03/21 0101 12/06/21 0333  WBC 7.5 8.0 4.9  NEUTROABS 4.9 5.5 3.2  HGB 13.0 13.1 12.4  HCT 39.9 39.1 37.4  MCV 85.3 84.1 84.0  PLT 231 214 206     Basic Metabolic Panel: Recent Labs  Lab 12/02/21 2043 12/03/21 0101 12/04/21 0343 12/05/21 0315 12/06/21 0333  NA 138 136 129* 131* 134*  K 3.6 3.6 3.8 3.4* 3.9  CL 105 106 94* 98 104  CO2 21* 25 27 27 26   GLUCOSE 101* 104* 102* 101* 106*  BUN 14 11 14 9  7*  CREATININE 0.48 0.51 0.42* 0.45 0.41*  CALCIUM 9.4 8.7* 9.2 8.9 8.5*  MG  --  2.4 2.0  --  1.9     GFR: Estimated Creatinine Clearance: 66.5  mL/min (A) (by C-G formula based on SCr of 0.41 mg/dL (L)).  Liver Function Tests: Recent Labs  Lab 12/03/21 0101  AST 23  ALT 20  ALKPHOS 63  BILITOT 0.7  PROT 6.9  ALBUMIN 4.3     CBG: No results for input(s): GLUCAP in the last 168 hours.   Recent Results (from the past 240 hour(s))  Resp Panel by RT-PCR (Flu A&B, Covid) Nasopharyngeal Swab     Status: None   Collection Time: 12/02/21  9:36 PM   Specimen: Nasopharyngeal Swab; Nasopharyngeal(NP) swabs in vial transport medium  Result Value Ref Range Status   SARS Coronavirus 2 by RT PCR NEGATIVE NEGATIVE Final    Comment: (NOTE) SARS-CoV-2 target nucleic acids are NOT DETECTED.  The SARS-CoV-2 RNA is generally detectable in upper respiratory specimens during the acute phase of infection. The lowest concentration of SARS-CoV-2 viral copies this assay can detect is 138 copies/mL. A negative result does not preclude SARS-Cov-2  infection and should not be used as the sole basis for treatment or other patient management decisions. A negative result may occur with  improper specimen collection/handling, submission of specimen other than nasopharyngeal swab, presence of viral mutation(s) within the areas targeted by this assay, and inadequate number of viral copies(<138 copies/mL). A negative result must be combined with clinical observations, patient history, and epidemiological information. The expected result is Negative.  Fact Sheet for Patients:  EntrepreneurPulse.com.au  Fact Sheet for Healthcare Providers:  IncredibleEmployment.be  This test is no t yet approved or cleared by the [place] FDA and  has been authorized for detection and/or diagnosis of SARS-CoV-2 by FDA under an Emergency Use Authorization (EUA). This EUA will remain  in effect (meaning this test can be used) for the duration of the COVID-19 declaration under Section 564(b)(1) of the Act, 21 U.S.C.section  360bbb-3(b)(1), unless the authorization is terminated  or revoked sooner.       Influenza A by PCR NEGATIVE NEGATIVE Final   Influenza B by PCR NEGATIVE NEGATIVE Final    Comment: (NOTE) The Xpert Xpress SARS-CoV-2/FLU/RSV plus assay is intended as an aid in the diagnosis of influenza from Nasopharyngeal swab specimens and should not be used as a sole basis for treatment. Nasal washings and aspirates are unacceptable for Xpert Xpress SARS-CoV-2/FLU/RSV testing.  Fact Sheet for Patients: EntrepreneurPulse.com.au  Fact Sheet for Healthcare Providers: IncredibleEmployment.be  This test is not yet approved or cleared by the [place] FDA and has been authorized for detection and/or diagnosis of SARS-CoV-2 by FDA under an Emergency Use Authorization (EUA). This EUA will remain in effect (meaning this test can be used) for the duration of the COVID-19 declaration under Section 564(b)(1) of the Act, 21 U.S.C. section 360bbb-3(b)(1), unless the authorization is terminated or revoked.  Performed at [facility], [place] [address], [place], [place] [address]   Urine Culture     Status: None   Collection Time: 12/05/21  9:00 AM   Specimen: Urine, Clean Catch  Result Value Ref Range Status   Specimen Description   Final    URINE, CLEAN CATCH Performed at [facility], [place] [address] [place], [place] [address]    Special Requests   Final    NONE Performed at [facility], [place] [address], [place], [place] [address]    Culture   Final    NO GROWTH Performed at [facility], [place] [address], [place], [place] [address]    Report Status 12/06/2021 FINAL  Final  Culture, blood (Routine X 2) w Reflex to ID Panel     Status: None (Preliminary result)   Collection Time: 12/05/21  4:43 PM   Specimen: BLOOD  Result Value Ref Range Status   Specimen Description   Final    BLOOD RIGHT  ANTECUBITAL Performed at [place] [address], [place], [place] [address]    Special Requests   Final    BOTTLES DRAWN AEROBIC ONLY Blood Culture adequate volume Performed at [place] [address] [place], [place] [address]    Culture   Final    NO GROWTH < 24 HOURS Performed at [place] [address], [place], [place] [address]    Report Status PENDING  Incomplete  Culture, blood (Routine X 2) w Reflex to ID Panel     Status: None (Preliminary result)   Collection Time: 12/05/21  4:50 PM   Specimen:  BLOOD  Result Value Ref Range Status   Specimen Description   Final    BLOOD RIGHT ANTECUBITAL Performed at [place] [address] [place], [place] [address]    Special Requests   Final    BOTTLES DRAWN AEROBIC ONLY Blood Culture adequate volume Performed at [place] [address] [place], [place] [address]    Culture   Final    NO GROWTH < 24 HOURS Performed at [place] [address], [place],  [address]    Report Status PENDING  Incomplete          Radiology Studies: DG CHEST PORT 1 VIEW  Result Date: 12/05/2021 CLINICAL DATA:  Left hip fracture. Surgery planned in 3 days. Fever today. EXAM: PORTABLE CHEST 1 VIEW COMPARISON:  12/02/2021. FINDINGS: Cardiac silhouette is normal in size. Normal mediastinal and hilar contours. Clear lungs.  No pleural effusion or pneumothorax. Stable changes from prior left breast surgery. Skeletal structures are grossly intact. IMPRESSION: No active disease. Electronically Signed   By: Lajean Manes M.D.   On: 12/05/2021 15:48        Scheduled Meds:  senna-docusate  1 tablet Oral BID   Continuous Infusions:  sodium chloride 10 mL/hr at 12/06/21 0948   cefTRIAXone (ROCEPHIN)  IV 2 g (12/06/21 0949)     LOS: 4 days    Time spent: 35 minutes    Irine Seal, MD [facility]   To  contact the attending provider between 7A-7P or the covering provider during after hours 7P-7A, please log into the web site [email or web address] and access using universal [place] password for that web site. If you do not have the password, please call the hospital operator.  12/06/2021, 1:42 PM

## 2021-12-07 DIAGNOSIS — S72002P Fracture of unspecified part of neck of left femur, subsequent encounter for closed fracture with malunion: Secondary | ICD-10-CM | POA: Diagnosis not present

## 2021-12-07 DIAGNOSIS — N390 Urinary tract infection, site not specified: Secondary | ICD-10-CM | POA: Diagnosis not present

## 2021-12-07 LAB — CBC
HCT: 38.2 % (ref 36.0–46.0)
Hemoglobin: 12.5 g/dL (ref 12.0–15.0)
MCH: 27.5 pg (ref 26.0–34.0)
MCHC: 32.7 g/dL (ref 30.0–36.0)
MCV: 84 fL (ref 80.0–100.0)
Platelets: 220 K/uL (ref 150–400)
RBC: 4.55 MIL/uL (ref 3.87–5.11)
RDW: 13.7 % (ref 11.5–15.5)
WBC: 5.3 K/uL (ref 4.0–10.5)
nRBC: 0 % (ref 0.0–0.2)

## 2021-12-07 LAB — BASIC METABOLIC PANEL
Anion gap: 5 (ref 5–15)
BUN: 8 mg/dL (ref 8–23)
CO2: 27 mmol/L (ref 22–32)
Calcium: 8.7 mg/dL — ABNORMAL LOW (ref 8.9–10.3)
Chloride: 101 mmol/L (ref 98–111)
Creatinine, Ser: 0.5 mg/dL (ref 0.44–1.00)
GFR, Estimated: >60 mL/min (ref >60)
Glucose, Bld: 103 mg/dL — ABNORMAL HIGH (ref 70–99)
Potassium: 4.3 mmol/L (ref 3.5–5.1)
Sodium: 133 mmol/L — ABNORMAL LOW (ref 135–145)

## 2021-12-07 LAB — SURGICAL PCR SCREEN
MRSA, PCR: NEGATIVE
Staphylococcus aureus: NEGATIVE

## 2021-12-07 MED ORDER — POVIDONE-IODINE 10 % EX SWAB
2.0000 "application " | Freq: Once | CUTANEOUS | Status: AC
  Administered 2021-12-08: 2 via TOPICAL

## 2021-12-07 MED ORDER — TRANEXAMIC ACID-NACL 1000-0.7 MG/100ML-% IV SOLN
1000.0000 mg | INTRAVENOUS | Status: AC
  Administered 2021-12-08: 1000 mg via INTRAVENOUS
  Filled 2021-12-07: qty 100

## 2021-12-07 MED ORDER — CHLORHEXIDINE GLUCONATE 4 % EX LIQD
60.0000 mL | Freq: Once | CUTANEOUS | Status: AC
  Administered 2021-12-08: 4 via TOPICAL

## 2021-12-07 MED ORDER — CEFAZOLIN SODIUM-DEXTROSE 2-4 GM/100ML-% IV SOLN
2.0000 g | INTRAVENOUS | Status: AC
  Administered 2021-12-08: 2 g via INTRAVENOUS
  Filled 2021-12-07: qty 100

## 2021-12-07 MED ORDER — POVIDONE-IODINE 10 % EX SWAB: 2.0000 "application " | Freq: Once | CUTANEOUS | Status: DC

## 2021-12-07 MED ORDER — VANCOMYCIN HCL IN DEXTROSE 1-5 GM/200ML-% IV SOLN
1000.0000 mg | INTRAVENOUS | Status: AC
  Administered 2021-12-08: 1000 mg via INTRAVENOUS
  Filled 2021-12-07 (×2): qty 200

## 2021-12-07 MED ORDER — MUPIROCIN 2 % EX OINT: 1.0000 "application " | TOPICAL_OINTMENT | Freq: Two times a day (BID) | CUTANEOUS | Status: DC

## 2021-12-07 NOTE — Progress Notes (Signed)
PROGRESS NOTE    Kimbree Casanas Burbano  VOZ:[id number] DOB: 1955/12/27 DOA: 12/02/2021 PCP: Caren Macadam, MD    Chief Complaint  Patient presents with   Hip Pain    Brief Narrative:   Patient is a 66 year old female history of osteoarthritis with bilateral hip resurfacing and possible metal reaction, history of breast cancer presenting to the ED when walking felt a pop in the left hip with immediate pain and inability to bear weight.  Patient denies any fall.  No syncopal episode.  No chest pain.  Patient seen in the ED plain films and consistent with a left femoral neck fracture.  Patient admitted.  Orthopedics consulted for further evaluation.    Assessment & Plan:   Principal Problem:   Closed left hip fracture [facility]) Active Problems:   Left hip pain   Fall at home, initial encounter   Periprosthetic fracture around internal prosthetic hip joint   Fever   Acute lower UTI   Closed left hip fracture : Evident from CT of the left hip.  Pain control.  Ortho consulted and plan for surgical repair.   Hyponatremia:  Hypovolemic hyponatremia.  Improved.   Probably UTI:  Urine cultures are negative.  D.c antibiotics after today's dose.    Hypokalemia:  Replaced.    DVT prophylaxis: SCD's Code Status: (Full Code) Family Communication: family at bedside.  Disposition:   Status is: Inpatient  Remains inpatient appropriate because: IV antibiotics.       Consultants:  Orthopedics.   Procedures: none.   Antimicrobials:  Antibiotics Given (last 72 hours)     Date/Time Action Medication Dose Rate   12/05/21 1126 New Bag/Given   cefTRIAXone (ROCEPHIN) 2 g in sodium chloride 0.9 % 100 mL IVPB 2 g 200 mL/hr   12/06/21 0949 New Bag/Given   cefTRIAXone (ROCEPHIN) 2 g in sodium chloride 0.9 % 100 mL IVPB 2 g 200 mL/hr   12/07/21 0949 New Bag/Given   cefTRIAXone (ROCEPHIN) 2 g in sodium chloride 0.9 % 100 mL IVPB 2 g 200 mL/hr          Subjective: Painful ROM   Objective: Vitals:   12/06/21 2142 12/07/21 0500 12/07/21 0554 12/07/21 1339  BP: 114/62  122/68 119/69  Pulse: 72  72 73  Resp: 16  18 16   Temp: 98.6 F (37 C)  98.8 F (37.1 C) 98.5 F (36.9 C)  TempSrc: Oral  Oral Oral  SpO2: 98%  97% 98%  Weight:  65.3 kg    Height:        Intake/Output Summary (Last 24 hours) at 12/07/2021 1711 Last data filed at 12/07/2021 1608 Gross per 24 hour  Intake 360 ml  Output 3200 ml  Net -2840 ml   Filed Weights   12/03/21 0500 12/06/21 0626 12/07/21 0500  Weight: 65.1 kg 65.5 kg 65.3 kg    Examination:  General exam: Appears calm and comfortable  Respiratory system: Clear to auscultation. Respiratory effort normal. Cardiovascular system: S1 & S2 heard, RRR. No JVD,No pedal edema. Gastrointestinal system: Abdomen is nondistended, soft and nontender. Normal bowel sounds heard. Central nervous system: Alert and oriented. No focal neurological deficits. Extremities: painful ROM.  Skin: No rashes, lesions or ulcers Psychiatry: Mood & affect appropriate.     Data Reviewed: I have personally reviewed following labs and imaging studies  CBC: Recent Labs  Lab 12/02/21 2043 12/03/21 0101 12/06/21 0333 12/07/21 0315  WBC 7.5 8.0 4.9 5.3  NEUTROABS 4.9 5.5 3.2  --  HGB 13.0 13.1 12.4 12.5  HCT 39.9 39.1 37.4 38.2  MCV 85.3 84.1 84.0 84.0  PLT 231 214 206 102    Basic Metabolic Panel: Recent Labs  Lab 12/03/21 0101 12/04/21 0343 12/05/21 0315 12/06/21 0333 12/07/21 0315  NA 136 129* 131* 134* 133*  K 3.6 3.8 3.4* 3.9 4.3  CL 106 94* 98 104 101  CO2 25 27 27 26 27   GLUCOSE 104* 102* 101* 106* 103*  BUN 11 14 9  7* 8  CREATININE 0.51 0.42* 0.45 0.41* 0.50  CALCIUM 8.7* 9.2 8.9 8.5* 8.7*  MG 2.4 2.0  --  1.9  --     GFR: Estimated Creatinine Clearance: 66.5 mL/min (by C-G formula based on SCr of 0.5 mg/dL).  Liver Function Tests: Recent Labs  Lab 12/03/21 0101  AST 23  ALT 20   ALKPHOS 63  BILITOT 0.7  PROT 6.9  ALBUMIN 4.3    CBG: No results for input(s): GLUCAP in the last 168 hours.   Recent Results (from the past 240 hour(s))  Resp Panel by RT-PCR (Flu A&B, Covid) Nasopharyngeal Swab     Status: None   Collection Time: 12/02/21  9:36 PM   Specimen: Nasopharyngeal Swab; Nasopharyngeal(NP) swabs in vial transport medium  Result Value Ref Range Status   SARS Coronavirus 2 by RT PCR NEGATIVE NEGATIVE Final    Comment: (NOTE) SARS-CoV-2 target nucleic acids are NOT DETECTED.  The SARS-CoV-2 RNA is generally detectable in upper respiratory specimens during the acute phase of infection. The lowest concentration of SARS-CoV-2 viral copies this assay can detect is 138 copies/mL. A negative result does not preclude SARS-Cov-2 infection and should not be used as the sole basis for treatment or other patient management decisions. A negative result may occur with  improper specimen collection/handling, submission of specimen other than nasopharyngeal swab, presence of viral mutation(s) within the areas targeted by this assay, and inadequate number of viral copies(<138 copies/mL). A negative result must be combined with clinical observations, patient history, and epidemiological information. The expected result is Negative.  Fact Sheet for Patients:  EntrepreneurPulse.com.au  Fact Sheet for Healthcare Providers:  IncredibleEmployment.be  This test is no t yet approved or cleared by the [place] FDA and  has been authorized for detection and/or diagnosis of SARS-CoV-2 by FDA under an Emergency Use Authorization (EUA). This EUA will remain  in effect (meaning this test can be used) for the duration of the COVID-19 declaration under Section 564(b)(1) of the Act, 21 U.S.C.section 360bbb-3(b)(1), unless the authorization is terminated  or revoked sooner.       Influenza A by PCR NEGATIVE NEGATIVE Final   Influenza  B by PCR NEGATIVE NEGATIVE Final    Comment: (NOTE) The Xpert Xpress SARS-CoV-2/FLU/RSV plus assay is intended as an aid in the diagnosis of influenza from Nasopharyngeal swab specimens and should not be used as a sole basis for treatment. Nasal washings and aspirates are unacceptable for Xpert Xpress SARS-CoV-2/FLU/RSV testing.  Fact Sheet for Patients: EntrepreneurPulse.com.au  Fact Sheet for Healthcare Providers: IncredibleEmployment.be  This test is not yet approved or cleared by the [place] FDA and has been authorized for detection and/or diagnosis of SARS-CoV-2 by FDA under an Emergency Use Authorization (EUA). This EUA will remain in effect (meaning this test can be used) for the duration of the COVID-19 declaration under Section 564(b)(1) of the Act, 21 U.S.C. section 360bbb-3(b)(1), unless the authorization is terminated or revoked.  Performed at [facility], [place]  [address], [place], [place] [address]   Urine Culture     Status: None   Collection Time: 12/05/21  9:00 AM   Specimen: Urine, Clean Catch  Result Value Ref Range Status   Specimen Description   Final    URINE, CLEAN CATCH Performed at [facility], [place] [address] [place], [place] [address]    Special Requests   Final    NONE Performed at [facility], [place] [address], [place], [place] [address]    Culture   Final    NO GROWTH Performed at [facility], [place] [address] [place], [place] [address]    Report Status 12/06/2021 FINAL  Final  Culture, blood (Routine X 2) w Reflex to ID Panel     Status: None (Preliminary result)   Collection Time: 12/05/21  4:43 PM   Specimen: BLOOD  Result Value Ref Range Status   Specimen Description   Final    BLOOD RIGHT ANTECUBITAL Performed at [place] [address], [place], [place] [address]    Special Requests   Final    BOTTLES  DRAWN AEROBIC ONLY Blood Culture adequate volume Performed at [place] [address], [place], [place] [address]    Culture   Final    NO GROWTH 2 DAYS Performed at [place] [address] [place], [place] [address]    Report Status PENDING  Incomplete  Culture, blood (Routine X 2) w Reflex to ID Panel     Status: None (Preliminary result)   Collection Time: 12/05/21  4:50 PM   Specimen: BLOOD  Result Value Ref Range Status   Specimen Description   Final    BLOOD RIGHT ANTECUBITAL Performed at [place] [address] [place], [place] [address]    Special Requests   Final    BOTTLES DRAWN AEROBIC ONLY Blood Culture adequate volume Performed at [place] [address], [place], [place] [address]    Culture   Final    NO GROWTH 2 DAYS Performed at [place] [address] [place], [place] [address]    Report Status PENDING  Incomplete         Radiology Studies: No results found.      Scheduled Meds:  senna-docusate  1 tablet Oral BID   Continuous Infusions:  sodium chloride 10 mL/hr at 12/06/21 0948   cefTRIAXone (ROCEPHIN)  IV 2 g (12/07/21 0949)     LOS: 5 days        Hosie Poisson, MD [facility]   To contact the attending provider between 7A-7P or the covering provider during after hours 7P-7A, please log into the web site [email or web address] and access using universal [place] password for that web site. If you do not have the password, please call the hospital operator.  12/07/2021, 5:11 PM

## 2021-12-08 ENCOUNTER — Inpatient Hospital Stay (HOSPITAL_COMMUNITY): Payer: 59 | Admitting: Certified Registered"

## 2021-12-08 ENCOUNTER — Encounter (HOSPITAL_COMMUNITY)
Admission: EM | Disposition: A | Discharge status: Home / Self Care | Payer: Self-pay | Source: Home / Self Care | Attending: Internal Medicine

## 2021-12-08 ENCOUNTER — Inpatient Hospital Stay (HOSPITAL_COMMUNITY): Payer: 59

## 2021-12-08 ENCOUNTER — Encounter (HOSPITAL_COMMUNITY): Payer: Self-pay | Admitting: Internal Medicine

## 2021-12-08 HISTORY — PX: TOTAL HIP ARTHROPLASTY: SHX124

## 2021-12-08 LAB — CBC WITH DIFFERENTIAL/PLATELET
Abs Immature Granulocytes: 0.01 10*3/uL (ref 0.00–0.07)
Basophils Absolute: 0 10*3/uL (ref 0.0–0.1)
Basophils Relative: 1 %
Eosinophils Absolute: 0.2 10*3/uL (ref 0.0–0.5)
Eosinophils Relative: 4 %
HCT: 40 % (ref 36.0–46.0)
Hemoglobin: 13.6 g/dL (ref 12.0–15.0)
Immature Granulocytes: 0 %
Lymphocytes Relative: 25 %
Lymphs Abs: 1.2 10*3/uL (ref 0.7–4.0)
MCH: 27.6 pg (ref 26.0–34.0)
MCHC: 34 g/dL (ref 30.0–36.0)
MCV: 81.3 fL (ref 80.0–100.0)
Monocytes Absolute: 0.5 10*3/uL (ref 0.1–1.0)
Monocytes Relative: 11 %
Neutro Abs: 2.7 10*3/uL (ref 1.7–7.7)
Neutrophils Relative %: 59 %
Platelets: 240 10*3/uL (ref 150–400)
RBC: 4.92 MIL/uL (ref 3.87–5.11)
RDW: 13.2 % (ref 11.5–15.5)
WBC: 4.6 10*3/uL (ref 4.0–10.5)
nRBC: 0 % (ref 0.0–0.2)

## 2021-12-08 LAB — BASIC METABOLIC PANEL
Anion gap: 9 (ref 5–15)
BUN: 9 mg/dL (ref 8–23)
CO2: 25 mmol/L (ref 22–32)
Calcium: 9.2 mg/dL (ref 8.9–10.3)
Chloride: 101 mmol/L (ref 98–111)
Creatinine, Ser: 0.4 mg/dL — ABNORMAL LOW (ref 0.44–1.00)
GFR, Estimated: >60 mL/min (ref >60)
Glucose, Bld: 103 mg/dL — ABNORMAL HIGH (ref 70–99)
Potassium: 3.8 mmol/L (ref 3.5–5.1)
Sodium: 135 mmol/L (ref 135–145)

## 2021-12-08 LAB — MAGNESIUM: Magnesium: 2.2 mg/dL (ref 1.7–2.4)

## 2021-12-08 SURGERY — ARTHROPLASTY, HIP, TOTAL, ANTERIOR APPROACH
Anesthesia: Monitor Anesthesia Care | Site: Hip | Laterality: Left

## 2021-12-08 MED ORDER — FENTANYL CITRATE PF 50 MCG/ML IJ SOSY
PREFILLED_SYRINGE | INTRAMUSCULAR | Status: AC
  Filled 2021-12-08: qty 1

## 2021-12-08 MED ORDER — ONDANSETRON HCL 4 MG/2ML IJ SOLN
INTRAMUSCULAR | Status: DC | PRN
Start: 2021-12-08 — End: 2021-12-08
  Administered 2021-12-08: 4 mg via INTRAVENOUS

## 2021-12-08 MED ORDER — CEFAZOLIN SODIUM-DEXTROSE 2-4 GM/100ML-% IV SOLN
2.0000 g | Freq: Four times a day (QID) | INTRAVENOUS | Status: AC
  Administered 2021-12-08 – 2021-12-09 (×2): 2 g via INTRAVENOUS
  Filled 2021-12-08 (×2): qty 100

## 2021-12-08 MED ORDER — ISOPROPYL ALCOHOL 70 % SOLN
Status: DC | PRN
  Administered 2021-12-08: 1 via TOPICAL

## 2021-12-08 MED ORDER — ALBUMIN HUMAN 5 % IV SOLN
INTRAVENOUS | Status: AC
  Filled 2021-12-08: qty 250

## 2021-12-08 MED ORDER — SODIUM CHLORIDE 0.9 % IV SOLN: INTRAVENOUS | Status: DC

## 2021-12-08 MED ORDER — MENTHOL 3 MG MT LOZG: 1.0000 | LOZENGE | OROMUCOSAL | Status: DC | PRN

## 2021-12-08 MED ORDER — POLYETHYLENE GLYCOL 3350 17 G PO PACK: 17.0000 g | PACK | Freq: Every day | ORAL | Status: DC | PRN

## 2021-12-08 MED ORDER — PROPOFOL 1000 MG/100ML IV EMUL
INTRAVENOUS | Status: AC
  Filled 2021-12-08: qty 100

## 2021-12-08 MED ORDER — PHENOL 1.4 % MT LIQD: 1.0000 | OROMUCOSAL | Status: DC | PRN

## 2021-12-08 MED ORDER — PHENYLEPHRINE HCL-NACL 20-0.9 MG/250ML-% IV SOLN
INTRAVENOUS | Status: DC | PRN
  Administered 2021-12-08: 25 ug/min via INTRAVENOUS

## 2021-12-08 MED ORDER — MIDAZOLAM HCL 2 MG/2ML IJ SOLN
INTRAMUSCULAR | Status: AC
  Filled 2021-12-08: qty 2

## 2021-12-08 MED ORDER — DEXAMETHASONE SODIUM PHOSPHATE 10 MG/ML IJ SOLN
INTRAMUSCULAR | Status: DC | PRN
Start: 2021-12-08 — End: 2021-12-08
  Administered 2021-12-08: 10 mg via INTRAVENOUS

## 2021-12-08 MED ORDER — FENTANYL CITRATE (PF) 100 MCG/2ML IJ SOLN
INTRAMUSCULAR | Status: AC
  Filled 2021-12-08: qty 2

## 2021-12-08 MED ORDER — METHOCARBAMOL 500 MG PO TABS: 500.0000 mg | ORAL_TABLET | Freq: Four times a day (QID) | ORAL | Status: DC | PRN

## 2021-12-08 MED ORDER — ONDANSETRON HCL 4 MG/2ML IJ SOLN: 4.0000 mg | Freq: Four times a day (QID) | INTRAMUSCULAR | Status: DC | PRN

## 2021-12-08 MED ORDER — DIPHENHYDRAMINE HCL 12.5 MG/5ML PO ELIX: 12.5000 mg | ORAL_SOLUTION | ORAL | Status: DC | PRN

## 2021-12-08 MED ORDER — HYDROMORPHONE HCL 1 MG/ML IJ SOLN: 0.5000 mg | INTRAMUSCULAR | Status: DC | PRN

## 2021-12-08 MED ORDER — MIDAZOLAM HCL 2 MG/2ML IJ SOLN
INTRAMUSCULAR | Status: DC | PRN
  Administered 2021-12-08: 1 mg via INTRAVENOUS

## 2021-12-08 MED ORDER — DEXMEDETOMIDINE (PRECEDEX) IN NS 20 MCG/5ML (4 MCG/ML) IV SYRINGE
PREFILLED_SYRINGE | INTRAVENOUS | Status: DC | PRN
  Administered 2021-12-08: 8 ug via INTRAVENOUS

## 2021-12-08 MED ORDER — LACTATED RINGERS IV SOLN: INTRAVENOUS | Status: DC | PRN

## 2021-12-08 MED ORDER — WATER FOR IRRIGATION, STERILE IR SOLN
Status: DC | PRN
  Administered 2021-12-08: 2000 mL

## 2021-12-08 MED ORDER — PROMETHAZINE HCL 25 MG/ML IJ SOLN: 6.2500 mg | INTRAMUSCULAR | Status: DC | PRN

## 2021-12-08 MED ORDER — PROPOFOL 10 MG/ML IV BOLUS
INTRAVENOUS | Status: AC
  Filled 2021-12-08: qty 20

## 2021-12-08 MED ORDER — PROPOFOL 500 MG/50ML IV EMUL
INTRAVENOUS | Status: DC | PRN
Start: 2021-12-08 — End: 2021-12-08
  Administered 2021-12-08: 125 ug/kg/min via INTRAVENOUS

## 2021-12-08 MED ORDER — SENNA 8.6 MG PO TABS
1.0000 | ORAL_TABLET | Freq: Two times a day (BID) | ORAL | Status: DC
  Filled 2021-12-08: qty 1

## 2021-12-08 MED ORDER — SODIUM CHLORIDE 0.9 % IR SOLN
Status: DC | PRN
  Administered 2021-12-08: 1000 mL

## 2021-12-08 MED ORDER — FENTANYL CITRATE PF 50 MCG/ML IJ SOSY
25.0000 ug | PREFILLED_SYRINGE | INTRAMUSCULAR | Status: DC | PRN
  Administered 2021-12-08 (×2): 25 ug via INTRAVENOUS
  Administered 2021-12-08: 50 ug via INTRAVENOUS

## 2021-12-08 MED ORDER — BUPIVACAINE-EPINEPHRINE 0.25% -1:200000 IJ SOLN
INTRAMUSCULAR | Status: DC | PRN
  Administered 2021-12-08: 30 mL

## 2021-12-08 MED ORDER — DOCUSATE SODIUM 100 MG PO CAPS
100.0000 mg | ORAL_CAPSULE | Freq: Two times a day (BID) | ORAL | Status: DC
  Filled 2021-12-08 (×2): qty 1

## 2021-12-08 MED ORDER — LACTATED RINGERS IV SOLN: INTRAVENOUS | Status: DC

## 2021-12-08 MED ORDER — LIDOCAINE 2% (20 MG/ML) 5 ML SYRINGE
INTRAMUSCULAR | Status: DC | PRN
  Administered 2021-12-08: 40 mg via INTRAVENOUS

## 2021-12-08 MED ORDER — ACETAMINOPHEN 325 MG PO TABS
325.0000 mg | ORAL_TABLET | Freq: Four times a day (QID) | ORAL | Status: DC | PRN
  Administered 2021-12-09 – 2021-12-12 (×5): 650 mg via ORAL
  Filled 2021-12-08 (×6): qty 2

## 2021-12-08 MED ORDER — CHLORHEXIDINE GLUCONATE CLOTH 2 % EX PADS: 6.0000 | MEDICATED_PAD | Freq: Every day | CUTANEOUS | Status: DC

## 2021-12-08 MED ORDER — OXYCODONE HCL 5 MG PO TABS: 10.0000 mg | ORAL_TABLET | ORAL | Status: DC | PRN

## 2021-12-08 MED ORDER — ONDANSETRON HCL 4 MG PO TABS: 4.0000 mg | ORAL_TABLET | Freq: Four times a day (QID) | ORAL | Status: DC | PRN

## 2021-12-08 MED ORDER — FENTANYL CITRATE (PF) 100 MCG/2ML IJ SOLN
INTRAMUSCULAR | Status: DC | PRN
  Administered 2021-12-08: 50 ug via INTRAVENOUS

## 2021-12-08 MED ORDER — TRANEXAMIC ACID-NACL 1000-0.7 MG/100ML-% IV SOLN
1000.0000 mg | Freq: Once | INTRAVENOUS | Status: AC
  Administered 2021-12-08: 1000 mg via INTRAVENOUS
  Filled 2021-12-08: qty 100

## 2021-12-08 MED ORDER — BUPIVACAINE IN DEXTROSE 0.75-8.25 % IT SOLN
INTRATHECAL | Status: DC | PRN
  Administered 2021-12-08: 1.8 mL via INTRATHECAL

## 2021-12-08 MED ORDER — PHENYLEPHRINE 40 MCG/ML (10ML) SYRINGE FOR IV PUSH (FOR BLOOD PRESSURE SUPPORT)
PREFILLED_SYRINGE | INTRAVENOUS | Status: DC | PRN
Start: 2021-12-08 — End: 2021-12-08
  Administered 2021-12-08 (×2): 120 ug via INTRAVENOUS

## 2021-12-08 MED ORDER — DEXAMETHASONE SODIUM PHOSPHATE 10 MG/ML IJ SOLN
10.0000 mg | Freq: Once | INTRAMUSCULAR | Status: AC
  Administered 2021-12-09: 10 mg via INTRAVENOUS
  Filled 2021-12-08: qty 1

## 2021-12-08 MED ORDER — METHOCARBAMOL 500 MG IVPB - SIMPLE MED
500.0000 mg | Freq: Four times a day (QID) | INTRAVENOUS | Status: DC | PRN
  Administered 2021-12-08: 500 mg via INTRAVENOUS
  Filled 2021-12-08: qty 50

## 2021-12-08 MED ORDER — ASPIRIN 81 MG PO CHEW
81.0000 mg | CHEWABLE_TABLET | Freq: Two times a day (BID) | ORAL | Status: DC
  Administered 2021-12-09 – 2021-12-13 (×9): 81 mg via ORAL
  Filled 2021-12-08 (×9): qty 1

## 2021-12-08 MED ORDER — OXYCODONE HCL 5 MG PO TABS
5.0000 mg | ORAL_TABLET | ORAL | Status: DC | PRN
  Filled 2021-12-08: qty 2

## 2021-12-08 MED ORDER — PHENYLEPHRINE HCL (PRESSORS) 10 MG/ML IV SOLN
INTRAVENOUS | Status: AC
  Filled 2021-12-08: qty 1

## 2021-12-08 MED ORDER — METHOCARBAMOL 500 MG IVPB - SIMPLE MED
INTRAVENOUS | Status: AC
  Filled 2021-12-08: qty 50

## 2021-12-08 MED ORDER — KETOROLAC TROMETHAMINE 30 MG/ML IJ SOLN
INTRAMUSCULAR | Status: AC
  Filled 2021-12-08: qty 1

## 2021-12-08 MED ORDER — METOCLOPRAMIDE HCL 5 MG PO TABS: 5.0000 mg | ORAL_TABLET | Freq: Three times a day (TID) | ORAL | Status: DC | PRN

## 2021-12-08 MED ORDER — KETOROLAC TROMETHAMINE 30 MG/ML IJ SOLN
INTRAMUSCULAR | Status: DC | PRN
  Administered 2021-12-08: 30 mg via INTRAMUSCULAR

## 2021-12-08 MED ORDER — BUPIVACAINE-EPINEPHRINE (PF) 0.25% -1:200000 IJ SOLN
INTRAMUSCULAR | Status: AC
  Filled 2021-12-08: qty 30

## 2021-12-08 MED ORDER — CELECOXIB 200 MG PO CAPS
200.0000 mg | ORAL_CAPSULE | Freq: Two times a day (BID) | ORAL | Status: DC
  Administered 2021-12-09 – 2021-12-13 (×9): 200 mg via ORAL
  Filled 2021-12-08 (×10): qty 1

## 2021-12-08 MED ORDER — CHLORHEXIDINE GLUCONATE 0.12 % MT SOLN
15.0000 mL | Freq: Once | OROMUCOSAL | Status: AC
  Administered 2021-12-08: 15 mL via OROMUCOSAL

## 2021-12-08 MED ORDER — METOCLOPRAMIDE HCL 5 MG/ML IJ SOLN: 5.0000 mg | Freq: Three times a day (TID) | INTRAMUSCULAR | Status: DC | PRN

## 2021-12-08 MED ORDER — PROPOFOL 10 MG/ML IV BOLUS
INTRAVENOUS | Status: DC | PRN
  Administered 2021-12-08 (×2): 10 mg via INTRAVENOUS
  Administered 2021-12-08: 30 mg via INTRAVENOUS

## 2021-12-08 MED ORDER — ALBUMIN HUMAN 5 % IV SOLN: INTRAVENOUS | Status: DC | PRN

## 2021-12-08 MED ORDER — ALUM & MAG HYDROXIDE-SIMETH 200-200-20 MG/5ML PO SUSP: 30.0000 mL | ORAL | Status: DC | PRN

## 2021-12-08 SURGICAL SUPPLY — 83 items
ACETAB SHELL MULTIHOLE 58 (Hips) ×2 IMPLANT
BAG COUNTER SPONGE SURGICOUNT (BAG) IMPLANT
BAG DECANTER FOR FLEXI CONT (MISCELLANEOUS) IMPLANT
BAG ZIPLOCK 12X15 (MISCELLANEOUS) IMPLANT
BEARING CROSSLINK RSA 28X44 (Joint) ×1 IMPLANT
BIT DRILL RINGLOC 3.2MMX20 (BIT) IMPLANT
BIT DRILL RINGLOC 3.2MMX40 (BIT) IMPLANT
BIT DRILL RINGLOC 3.2X20 (BIT) ×1
BIT DRILL RINGLOC 3.2X40 (BIT) ×1
BIT DRILL RINGLOC QUICK CONN (BIT) IMPLANT
BLADE SHORT EXPLANT 50MM (BLADE) ×2 IMPLANT
BONE CHIP PRESERV 30CC PCAN30 (Bone Implant) ×4 IMPLANT
CABLE CERLAGE W/CRIMP 1.8 (Cable) ×2 IMPLANT
CABLE GTR COCR 1.8X635 (Orthopedic Implant) IMPLANT
CHIP CORTICOCANCELLOUS 30CC (Bone Implant) ×2 IMPLANT
CHIPS CORTICOCANCELLOUS 30CC (Bone Implant) ×1 IMPLANT
CHLORAPREP W/TINT 26 (MISCELLANEOUS) ×2 IMPLANT
COVER PERINEAL POST (MISCELLANEOUS) ×2 IMPLANT
COVER SURGICAL LIGHT HANDLE (MISCELLANEOUS) ×2 IMPLANT
CUBES CANC 30CC PCANCUBE30 (Bone Implant) ×2 IMPLANT
DECANTER SPIKE VIAL GLASS SM (MISCELLANEOUS) ×2 IMPLANT
DERMABOND ADVANCED (GAUZE/BANDAGES/DRESSINGS) ×1
DERMABOND ADVANCED .7 DNX12 (GAUZE/BANDAGES/DRESSINGS) ×2 IMPLANT
DRAPE IMP U-DRAPE 54X76 (DRAPES) ×2 IMPLANT
DRAPE SHEET LG 3/4 BI-LAMINATE (DRAPES) ×6 IMPLANT
DRAPE STERI IOBAN 125X83 (DRAPES) ×2 IMPLANT
DRAPE U-SHAPE 47X51 STRL (DRAPES) ×4 IMPLANT
DRILL BIT RINGLOC 3.2MMX20 (BIT) ×2
DRILL BIT RINGLOC 3.2MMX40 (BIT) ×2
DRILL BIT RINGLOC QUICK CONN (BIT) ×2
DRSG AQUACEL AG ADV 3.5X 6 (GAUZE/BANDAGES/DRESSINGS) ×1 IMPLANT
DRSG AQUACEL AG ADV 3.5X10 (GAUZE/BANDAGES/DRESSINGS) ×2 IMPLANT
ELECT REM PT RETURN 15FT ADLT (MISCELLANEOUS) ×2 IMPLANT
GAUZE SPONGE 4X4 12PLY STRL (GAUZE/BANDAGES/DRESSINGS) ×2 IMPLANT
GLOVE SRG 8 PF TXTR STRL LF DI (GLOVE) ×1 IMPLANT
GLOVE SURG ENC MOIS LTX SZ8.5 (GLOVE) ×4 IMPLANT
GLOVE SURG ENC TEXT LTX SZ7.5 (GLOVE) ×4 IMPLANT
GLOVE SURG UNDER POLY LF SZ8 (GLOVE) ×2
GLOVE SURG UNDER POLY LF SZ8.5 (GLOVE) ×2 IMPLANT
GOWN SPEC L3 XXLG W/TWL (GOWN DISPOSABLE) ×2 IMPLANT
GOWN STRL REUS W/TWL XL LVL3 (GOWN DISPOSABLE) ×2 IMPLANT
GRAFT BNE CANC CHIPS 1-8 30CC (Bone Implant) IMPLANT
GRAFT BNE CANC CUBE 1X1X1 30CC (Bone Implant) IMPLANT
GRAFT BNE CORT CANC CHIPS 30CC (Bone Implant) IMPLANT
HANDPIECE INTERPULSE COAX TIP (DISPOSABLE) ×2
HEAD CERAMIC BIOLOX 36 T1 +3 (Head) ×1 IMPLANT
HEAD FEM MOD CER 28X0 (Head) ×1 IMPLANT
HOLDER FOLEY CATH W/STRAP (MISCELLANEOUS) ×2 IMPLANT
HOOD PEEL AWAY FLYTE STAYCOOL (MISCELLANEOUS) ×8 IMPLANT
JET LAVAGE IRRISEPT WOUND (IRRIGATION / IRRIGATOR) ×2
KIT TURNOVER KIT A (KITS) IMPLANT
LAVAGE JET IRRISEPT WOUND (IRRIGATION / IRRIGATOR) ×1 IMPLANT
LINER ACETAB OFF G7 5 36 +5 (Liner) ×1 IMPLANT
MANIFOLD NEPTUNE II (INSTRUMENTS) ×2 IMPLANT
MARKER SKIN DUAL TIP RULER LAB (MISCELLANEOUS) ×2 IMPLANT
NDL SAFETY ECLIPSE 18X1.5 (NEEDLE) ×1 IMPLANT
NDL SPNL 18GX3.5 QUINCKE PK (NEEDLE) ×1 IMPLANT
NEEDLE HYPO 18GX1.5 SHARP (NEEDLE) ×2
NEEDLE SPNL 18GX3.5 QUINCKE PK (NEEDLE) ×2 IMPLANT
PACK ANTERIOR HIP CUSTOM (KITS) ×2 IMPLANT
PENCIL SMOKE EVACUATOR (MISCELLANEOUS) IMPLANT
PUTTY DBM STAGRAFT PLUS 10CC (Putty) ×1 IMPLANT
SAW OSC TIP CART 19.5X105X1.3 (SAW) ×2 IMPLANT
SCREW BONE 6.5X25 (Screw) ×1 IMPLANT
SCREW BONE 6.5X35 SELF TAP (Screw) ×3 IMPLANT
SEALER BIPOLAR AQUA 6.0 (INSTRUMENTS) ×2 IMPLANT
SET HNDPC FAN SPRY TIP SCT (DISPOSABLE) ×1 IMPLANT
SHELL ACETAB MULTIHOLE 58 (Hips) IMPLANT
SPONGE T-LAP 18X18 ~~LOC~~+RFID (SPONGE) ×6 IMPLANT
STAPLER INSORB 30 2030 C-SECTI (MISCELLANEOUS) IMPLANT
STAPLER VISISTAT 35W (STAPLE) ×1 IMPLANT
STEM FEM ARCOS MOD 14X175 (Stem) ×1 IMPLANT
SUT MNCRL AB 3-0 PS2 18 (SUTURE) ×2 IMPLANT
SUT MON AB 2-0 CT1 36 (SUTURE) ×5 IMPLANT
SUT STRATAFIX PDO 1 14 VIOLET (SUTURE) ×2
SUT STRATFX PDO 1 14 VIOLET (SUTURE) ×1
SUT VIC AB 2-0 CT1 27 (SUTURE)
SUT VIC AB 2-0 CT1 TAPERPNT 27 (SUTURE) IMPLANT
SUTURE STRATFX PDO 1 14 VIOLET (SUTURE) ×1 IMPLANT
SYR 3ML LL SCALE MARK (SYRINGE) ×2 IMPLANT
TRAY FOLEY MTR SLVR 16FR STAT (SET/KITS/TRAYS/PACK) IMPLANT
TUBE SUCTION HIGH CAP CLEAR NV (SUCTIONS) ×2 IMPLANT
WATER STERILE IRR 1000ML POUR (IV SOLUTION) ×2 IMPLANT

## 2021-12-08 NOTE — Progress Notes (Signed)
Received report from the floor nurse for surgery today.

## 2021-12-08 NOTE — Plan of Care (Signed)
°  Problem: Education: Goal: Verbalization of understanding the information provided (i.e., activity precautions, restrictions, etc) will improve Outcome: Progressing   Problem: Activity: Goal: Ability to ambulate and perform ADLs will improve Outcome: Progressing   Problem: Pain Management: Goal: Pain level will decrease Outcome: Progressing

## 2021-12-08 NOTE — Anesthesia Procedure Notes (Signed)
Date/Time: 12/08/2021 4:55 PM Performed by: Cynda Familia, CRNA Oxygen Delivery Method: Simple face mask Dental Injury: Teeth and Oropharynx as per pre-operative assessment

## 2021-12-08 NOTE — Interval H&P Note (Signed)
History and Physical Interval Note:  12/08/2021 2:39 PM  Isabel [name]  has presented today for surgery, with the diagnosis of Periprosthetic Fracture Left Hip.  The various methods of treatment have been discussed with the patient and family. After consideration of risks, benefits and other options for treatment, the patient has consented to  Procedure(s): Ivor (Left) as a surgical intervention.  The patient's history has been reviewed, patient examined, no change in status, stable for surgery.  I have reviewed the patient's chart and labs.  Questions were answered to the patient's satisfaction.     Hilton Cork Romone Shaff

## 2021-12-08 NOTE — Anesthesia Procedure Notes (Signed)
Spinal  Patient location during procedure: OR Start time: 12/08/2021 4:00 PM End time: 12/08/2021 4:03 PM Staffing Performed: anesthesiologist  Anesthesiologist: Darral Dash, DO Preanesthetic Checklist Completed: patient identified, IV checked, site marked, risks and benefits discussed, surgical consent, monitors and equipment checked, pre-op evaluation and timeout performed Spinal Block Patient position: right lateral decubitus Prep: DuraPrep Patient monitoring: heart rate, cardiac monitor, continuous pulse ox and blood pressure Approach: midline Location: L3-4 Injection technique: single-shot Needle Needle type: Pencan  Needle gauge: 24 G Needle length: 10 cm Assessment Events: CSF return Additional Notes Patient identified. Risks/Benefits/Options discussed with patient including but not limited to bleeding, infection, nerve damage, paralysis, failed block, incomplete pain control, headache, blood pressure changes, nausea, vomiting, reactions to medications, itching and postpartum back pain. Confirmed with bedside nurse the patient's most recent platelet count. Confirmed with patient that they are not currently taking any anticoagulation, have any bleeding history or any family history of bleeding disorders. Patient expressed understanding and wished to proceed. All questions were answered. Sterile technique was used throughout the entire procedure. Please see nursing notes for vital signs. Warning signs of high block given to the patient including shortness of breath, tingling/numbness in hands, complete motor block, or any concerning symptoms with instructions to call for help. Patient was given instructions on fall risk and not to get out of bed. All questions and concerns addressed with instructions to call with any issues or inadequate analgesia.

## 2021-12-08 NOTE — Transfer of Care (Signed)
Immediate Anesthesia Transfer of Care Note  Patient: Isabel [name]  Procedure(s) Performed: REVISION OF HIP RESURFACEING TO TOTAL HIP ARTHROPLASTY ANTERIOR APPROACH (Left: Hip)  Patient Location: PACU  Anesthesia Type:Spinal  Level of Consciousness: awake  Airway & Oxygen Therapy: Patient Spontanous Breathing and Patient connected to face mask oxygen  Post-op Assessment: Report given to RN and Post -op Vital signs reviewed and stable  Post vital signs: Reviewed and stable  Last Vitals:  Vitals Value Taken Time  BP 92/79 12/08/21 2001  Temp    Pulse 87 12/08/21 2006  Resp 25 12/08/21 2006  SpO2 100 % 12/08/21 2006  Vitals shown include unvalidated device data.  Last Pain:  Vitals:   12/08/21 1319  TempSrc:   PainSc: 0-No pain      Patients Stated Pain Goal: 2 (08/65/78 4696)  Complications: No notable events documented.

## 2021-12-08 NOTE — Progress Notes (Signed)
PROGRESS NOTE    Citlally Captain Mcnerney  VCB:[id number] DOB: 12/26/1955 DOA: 12/02/2021 PCP: Caren Macadam, MD    Chief Complaint  Patient presents with   Hip Pain    Brief Narrative: Patient is a 66 year old female history of osteoarthritis with bilateral hip resurfacing and possible metal reaction, history of breast cancer presenting to the ED when walking felt a pop in the left hip with immediate pain and inability to bear weight.  Patient denies any fall.  No syncopal episode.  No chest pain.  Patient seen in the ED plain films and consistent with a left femoral neck fracture.  Patient admitted.  Orthopedics consulted for further evaluation.   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 -Plain films consistent with left femoral neck fracture. 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  Commuted left femoral neck fracture with a fracture cleft extending along the medial aspect into the proximal diaphysis. -Preop EKG with normal sinus rhythm. -Patient with no chest pain.  No history of coronary artery disease. -Patient did state had been having some intermittent palpitations and was scheduled for outpatient 2D echo. -2D echo with a EF of 60-65%, NWMA.  -Patient seen in consultation by  orthopedics who planning THA revision today, Wednesday 12/08/2021, per Dr. Lyla Glassing. -It is noted by orthopedics note that they called [facility] at patient's request and spoke to Daisey Must who was unable to complete surgery in a more timely fashion as no orthopedics beds available. -Per orthopedics.  2.  Fall ruled out.  3.  Hyponatremia -Likely secondary to hypovolemic hyponatremia as urine sodium noted to be < 10.  Urinalysis with concerns for possible UTI.   -Sodium levels improved with hydration. -Saline lock IV fluids.  -Repeat labs in AM.   4.  Fever/probable UTI -Patient with some complaints of suprapubic abdominal discomfort. -Urinalysis done with large leukocytes, nitrite negative, 6-10 WBCs. -Urine cultures with no growth to date.  -Patient noted to have a fever of 101.4 (12/05/2021). -Blood cultures with no growth to date x3 days. -Chest x-ray with no acute infiltrate.  -Status post 3 days IV Rocephin.   -No further antibiotics needed.   -Incentive spirometry, flutter valve.    5.  Hypokalemia -Repleted - potassium at 3.8.  Magnesium at 2.2.    DVT prophylaxis: SCDs preoperative Code Status: Full Family Communication: Updated patient.  No family at bedside. Disposition:   Status is: Inpatient  Remains inpatient appropriate because: Severity of illness.       Consultants:  Orthopedics: Hilbert Odor, PA 12/03/2021  Procedures:  CT left hip 12/03/2021 Plain films of the left knee 12/03/2021 Chest x-ray 12/02/2021 Plain films of the left hip and pelvis 12/02/2021. 2D echo 12/04/2021   Antimicrobials:  IV Rocephin 12/05/2021>>> 12/07/2021   Subjective: Patient laying in bed.  No  chest pain.  No shortness of breath.  No abdominal pain.  Patient seems upset and somewhat frustrated as she states has not spoken to the surgeon since admission and they states she supposed to have surgery this afternoon.  Patient stating she would like to know what kind of procedure and  what kind of hardware is being placed prior to surgery.     Objective: Vitals:   12/07/21 0554 12/07/21 1339 12/07/21 2049 12/08/21 0523  BP: 122/68 119/69 124/68 129/73  Pulse: 72 73 74 72  Resp: 18 16 17 17   Temp: 98.8 F (37.1 C) 98.5 F (36.9 C) 98.7 F (37.1 C) 98.4 F (36.9 C)  TempSrc: Oral Oral Oral Oral  SpO2: 97% 98% 98% 97%  Weight:      Height:        Intake/Output Summary (Last 24 hours) at 12/08/2021 1104 Last data filed at 12/08/2021 0600 Gross per 24 hour  Intake 1210 ml  Output 3900 ml  Net -2690 ml    Filed Weights   12/03/21 0500 12/06/21 0626 12/07/21 0500  Weight: 65.1 kg 65.5 kg 65.3 kg    Examination:  General exam: NAD. Respiratory system: CTA B anterior lung fields.  No wheezes, no crackles, no rhonchi.  Normal respiratory effort.  Speaking in full sentences.  Cardiovascular system: RRR no murmurs rubs or gallops.  No JVD.  No lower extremity edema. Gastrointestinal system: Abdomen is soft, nontender, nondistended, positive bowel sounds.  No rebound.  No guarding.  Central nervous system: Alert and oriented. No focal neurological deficits. Extremities: Left lower extremity shortened and externally rotated.  Nontender to palpation on the left hip area.  Skin: No rashes, lesions or ulcers Psychiatry: Judgement and insight appear normal. Mood & affect appropriate.     Data Reviewed: I have personally reviewed following labs and imaging studies  CBC: Recent Labs  Lab 12/02/21 2043 12/03/21 0101 12/06/21 0333 12/07/21 0315 12/08/21 0839  WBC 7.5 8.0 4.9 5.3 4.6  NEUTROABS 4.9 5.5 3.2  --  2.7  HGB 13.0 13.1 12.4 12.5 13.6  HCT 39.9 39.1 37.4 38.2 40.0  MCV 85.3 84.1 84.0 84.0 81.3  PLT 231 214 206 220 240     Basic Metabolic Panel: Recent Labs  Lab 12/03/21 0101 12/04/21 0343 12/05/21 0315 12/06/21 0333 12/07/21 0315 12/08/21 0839  NA 136 129* 131* 134* 133* 135  K 3.6 3.8 3.4* 3.9 4.3 3.8  CL 106 94* 98 104 101 101  CO2 25  27 27 26 27 25   GLUCOSE 104* 102* 101* 106* 103* 103*  BUN 11 14 9  7* 8 9  CREATININE 0.51 0.42* 0.45 0.41* 0.50 0.40*  CALCIUM 8.7* 9.2 8.9 8.5* 8.7* 9.2  MG 2.4 2.0  --  1.9  --  2.2     GFR: Estimated Creatinine Clearance: 66.5 mL/min (A) (by C-G formula based on SCr of 0.4 mg/dL (L)).  Liver Function Tests: Recent Labs  Lab 12/03/21 0101  AST 23  ALT 20  ALKPHOS 63  BILITOT 0.7  PROT 6.9  ALBUMIN 4.3     CBG: No results for input(s): GLUCAP in the last 168 hours.   Recent Results (from the past 240 hour(s))  Resp Panel by RT-PCR (Flu A&B, Covid) Nasopharyngeal Swab     Status: None   Collection Time: 12/02/21  9:36 PM   Specimen: Nasopharyngeal Swab; Nasopharyngeal(NP) swabs in vial transport medium  Result Value Ref Range Status   SARS Coronavirus 2 by RT PCR NEGATIVE  NEGATIVE Final    Comment: (NOTE) SARS-CoV-2 target nucleic acids are NOT DETECTED.  The SARS-CoV-2 RNA is generally detectable in upper respiratory specimens during the acute phase of infection. The lowest concentration of SARS-CoV-2 viral copies this assay can detect is 138 copies/mL. A negative result does not preclude SARS-Cov-2 infection and should not be used as the sole basis for treatment or other patient management decisions. A negative result may occur with  improper specimen collection/handling, submission of specimen other than nasopharyngeal swab, presence of viral mutation(s) within the areas targeted by this assay, and inadequate number of viral copies(<138 copies/mL). A negative result must be combined with clinical observations, patient history, and epidemiological information. The expected result is Negative.  Fact Sheet for Patients:  EntrepreneurPulse.com.au  Fact Sheet for Healthcare Providers:  IncredibleEmployment.be  This test is no t yet approved or cleared by the [place] FDA and  has been authorized for detection and/or  diagnosis of SARS-CoV-2 by FDA under an Emergency Use Authorization (EUA). This EUA will remain  in effect (meaning this test can be used) for the duration of the COVID-19 declaration under Section 564(b)(1) of the Act, 21 U.S.C.section 360bbb-3(b)(1), unless the authorization is terminated  or revoked sooner.       Influenza A by PCR NEGATIVE NEGATIVE Final   Influenza B by PCR NEGATIVE NEGATIVE Final    Comment: (NOTE) The Xpert Xpress SARS-CoV-2/FLU/RSV plus assay is intended as an aid in the diagnosis of influenza from Nasopharyngeal swab specimens and should not be used as a sole basis for treatment. Nasal washings and aspirates are unacceptable for Xpert Xpress SARS-CoV-2/FLU/RSV testing.  Fact Sheet for Patients: EntrepreneurPulse.com.au  Fact Sheet for Healthcare Providers: IncredibleEmployment.be  This test is not yet approved or cleared by the [place] FDA and has been authorized for detection and/or diagnosis of SARS-CoV-2 by FDA under an Emergency Use Authorization (EUA). This EUA will remain in effect (meaning this test can be used) for the duration of the COVID-19 declaration under Section 564(b)(1) of the Act, 21 U.S.C. section 360bbb-3(b)(1), unless the authorization is terminated or revoked.  Performed at [facility], [place] [address], [place], [place] [address]   Urine Culture     Status: None   Collection Time: 12/05/21  9:00 AM   Specimen: Urine, Clean Catch  Result Value Ref Range Status   Specimen Description   Final    URINE, CLEAN CATCH Performed at [facility], [place] [address], [place], [place] [address]    Special Requests   Final    NONE Performed at [facility], [place] [address] [place], [place] [address]    Culture   Final    NO GROWTH Performed at [facility], [place] [address], [place], [place] [address]    Report Status 12/06/2021 FINAL   Final  Culture, blood (Routine X 2) w Reflex to ID Panel     Status: None (Preliminary result)   Collection Time: 12/05/21  4:43 PM   Specimen: BLOOD  Result Value Ref Range Status   Specimen Description   Final    BLOOD RIGHT ANTECUBITAL Performed at [place] [address], [place], [place] [address]    Special Requests   Final    BOTTLES DRAWN AEROBIC ONLY Blood Culture adequate volume Performed at [place] [address] [place], [place] [address]    Culture   Final    NO GROWTH 3 DAYS  Performed at [facility], [place] [address] [place], [place] [address]    Report Status PENDING  Incomplete  Culture, blood (Routine X 2) w Reflex to ID Panel     Status: None (Preliminary result)   Collection Time: 12/05/21  4:50 PM   Specimen: BLOOD  Result Value Ref Range Status   Specimen Description   Final    BLOOD RIGHT ANTECUBITAL Performed at [place] [address], [place], [place] [address]    Special Requests   Final    BOTTLES DRAWN AEROBIC ONLY Blood Culture adequate volume Performed at [place] [address], [place], [place] [address]    Culture   Final    NO GROWTH 3 DAYS Performed at [facility], [place] [address] [place], [place] [address]    Report Status PENDING  Incomplete  Surgical PCR screen     Status: None   Collection Time: 12/07/21  7:32 PM   Specimen: Nasal Mucosa; Nasal Swab  Result Value Ref Range Status   MRSA, PCR NEGATIVE NEGATIVE Final   Staphylococcus aureus NEGATIVE NEGATIVE Final    Comment: (NOTE) The Xpert SA Assay (FDA approved for NASAL specimens in patients 59 years of age and older), is one component of a comprehensive surveillance program. It is not intended to diagnose infection nor to guide or monitor treatment. Performed at [facility], [place] [address] [place], [place] [address]           Radiology  Studies: No results found.      Scheduled Meds:  chlorhexidine  60 mL Topical Once   povidone-iodine  2 application Topical Once   povidone-iodine  2 application Topical Once   senna-docusate  1 tablet Oral BID   Continuous Infusions:  sodium chloride 10 mL/hr at 12/06/21 0948    ceFAZolin (ANCEF) IV     tranexamic acid     vancomycin       LOS: 6 days    Time spent: 35 minutes    Irine Seal, MD [facility]   To contact the attending provider between 7A-7P or the covering provider during after hours 7P-7A, please log into the web site [email or web address] and access using universal [place] password for that web site. If you do not have the password, please call the hospital operator.  12/08/2021, 11:04 AM

## 2021-12-08 NOTE — Anesthesia Preprocedure Evaluation (Signed)
Anesthesia Evaluation  Patient identified by MRN, date of birth, ID band Patient awake    Reviewed: Allergy & Precautions, NPO status , Patient's Chart, lab work & pertinent test results  Airway Mallampati: II  TM Distance: >3 FB Neck ROM: Full    Dental no notable dental hx.    Pulmonary neg pulmonary ROS,    Pulmonary exam normal        Cardiovascular negative cardio ROS   Rhythm:Regular Rate:Normal     Neuro/Psych negative neurological ROS  negative psych ROS   GI/Hepatic negative GI ROS, Neg liver ROS,   Endo/Other  negative endocrine ROS  Renal/GU negative Renal ROS   fibroids    Musculoskeletal  (+) Arthritis , Osteoarthritis,  Left hip fx   Abdominal Normal abdominal exam  (+)   Peds  Hematology negative hematology ROS (+)   Anesthesia Other Findings   Reproductive/Obstetrics                            Anesthesia Physical Anesthesia Plan  ASA: 2  Anesthesia Plan: MAC and Spinal   Post-op Pain Management:    Induction:   PONV Risk Score and Plan: 2 and Propofol infusion, Ondansetron, Dexamethasone and Treatment may vary due to age or medical condition  Airway Management Planned: Simple Face Mask, Natural Airway and Nasal Cannula  Additional Equipment: None  Intra-op Plan:   Post-operative Plan:   Informed Consent: I have reviewed the patients History and Physical, chart, labs and discussed the procedure including the risks, benefits and alternatives for the proposed anesthesia with the patient or authorized representative who has indicated his/her understanding and acceptance.     Dental advisory given  Plan Discussed with: CRNA  Anesthesia Plan Comments: (Lab Results      Component                Value               Date                      WBC                      4.6                 12/08/2021                HGB                      13.6                 12/08/2021                HCT                      40.0                12/08/2021                MCV                      81.3                12/08/2021                PLT  240                 12/08/2021          )        Anesthesia Quick Evaluation

## 2021-12-08 NOTE — Anesthesia Postprocedure Evaluation (Signed)
Anesthesia Post Note  Patient: Isabel [name]  Procedure(s) Performed: REVISION OF HIP RESURFACEING TO TOTAL HIP ARTHROPLASTY ANTERIOR APPROACH (Left: Hip)     Patient location during evaluation: Nursing Unit Anesthesia Type: MAC Level of consciousness: oriented and awake and alert Pain management: pain level controlled Vital Signs Assessment: post-procedure vital signs reviewed and stable Respiratory status: spontaneous breathing and respiratory function stable Cardiovascular status: blood pressure returned to baseline and stable Postop Assessment: no headache, no backache, no apparent nausea or vomiting and patient able to bend at knees Anesthetic complications: no   No notable events documented.  Last Vitals:  Vitals:   12/08/21 2015 12/08/21 2030  BP: 135/78 (!) 95/58  Pulse: 88 90  Resp: (!) 24 14  Temp:    SpO2: 100% 100%    Last Pain:  Vitals:   12/08/21 2035  TempSrc:   PainSc: 5                  Barnet [place]

## 2021-12-08 NOTE — Op Note (Signed)
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  POSTOPERATIVE DIAGNOSIS:  Same.  PROCEDURE: Revision left hip resurfacing, conversion to total hip arthroplasty.  ANESTHESIA:   Spinal plus MAC.  ANTIBIOTICS: 2 g Ancef. 1 g vancomycin.  EXPLANTS: [employer] medical conserve plus size 50 mm acetabular shell with size 62 femoral component utilizing 44 mm metal ball.  IMPLANTS: Biomet Arcos 1 piece revision system, high offset broach body, size 14 x 175 mm, type I taper. Biomet G7 Osseo Ti multihole acetabular shell, size 58 mm, G. 6.5 mm cancellous bone screw x4. Vivacit-E polyethylene liner, size G, 36+5 mm neutral. Zimmer 1.8 mm adult reconstruction cable x2.  SPECIMENS: None.  COMPLICATIONS: None.  DISPOSITION: Stable to PACU.  SURGICAL INDICATIONS:  Isabel [name] is a 66 y.o. female who underwent staged bilateral hip resurfacing at [facility] over 10 years ago.  On the right side, she has a [place] hip resurfacing.  On the left side, she has [employer]. She states that the right side is fine.  The left side has had intermittent pain on and off for quite some time.  She has had a long standing popping sensation in the left hip with certain positions.  About 2 weeks ago, she had sudden increasing left hip pain, particularly with weightbearing.  She became dependent on an assist device.  She states that she was trying to find an orthopedic surgeon to evaluate her left hip.  Ultimately, she sustained a periprosthetic left hip fracture while weightbearing.  She had left hip pain and inability to weight-bear.  She was brought to the emergency department at [facility], where x-rays and CT scan revealed comminuted basicervical periprosthetic  femoral neck fracture with extension into the subtrochanteric region of the femur.  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  The risks, benefits, and alternatives were discussed with the patient. There are risks associated with the surgery including, but not limited to, problems with anesthesia (death), infection, instability (giving out of the joint), dislocation, differences in leg length/angulation/rotation, fracture of bones, loosening or failure of implants, hematoma (blood accumulation) which may require surgical drainage, blood clots, pulmonary embolism, nerve injury (foot drop and lateral thigh numbness), and blood vessel injury. The patient understands these risks and elects to proceed.  PROCEDURE IN DETAIL: The patient was identified in the holding area using 2 identifiers.  The surgical site was marked by myself.  She was taken to the operating room, and spinal anesthesia was induced in the right lateral decubitus position.  A Foley catheter was placed.  She was then transferred to the [facility] table.  All bony prominences were well-padded.  She did receive IV antibiotics within 60 minutes of beginning the procedure.  Timeout was called, verifying site and site of surgery.  Bikini incision was created 3 fingerbreadths distal to the ASIS.  Superficial dissection was carried out lateral to the ASIS in order to protect the lateral femoral cutaneous nerve.  Direct anterior approach was performed to the hip using the Heuter interval. Lateral femoral circumflex vessels were treated with the Auqumantys. The anterior capsule was exposed and an inverted T capsulotomy was made.  Upon entering the  joint, there was a large hematoma.  There was no purulence or evidence of infection.  There was significant metal staining of the periarticular tissues.  I identified the basicervical femoral  neck fracture.  Using a saw, I freshened the femoral neck cut.  I was then able to remove the femoral neck and femoral component of the resurfacing.  There was significant osteolysis behind the femoral component with profound metallosis.  No bone mass was identified.  The femoral component and femoral neck remnant was passed to the back table.  Acetabular retractors were then placed.  There is a significant amount of synovial tissue with heavy metallosis staining.  All of this tissue was debrided.  I identified the acetabular component, which was well fixed.  There was some excess anteversion of the acetabular component.  Knowing that there was profound osteolysis seen on the CT scan, I elected to retain the acetabular component at this point and see what our ultimate stability would be.  Proximal femur was exposed using standard external rotation, extension, and adduction.  The short external rotators and piriformis were transected from the previous operation.  I was able to obtain excellent proximal femoral exposure without any significant releasing required.  At this point, I reamed up sequentially to a 14 mm reamer, planning for a 175 mm long stem.  With the reamer in place, I brought in fluoroscopy to confirm adequate sizing and positioning of the reamer.  I then broached up to a size 14 broach.  At this point, I placed a prophylactic subperiosteal cable just distal to the lesser trochanter.  I also placed an additional subperiosteal cable to compress the fracture just proximal to the lesser trochanter.  The cables were tensioned, the screw was tightened, and the cables were clipped.  I then placed the real 14 x 175 mm stem.  Trial dual mobility construct was assembled and placed onto the trunnion.  The hip was then reduced.  Leg lengths were checked with fluoroscopy.  Soft tissue tension was excellent.  The hip was then dislocated, and I removed the trial dual mobility construct.  The real dual mobility  construct was assembled on the back table with a 28 standard ceramic head and a 44 mm outer ball.  The dual mobility bearing was impacted onto the trunnion and the hip was reduced.  I then repeated stability testing.  In external rotation past 60 degrees, there was significant impingement of the neck onto the overly anteverted acetabular component.  It was obvious that an acetabular component revision would be required in order to achieve adequate stability.  At this point, the hip was again dislocated gently using a bone hook.  Using a bone tamp, I removed the dual mobility bearing.  Using a stem extractor, I removed the femoral stem.  I then regained acetabular exposure.  All soft tissue was debrided from the interface of the bone and acetabular component.  Using the Zimmer explant system, I was able to remove the acetabular component without any significant bone loss.  I then inspected the acetabulum.  There was significant osteolysis involving the anterior and posterior columns.  The anterior and posterior walls were deficient.  There was a cavitary defect in the medial wall that was contained.  I reamed up sequentially to a 57 mm reamer until I had engagement of the reamer in the anterior and posterior walls.  Care was taken not to raise the joint line.  I then opened cancellous bone chips and DBM  bone putty.  The mixture of DBM putty and bone chips was placed into the bony defects.  The bone graft was impacted using a 56 reamer on reverse.  The real 58 mm OsseoTi multihole revision cup was opened and impacted at roughly 45 degrees of abduction and 20 degrees of anteversion.  The cup was provisionally stable, however screw fixation was required.  I placed a total of 3 35 mm screws in the superior dome.  I placed a 25 mm screw in the inferior aspect of the cup in the posterior column.  Fixation of the cup at this point was excellent.  I then opened a 36+5 mm liner and impacted it into the cup.  Proximal  femoral exposure was again achieved.  The real 14 x 175 mm Arcos 1 piece stem was reinserted into the femur, and excellent fixation was achieved.  Trialing was then performed, and I determined that a 36+3 mm head was required to achieve stability.  The real 36+3 mm ceramic head ball was impacted onto the trunnion, and the hip was reduced.  Stability testing was then repeated.  At 90 degrees of flexion and neutral abduction, the hip was stable to internal rotation of 90 degrees.  Soft tissue tension was adequate.  At 90 degrees of external rotation, I was able to extend the leg fully to the ground without any impingement or instability.  Final fluoroscopy views were used to confirm component positioning and leg lengths.  The leg was lengthened roughly 4 mm, which again was required in order to achieve adequate stability.  The wound was copiously irrigated with Irrisept solution and normal saline using pule lavage.  Marcaine solution was injected into the periarticular soft tissue.  The wound was closed in layers using #1 Stratafix for the fascia, 2-0 Vicryl for the subcutaneous fat, 2-0 Monocryl for the deep dermal layer, and staples plus Dermabond for the skin.  Once the glue was fully dried, an Aquacell Ag dressing was applied.  The patient was transported to the recovery room in stable condition.  Sponge, needle, and instrument counts were correct at the end of the case x2.  The patient tolerated the procedure well and there were no known complications.  Please note that a surgical assistant was a medical necessity for this procedure to perform it in a safe and expeditious manner. Assistant was necessary to provide appropriate retraction of vital neurovascular structures, to prevent femoral fracture, and to allow for anatomic placement of the prosthesis.  POSTOPERATIVE PLAN: Postoperatively, the patient will be readmitted to the hospitalist service.  Touchdown weightbearing left lower extremity with a walker  for 12 weeks.  Posterior hip precautions.  Begin aspirin 81 mg p.o. twice daily for DVT prophylaxis for 6 weeks.  Mobilize out of bed with PT/OT.  She will undergo disposition planning.  Return to the office in 2 weeks for routine postoperative care.

## 2021-12-09 DIAGNOSIS — D62 Acute posthemorrhagic anemia: Secondary | ICD-10-CM | POA: Diagnosis not present

## 2021-12-09 LAB — CBC
HCT: 21.7 % — ABNORMAL LOW (ref 36.0–46.0)
Hemoglobin: 7.5 g/dL — ABNORMAL LOW (ref 12.0–15.0)
MCH: 28.5 pg (ref 26.0–34.0)
MCHC: 34.6 g/dL (ref 30.0–36.0)
MCV: 82.5 fL (ref 80.0–100.0)
Platelets: 171 10*3/uL (ref 150–400)
RBC: 2.63 MIL/uL — ABNORMAL LOW (ref 3.87–5.11)
RDW: 13.2 % (ref 11.5–15.5)
WBC: 8.2 10*3/uL (ref 4.0–10.5)
nRBC: 0 % (ref 0.0–0.2)

## 2021-12-09 LAB — BASIC METABOLIC PANEL
Anion gap: 8 (ref 5–15)
BUN: 12 mg/dL (ref 8–23)
CO2: 23 mmol/L (ref 22–32)
Calcium: 8.2 mg/dL — ABNORMAL LOW (ref 8.9–10.3)
Chloride: 102 mmol/L (ref 98–111)
Creatinine, Ser: 0.58 mg/dL (ref 0.44–1.00)
GFR, Estimated: >60 mL/min (ref >60)
Glucose, Bld: 215 mg/dL — ABNORMAL HIGH (ref 70–99)
Potassium: 3.6 mmol/L (ref 3.5–5.1)
Sodium: 133 mmol/L — ABNORMAL LOW (ref 135–145)

## 2021-12-09 LAB — FERRITIN: Ferritin: 89 ng/mL (ref 11–307)

## 2021-12-09 LAB — PREPARE RBC (CROSSMATCH)

## 2021-12-09 LAB — VITAMIN B12: Vitamin B-12: 779 pg/mL (ref 180–914)

## 2021-12-09 LAB — IRON AND TIBC
Iron: 13 ug/dL — ABNORMAL LOW (ref 28–170)
Saturation Ratios: 7 % — ABNORMAL LOW (ref 10.4–31.8)
TIBC: 188 ug/dL — ABNORMAL LOW (ref 250–450)
UIBC: 175 ug/dL

## 2021-12-09 LAB — FOLATE: Folate: 22.5 ng/mL (ref >5.9)

## 2021-12-09 MED ORDER — DIPHENHYDRAMINE HCL 25 MG PO CAPS
25.0000 mg | ORAL_CAPSULE | Freq: Once | ORAL | Status: AC
  Administered 2021-12-09: 25 mg via ORAL
  Filled 2021-12-09: qty 1

## 2021-12-09 MED ORDER — ACETAMINOPHEN 325 MG PO TABS
650.0000 mg | ORAL_TABLET | Freq: Once | ORAL | Status: AC
  Administered 2021-12-09: 650 mg via ORAL
  Filled 2021-12-09: qty 2

## 2021-12-09 MED ORDER — SENNA 8.6 MG PO TABS
1.0000 | ORAL_TABLET | Freq: Every day | ORAL | Status: DC | PRN
  Filled 2021-12-09: qty 1

## 2021-12-09 MED ORDER — SODIUM CHLORIDE 0.9% IV SOLUTION: Freq: Once | INTRAVENOUS | Status: DC

## 2021-12-09 MED ORDER — OXYCODONE HCL 5 MG PO TABS
5.0000 mg | ORAL_TABLET | ORAL | 0 refills | Status: DC | PRN
Start: 2021-12-09 — End: 2021-12-14

## 2021-12-09 MED ORDER — ASPIRIN 81 MG PO CHEW: 81.0000 mg | CHEWABLE_TABLET | Freq: Two times a day (BID) | ORAL | 0 refills | Status: AC

## 2021-12-09 NOTE — Progress Notes (Signed)
° ° °  Subjective:  Patient reports pain as mild to moderate.  Denies N/V/CP/SOB.   Objective:   VITALS:   Vitals:   12/09/21 0850 12/09/21 0855 12/09/21 1017 12/09/21 1023  BP: (!) 55/44 (!) 87/62 (!) 99/54   Pulse:   (!) 105 96  Resp:   16   Temp:   98.6 F (37 C)   TempSrc:      SpO2:   97%   Weight:      Height:        NAD ABD soft Neurovascular intact Sensation intact distally Intact pulses distally Dorsiflexion/Plantar flexion intact Incision: dressing C/D/I   Lab Results  Component Value Date   WBC 8.2 12/09/2021   HGB 7.5 (L) 12/09/2021   HCT 21.7 (L) 12/09/2021   MCV 82.5 12/09/2021   PLT 171 12/09/2021   BMET    Component Value Date/Time   NA 133 (L) 12/09/2021 0328   NA 140 05/29/2014 0950   K 3.6 12/09/2021 0328   K 4.3 05/29/2014 0950   CL 102 12/09/2021 0328   CL 105 03/01/2013 1020   CO2 23 12/09/2021 0328   CO2 27 05/29/2014 0950   GLUCOSE 215 (H) 12/09/2021 0328   GLUCOSE 97 05/29/2014 0950   GLUCOSE 101 (H) 03/01/2013 1020   BUN 12 12/09/2021 0328   BUN 15.3 05/29/2014 0950   CREATININE 0.58 12/09/2021 0328   CREATININE 0.60 07/17/2020 0923   CREATININE 0.8 05/29/2014 0950   CALCIUM 8.2 (L) 12/09/2021 0328   CALCIUM 9.1 05/29/2014 0950   GFRNONAA >60 12/09/2021 0328     Assessment/Plan: 1 Day Post-Op   Principal Problem:   Closed left hip fracture (HCC) Active Problems:   Left hip pain   Fall at home, initial encounter   Periprosthetic fracture around internal prosthetic hip joint   Fever   Acute lower UTI   TDWB RLE with walker DVT ppx: Aspirin, SCDs, TEDS PO pain control PT/OT ABLA: HgB 7.5 this AM. Hospitalist planning on transfusing 2 units Dispo: Pending. Patient considering SNF vs home with hospital bed and HHPT.      Dorothyann Peng 12/09/2021, 10:52 AM  [facility] is now [employer] [address], [place], [place], [place] [address] Phone:  [phone] [email or web address] Facebook   [employer]

## 2021-12-09 NOTE — Evaluation (Signed)
Occupational Therapy Evaluation Patient Details Name: Isabel [name] MRN: [id number] DOB: 08/21/1956 Today's Date: 12/09/2021   History of Present Illness Pt s/p L THR revision from hip resurfacing.  Pt with hx of bil hip resurfacing and breast CA   Clinical Impression   [name] is a 66 year old woman s/p hip fracture repair with TDWB status and posterior hip precautions. She presents with decreased Rom and strength of LLE, decreased activity tolerance, impaired balance and pain resulting in a decline in independence. Patient min-mod assist for bed transfers. Today's evaluation limited by orthostatic blood pressures - BP sitting 98/54, sitting 44/44; after return to supine 87/52. Patient will benefit from skilled OT services while in hospital to improve deficits and learn compensatory strategies as needed in order to return to PLOF. Patient is hopeful to return home at discharge but barriers to home include second story and limited physical assistance. Will update POC as needed.     Recommendations for follow up therapy are one component of a multi-disciplinary discharge planning process, led by the attending physician.  Recommendations may be updated based on patient status, additional functional criteria and insurance authorization.   Follow Up Recommendations  Home health OT    Assistance Recommended at Discharge Intermittent Supervision/Assistance  Patient can return home with the following A little help with bathing/dressing/bathroom;Assistance with cooking/housework;A lot of help with walking and/or transfers;Help with stairs or ramp for entrance    Functional Status Assessment  Patient has had a recent decline in their functional status and demonstrates the ability to make significant improvements in function in a reasonable and predictable amount of time.  Equipment Recommendations  BSC/3in1    Recommendations for Other Services       Precautions / Restrictions  Precautions Precautions: Posterior Hip Precaution Booklet Issued: Yes (comment) Restrictions Weight Bearing Restrictions: Yes LLE Weight Bearing: Touchdown weight bearing      Mobility Bed Mobility Overal bed mobility: Needs Assistance Bed Mobility: Supine to Sit;Sit to Supine;Rolling Rolling: Min assist   Supine to sit: Min assist;+2 for physical assistance;+2 for safety/equipment Sit to supine: Mod assist;+2 for physical assistance;+2 for safety/equipment   General bed mobility comments: use of rail, cues for sequence, use of R LE to self assist and adherence to THP    Transfers                   General transfer comment: NT 2* dizziness in sitting - pt orthostatic      Balance Overall balance assessment: Needs assistance Sitting-balance support: Single extremity supported;Feet supported Sitting balance-Leahy Scale: Fair                                     ADL either performed or assessed with clinical judgement   ADL Overall ADL's : Needs assistance/impaired Eating/Feeding: Independent   Grooming: Set up;Bed level   Upper Body Bathing: Set up;Bed level   Lower Body Bathing: Moderate assistance;Bed level   Upper Body Dressing : Set up;Bed level   Lower Body Dressing: Maximal assistance;Bed level       Toileting- Clothing Manipulation and Hygiene: Moderate assistance;Bed level               Vision Patient Visual Report: No change from baseline       Perception     Praxis      Pertinent Vitals/Pain Pain Assessment: 0-10 Faces Pain Scale: Hurts  a little bit Pain Location: L hip Pain Descriptors / Indicators: Aching Pain Intervention(s): Limited activity within patient's tolerance;Premedicated before session     Hand Dominance     Extremity/Trunk Assessment Upper Extremity Assessment Upper Extremity Assessment: Overall WFL for tasks assessed   Lower Extremity Assessment Lower Extremity Assessment: Defer to PT  evaluation LLE Deficits / Details: strength at hip 2/5 with AAROM at hip to 75 flex and 15 abd   Cervical / Trunk Assessment Cervical / Trunk Assessment: Normal   Communication Communication Communication: No difficulties   Cognition Arousal/Alertness: Awake/alert Behavior During Therapy: WFL for tasks assessed/performed Overall Cognitive Status: Within Functional Limits for tasks assessed                                       General Comments       Exercises Exercises: Total Joint Total Joint Exercises Ankle Circles/Pumps: AROM;Both;15 reps;Supine Quad Sets: AROM;Both;10 reps;Supine Heel Slides: AAROM;Left;20 reps;Supine Hip ABduction/ADduction: AAROM;Left;15 reps;Supine   Shoulder Instructions      Home Living Family/patient expects to be discharged to:: Private residence Living Arrangements: Spouse/significant other Available Help at Discharge: Family Type of Home: House Home Access: Stairs to enter Technical brewer of Steps: 1+1   Home Layout: Two level;Bed/bath upstairs Alternate Level Stairs-Number of Steps: flight Alternate Level Stairs-Rails: Right Bathroom Shower/Tub: Occupational psychologist: Standard     Home Equipment: Cane - single point;BSC/3in1   Additional Comments: has partial BSC. Needs second BSC for second floor.      Prior Functioning/Environment Prior Level of Function : Independent/Modified Independent             Mobility Comments: struggling for several weeks prior to admit ADLs Comments: modified independent        OT Problem List: Decreased strength;Decreased range of motion;Decreased activity tolerance;Impaired balance (sitting and/or standing);Pain;Decreased knowledge of use of DME or AE;Decreased knowledge of precautions      OT Treatment/Interventions: Self-care/ADL training;Therapeutic exercise;DME and/or AE instruction;Therapeutic activities;Balance training;Patient/family education    OT  Goals(Current goals can be found in the care plan section) Acute Rehab OT Goals Patient Stated Goal: to improve independence OT Goal Formulation: With patient Time For Goal Achievement: 12/23/21 Potential to Achieve Goals: Good  OT Frequency: Min 2X/week    Co-evaluation PT/OT/SLP Co-Evaluation/Treatment: Yes (co eval) Reason for Co-Treatment: For patient/therapist safety PT goals addressed during session: Mobility/safety with mobility OT goals addressed during session: ADL's and self-care      AM-PAC OT "6 Clicks" Daily Activity     Outcome Measure Help from another person eating meals?: None Help from another person taking care of personal grooming?: A Little Help from another person toileting, which includes using toliet, bedpan, or urinal?: A Lot Help from another person bathing (including washing, rinsing, drying)?: A Lot Help from another person to put on and taking off regular upper body clothing?: A Little Help from another person to put on and taking off regular lower body clothing?: A Lot 6 Click Score: 16   End of Session Nurse Communication: Mobility status  Activity Tolerance: Other (comment) (orthostatic) Patient left:    OT Visit Diagnosis: Other abnormalities of gait and mobility (R26.89);Muscle weakness (generalized) (M62.81);Pain                Time: 6045-4098 OT Time Calculation (min): 22 min Charges:  OT General Charges $OT Visit: 1 Visit OT Evaluation $OT  Eval Low Complexity: 1 Low  Torunn Chancellor, OTR/L [place]  Office [phone] Pager: [place] 12/09/2021, 1:11 PM

## 2021-12-09 NOTE — Progress Notes (Signed)
Patient in yellow mews Dr [place] Silos notified and on floor with orders received  Isabel [name]

## 2021-12-09 NOTE — Evaluation (Signed)
Physical Therapy Evaluation Patient Details Name: Isabel [name] MRN: [id number] DOB: 1956/03/16 Today's Date: 12/09/2021  History of Present Illness  Pt s/p L THR revision from hip resurfacing.  Pt with hx of bil hip resurfacing and breast CA  Clinical Impression  Pt admitted as above and presenting with functional mobility limitations 2* decreased L LE strength/ROM, post op pain, posterior THP and TDWB on L LE.  This date pt performed therex program and assist to sitting EOB but further progress limited by orthostatic hypotension - BP sitting 98/54, sitting 44/44; after return to supine 87/52 - RN aware.  Pt hopes to progress to dc home but limited by 2 story arrangement and limited assist and may benefit from follow up rehab at SNF level to maximize IND and safety.     Recommendations for follow up therapy are one component of a multi-disciplinary discharge planning process, led by the attending physician.  Recommendations may be updated based on patient status, additional functional criteria and insurance authorization.  Follow Up Recommendations Skilled nursing-short term rehab (<3 hours/day)    Assistance Recommended at Discharge Frequent or constant Supervision/Assistance  Patient can return home with the following       Equipment Recommendations Rolling walker (2 wheels);BSC/3in1  Recommendations for Other Services       Functional Status Assessment Patient has had a recent decline in their functional status and demonstrates the ability to make significant improvements in function in a reasonable and predictable amount of time.     Precautions / Restrictions Precautions Precautions: Posterior Hip Precaution Booklet Issued: Yes (comment) Restrictions Weight Bearing Restrictions: Yes LLE Weight Bearing: Touchdown weight bearing      Mobility  Bed Mobility Overal bed mobility: Needs Assistance Bed Mobility: Supine to Sit;Sit to Supine;Rolling Rolling: Min assist    Supine to sit: Min assist;+2 for physical assistance;+2 for safety/equipment Sit to supine: Mod assist;+2 for physical assistance;+2 for safety/equipment   General bed mobility comments: use of rail, cues for sequence, use of R LE to self assist and adherence to THP    Transfers                   General transfer comment: NT 2* dizziness in sitting - pt orthostatic    Ambulation/Gait                  Stairs            Wheelchair Mobility    Modified Rankin (Stroke Patients Only)       Balance Overall balance assessment: Needs assistance Sitting-balance support: Single extremity supported;Feet supported Sitting balance-Leahy Scale: Fair                                       Pertinent Vitals/Pain Pain Assessment: Faces Faces Pain Scale: Hurts a little bit Pain Descriptors / Indicators: Aching Pain Intervention(s): Limited activity within patient's tolerance;Monitored during session;Premedicated before session    Home Living Family/patient expects to be discharged to:: Private residence Living Arrangements: Spouse/significant other Available Help at Discharge: Family Type of Home: House Home Access: Stairs to enter   Technical brewer of Steps: 1+1 Alternate Level Stairs-Number of Steps: flight Home Layout: Two level;Bed/bath upstairs Home Equipment: Cane - single point      Prior Function Prior Level of Function : Independent/Modified Independent             Mobility Comments:  struggling for several weeks prior to admit       Hand Dominance        Extremity/Trunk Assessment   Upper Extremity Assessment Upper Extremity Assessment: Overall WFL for tasks assessed    Lower Extremity Assessment Lower Extremity Assessment: LLE deficits/detail LLE Deficits / Details: strength at hip 2/5 with AAROM at hip to 75 flex and 15 abd    Cervical / Trunk Assessment Cervical / Trunk Assessment: Normal  Communication    Communication: No difficulties  Cognition Arousal/Alertness: Awake/alert Behavior During Therapy: WFL for tasks assessed/performed                                            General Comments      Exercises Total Joint Exercises Ankle Circles/Pumps: AROM;Both;15 reps;Supine Quad Sets: AROM;Both;10 reps;Supine Heel Slides: AAROM;Left;20 reps;Supine Hip ABduction/ADduction: AAROM;Left;15 reps;Supine   Assessment/Plan    PT Assessment Patient needs continued PT services  PT Problem List Decreased strength;Decreased range of motion;Decreased activity tolerance;Decreased balance;Decreased mobility;Decreased knowledge of use of DME;Pain;Decreased knowledge of precautions       PT Treatment Interventions DME instruction;Gait training;Stair training;Functional mobility training;Therapeutic activities;Therapeutic exercise;Patient/family education;Balance training    PT Goals (Current goals can be found in the Care Plan section)  Acute Rehab PT Goals Patient Stated Goal: Regain IND PT Goal Formulation: With patient Time For Goal Achievement: 12/23/21 Potential to Achieve Goals: Good    Frequency 7X/week     Co-evaluation PT/OT/SLP Co-Evaluation/Treatment: Yes Reason for Co-Treatment: For patient/therapist safety PT goals addressed during session: Mobility/safety with mobility OT goals addressed during session: ADL's and self-care       AM-PAC PT "6 Clicks" Mobility  Outcome Measure Help needed turning from your back to your side while in a flat bed without using bedrails?: A Little Help needed moving from lying on your back to sitting on the side of a flat bed without using bedrails?: A Lot Help needed moving to and from a bed to a chair (including a wheelchair)?: A Lot Help needed standing up from a chair using your arms (e.g., wheelchair or bedside chair)?: A Lot Help needed to walk in hospital room?: Total Help needed climbing 3-5 steps with a railing?  : Total 6 Click Score: 11    End of Session Equipment Utilized During Treatment: Gait belt Activity Tolerance: Other (comment) (orthostatic) Patient left: in bed;with call bell/phone within reach;with bed alarm set   PT Visit Diagnosis: Difficulty in walking, not elsewhere classified (R26.2);Pain Pain - Right/Left: Left Pain - part of body: Hip    Time: 0810-0855 PT Time Calculation (min) (ACUTE ONLY): 45 min   Charges:   PT Evaluation $PT Eval Low Complexity: 1 Low PT Treatments $Therapeutic Exercise: 8-22 mins        Isabel [name] PT Acute Rehabilitation Services Pager [phone] Office [phone]   Buelah Rennie 12/09/2021, 10:29 AM

## 2021-12-09 NOTE — TOC Initial Note (Signed)
Transition of Care [facility]) - Initial/Assessment Note   Patient Details  Name: Isabel [name] MRN: [id number] Date of Birth: 1956-02-24  [facility]) CM/SW Contact:    Sherie Don, LCSW Phone Number: 12/09/2021, 2:52 PM  Clinical Narrative: PT evaluation recommended SNF. CSW met with patient to discuss recommendations. Per patient, she would prefer to discharge home if possible, but would need a hospital bed, 3N1, and rolling walker as she needs to keep her leg at a specific angle. Patient's previous insurance, [employer], no longer exists so patient will reach out to her husband to get the new insurance card so that [facility] and DME can be set up if the patient is able to discharge home.  Expected Discharge Plan: [place] Barriers to Discharge: Continued Medical Work up  Patient Goals and CMS Choice Patient states their goals for this hospitalization and ongoing recovery are:: Return home if possible CMS Medicare.gov Compare Post Acute Care list provided to:: Patient Choice offered to / list presented to : Patient  Expected Discharge Plan and Services Expected Discharge Plan: [place] In-house Referral: Clinical Social Work Post Acute Care Choice: Durable Medical Equipment, Home Health Living arrangements for the past 2 months: Single Family Home  Prior Living Arrangements/Services Living arrangements for the past 2 months: Single Family Home Lives with:: Spouse Patient language and need for interpreter reviewed:: Yes Do you feel safe going back to the place where you live?: Yes      Need for Family Participation in Patient Care: Yes (Comment) Care giver support system in place?: Yes (comment) Current home services: DME (Cane, crutches) Criminal Activity/Legal Involvement Pertinent to Current Situation/Hospitalization: No - Comment as needed  Activities of Daily Living Home Assistive Devices/Equipment: None ADL Screening  (condition at time of admission) Patient's cognitive ability adequate to safely complete daily activities?: Yes Is the patient deaf or have difficulty hearing?: No Does the patient have difficulty seeing, even when wearing glasses/contacts?: No Does the patient have difficulty concentrating, remembering, or making decisions?: No Patient able to express need for assistance with ADLs?: Yes Does the patient have difficulty dressing or bathing?: Yes Independently performs ADLs?: No Communication: Independent Dressing (OT): Needs assistance Is this a change from baseline?: Pre-admission baseline Grooming: Needs assistance Is this a change from baseline?: Pre-admission baseline Feeding: Independent Bathing: Needs assistance Is this a change from baseline?: Pre-admission baseline Toileting: Needs assistance Is this a change from baseline?: Pre-admission baseline In/Out Bed: Needs assistance Is this a change from baseline?: Pre-admission baseline Walks in Home: Needs assistance Is this a change from baseline?: Pre-admission baseline Does the patient have difficulty walking or climbing stairs?: Yes Weakness of Legs: Left Weakness of Arms/Hands: None  Permission Sought/Granted Permission sought to share information with : Other (comment) Permission granted to share information with : Yes, Verbal Permission Granted Permission granted to share info w AGENCY: [place] agencies, DME company  Emotional Assessment Appearance:: Appears stated age Attitude/Demeanor/Rapport: Engaged Affect (typically observed): Accepting Orientation: : Oriented to Self, Oriented to Place, Oriented to  Time, Oriented to Situation Alcohol / Substance Use: Not Applicable Psych Involvement: No (comment)  Admission diagnosis:  Closed left hip fracture ([place]) [S72.002A] Closed fracture of left hip, initial encounter [facility]) [S72.002A] Patient Active Problem List   Diagnosis Date Noted   Fever 12/05/2021   Acute lower UTI  12/05/2021   Left hip pain 12/03/2021   Fall at home, initial encounter 12/03/2021   Periprosthetic fracture around  internal prosthetic hip joint    Closed left hip fracture ([place]) 12/02/2021   Mass of joint of right wrist 10/20/2020   Arthritis of carpometacarpal [facility]) joint of right thumb 10/20/2020   Synovitis of toe 06/21/2019   Arthritis of midfoot 06/21/2019   Pain in hip region after total hip replacement ([place]) 06/05/2018   Multinodular goiter 01/11/2017   Varicosities of leg 08/07/2015   Osteoarthritis 08/07/2015   Malignant neoplasm of upper-outer quadrant of left female breast ([place]) 10/13/2011   PCP:  Caren Macadam, MD Pharmacy:   [facility] [place] [address] Phone: [phone] Fax: [phone]  Readmission Risk Interventions No flowsheet data found.

## 2021-12-09 NOTE — Progress Notes (Signed)
PROGRESS NOTE    Isabel [name]  LKT:[id number] DOB: 18-Jun-1956 DOA: 12/02/2021 PCP: Caren Macadam, MD    Chief Complaint  Patient presents with   Hip Pain    Brief Narrative: Patient is a 66 year old female history of osteoarthritis with bilateral hip resurfacing and possible metal reaction, history of breast cancer presenting to the ED when walking felt a pop in the left hip with immediate pain and inability to bear weight.  Patient denies any fall.  No syncopal episode.  No chest pain.  Patient seen in the ED plain films and consistent with a left femoral neck fracture.  Patient admitted.  Orthopedics consulted for further evaluation.   Assessment & Plan:   Principal Problem:   Closed left hip fracture [facility]) Active Problems:   Left hip pain   Fall at home, initial encounter   Periprosthetic fracture around internal prosthetic hip joint   Fever   Acute lower UTI   Postoperative anemia due to acute blood loss   1 closed left hip femoral neck fracture -Patient states was ambulating and walking and placed too much pressure on her left lower extremity heard a pop with immediate pain with inability to bear weight. -Plain films consistent with left femoral neck fracture. 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  Commuted left femoral neck fracture with a fracture cleft extending along the medial aspect into the proximal diaphysis. -Preop EKG with normal sinus rhythm. -Patient with no chest pain.  No history of coronary artery disease. -Patient did state had been having some intermittent palpitations and was scheduled for outpatient 2D echo. -2D echo with a EF of 60-65%,  NWMA.  -Patient seen in consultation by orthopedics and patient underwent revision of left hip resurfacing, conversion to total hip arthroplasty per Dr. Lyla Glassing 12/08/2020.  -It is noted by orthopedics note that they called [facility] at patient's request and spoke to Daisey Must who was unable to complete surgery in a more timely fashion as no orthopedics beds available. -PT/OT, TD WB AT left lower extremity with walker x12 weeks per orthopedic recommendations. -We will likely need SNF. -Per orthopedics.  2.  Fall ruled out.  3.  Postop acute blood loss anemia/symptomatic anemia -Postoperatively patient noted to have a hemoglobin of 7.5 this morning with no overt GI bleed. -Patient noted to be symptomatic as when working with PT blood pressure dropped into the 90s and patient with complaints of lightheadedness and dizziness and placed back in bed. -Anemia panel with iron level of 13, TIBC of 188, ferritin of 89, folate of 22.5 -Due to symptomatic anemia, significant drop in hemoglobin postoperatively we will transfuse 2 units packed red blood cells. -Follow H&H.  4.  Hyponatremia -Likely secondary to hypovolemic hyponatremia as urine sodium noted to be < 10.  Urinalysis with concerns for possible UTI.   -Sodium levels improved with hydration. -Patient on gentle hydration. -Repeat labs in AM.   5.  Fever/probable UTI -Patient with some complaints of suprapubic abdominal discomfort. -Urinalysis done with large leukocytes, nitrite negative, 6-10 WBCs. -Urine cultures with no growth to date.  -Patient noted to have a fever of 101.4 (12/05/2021). -Blood cultures with no growth to date x3 days. -Chest x-ray with no acute infiltrate.  -  Status post 3 days IV Rocephin.   -Afebrile x48 to 72 hours. -No further antibiotics needed.   -Incentive spirometry, flutter valve.    6.  Hypokalemia -Repleted -Potassium at 3.6.    DVT prophylaxis: Aspirin per orthopedics. Code Status: Full Family  Communication: Updated patient.  Updated husband at bedside.  Disposition:   Status is: Inpatient  Remains inpatient appropriate because: Severity of illness.       Consultants:  Orthopedics: Hilbert Odor, PA 12/03/2021/Dr. Swinteck  Procedures:  CT left hip 12/03/2021 Plain films of the left knee 12/03/2021 Chest x-ray 12/02/2021 Plain films of the left hip and pelvis 12/02/2021. 2D echo 12/04/2021 Transfusion 2 units packed red blood cells pending 12/09/2021 Revision left hip resurfacing, conversion to total hip arthroplasty per Dr. Lyla Glassing 12/08/2020    Antimicrobials:  IV Rocephin 12/05/2021>>> 12/07/2021   Subjective: Patient laying in bed, husband at bedside.  Patient denies any shortness of breath, no chest pain.  Still with some left hip pain however improved significantly postoperatively since admission.  Denies any bloody bowel movements.  Stated was about to work with physical therapy however when she sat on the side of the bed her blood pressure dropped significantly, heart rate elevated, and felt lightheaded and dizzy and placed back in bed.    Objective: Vitals:   12/09/21 1023 12/09/21 1309 12/09/21 1333 12/09/21 1516  BP:  (!) 101/53 (!) 98/55 (!) 99/56  Pulse: 96 96 93 87  Resp:  20 20 18   Temp:  99.8 F (37.7 C) 99 F (37.2 C) 98.3 F (36.8 C)  TempSrc:  Oral Oral Oral  SpO2:  93% 95% 96%  Weight:      Height:        Intake/Output Summary (Last 24 hours) at 12/09/2021 1533 Last data filed at 12/09/2021 1300 Gross per 24 hour  Intake 6563.52 ml  Output 3350 ml  Net 3213.52 ml   Filed Weights   12/06/21 0626 12/07/21 0500 12/08/21 1319  Weight: 65.5 kg 65.3 kg 65.3 kg    Examination:  General exam: Pallor. Respiratory system: Lungs clear to auscultation bilaterally.  No wheezes, no crackles, no rhonchi.  Normal respiratory effort.  Speaking in full sentences.   Cardiovascular system: Regular rate rhythm no murmurs rubs or gallops.  No JVD.  No  lower extremity edema. Gastrointestinal system: Abdomen is soft, nontender, nondistended, positive bowel sounds.  No rebound.  No guarding.  Central nervous system: Alert and oriented. No focal neurological deficits. Extremities: No clubbing cyanosis or edema.  Left hip with ice on it, decreased tenderness to palpation.  Skin: No rashes, lesions or ulcers Psychiatry: Judgement and insight appear normal. Mood & affect appropriate.     Data Reviewed: I have personally reviewed following labs and imaging studies  CBC: Recent Labs  Lab 12/02/21 2043 12/03/21 0101 12/06/21 0333 12/07/21 0315 12/08/21 0839 12/09/21 0328  WBC 7.5 8.0 4.9 5.3 4.6 8.2  NEUTROABS 4.9 5.5 3.2  --  2.7  --   HGB 13.0 13.1 12.4 12.5 13.6 7.5*  HCT 39.9 39.1 37.4 38.2 40.0 21.7*  MCV 85.3 84.1 84.0 84.0 81.3 82.5  PLT 231 214 206 220 240 825    Basic Metabolic Panel: Recent Labs  Lab 12/03/21 0101 12/04/21 0343 12/05/21 0315 12/06/21 0333 12/07/21 0315 12/08/21 0839 12/09/21 0328  NA 136 129* 131* 134* 133* 135 133*  K 3.6 3.8 3.4* 3.9 4.3 3.8 3.6  CL 106 94* 98 104 101 101 102  CO2 25 27  27 26 27 25 23   GLUCOSE 104* 102* 101* 106* 103* 103* 215*  BUN 11 14 9  7* 8 9 12   CREATININE 0.51 0.42* 0.45 0.41* 0.50 0.40* 0.58  CALCIUM 8.7* 9.2 8.9 8.5* 8.7* 9.2 8.2*  MG 2.4 2.0  --  1.9  --  2.2  --     GFR: Estimated Creatinine Clearance: 66.5 mL/min (by C-G formula based on SCr of 0.58 mg/dL).  Liver Function Tests: Recent Labs  Lab 12/03/21 0101  AST 23  ALT 20  ALKPHOS 63  BILITOT 0.7  PROT 6.9  ALBUMIN 4.3    CBG: No results for input(s): GLUCAP in the last 168 hours.   Recent Results (from the past 240 hour(s))  Resp Panel by RT-PCR (Flu A&B, Covid) Nasopharyngeal Swab     Status: None   Collection Time: 12/02/21  9:36 PM   Specimen: Nasopharyngeal Swab; Nasopharyngeal(NP) swabs in vial transport medium  Result Value Ref Range Status   SARS Coronavirus 2 by RT PCR NEGATIVE  NEGATIVE Final    Comment: (NOTE) SARS-CoV-2 target nucleic acids are NOT DETECTED.  The SARS-CoV-2 RNA is generally detectable in upper respiratory specimens during the acute phase of infection. The lowest concentration of SARS-CoV-2 viral copies this assay can detect is 138 copies/mL. A negative result does not preclude SARS-Cov-2 infection and should not be used as the sole basis for treatment or other patient management decisions. A negative result may occur with  improper specimen collection/handling, submission of specimen other than nasopharyngeal swab, presence of viral mutation(s) within the areas targeted by this assay, and inadequate number of viral copies(<138 copies/mL). A negative result must be combined with clinical observations, patient history, and epidemiological information. The expected result is Negative.  Fact Sheet for Patients:  EntrepreneurPulse.com.au  Fact Sheet for Healthcare Providers:  IncredibleEmployment.be  This test is no t yet approved or cleared by the [place] FDA and  has been authorized for detection and/or diagnosis of SARS-CoV-2 by FDA under an Emergency Use Authorization (EUA). This EUA will remain  in effect (meaning this test can be used) for the duration of the COVID-19 declaration under Section 564(b)(1) of the Act, 21 U.S.C.section 360bbb-3(b)(1), unless the authorization is terminated  or revoked sooner.       Influenza A by PCR NEGATIVE NEGATIVE Final   Influenza B by PCR NEGATIVE NEGATIVE Final    Comment: (NOTE) The Xpert Xpress SARS-CoV-2/FLU/RSV plus assay is intended as an aid in the diagnosis of influenza from Nasopharyngeal swab specimens and should not be used as a sole basis for treatment. Nasal washings and aspirates are unacceptable for Xpert Xpress SARS-CoV-2/FLU/RSV testing.  Fact Sheet for Patients: EntrepreneurPulse.com.au  Fact Sheet for Healthcare  Providers: IncredibleEmployment.be  This test is not yet approved or cleared by the [place] FDA and has been authorized for detection and/or diagnosis of SARS-CoV-2 by FDA under an Emergency Use Authorization (EUA). This EUA will remain in effect (meaning this test can be used) for the duration of the COVID-19 declaration under Section 564(b)(1) of the Act, 21 U.S.C. section 360bbb-3(b)(1), unless the authorization is terminated or revoked.  Performed at [facility], [place] [address] [place], [place] [address]   Urine Culture     Status: None   Collection Time: 12/05/21  9:00 AM   Specimen: Urine, Clean Catch  Result Value Ref Range Status   Specimen Description   Final    URINE, CLEAN CATCH Performed at [facility],  [place] [address] [place], [place] [address]    Special Requests   Final    NONE Performed at [facility], [place] [address], [place], [place] [address]    Culture   Final    NO GROWTH Performed at [facility], [place] [address] [place], [place] [address]    Report Status 12/06/2021 FINAL  Final  Culture, blood (Routine X 2) w Reflex to ID Panel     Status: None (Preliminary result)   Collection Time: 12/05/21  4:43 PM   Specimen: BLOOD  Result Value Ref Range Status   Specimen Description   Final    BLOOD RIGHT ANTECUBITAL Performed at [place] [address], [place], [place] [address]    Special Requests   Final    BOTTLES DRAWN AEROBIC ONLY Blood Culture adequate volume Performed at [place] [address], [place], [place] [address]    Culture   Final    NO GROWTH 4 DAYS Performed at [facility], [place] [address] [place], [place] [address]    Report Status PENDING  Incomplete  Culture, blood (Routine X 2) w Reflex to ID Panel     Status: None (Preliminary result)   Collection Time: 12/05/21  4:50 PM   Specimen:  BLOOD  Result Value Ref Range Status   Specimen Description   Final    BLOOD RIGHT ANTECUBITAL Performed at [place] [address], [place], [place] [address]    Special Requests   Final    BOTTLES DRAWN AEROBIC ONLY Blood Culture adequate volume Performed at [place] [address] [place], [place] [address]    Culture   Final    NO GROWTH 4 DAYS Performed at [facility], [place] [address] [place], [place] [address]    Report Status PENDING  Incomplete  Surgical PCR screen     Status: None   Collection Time: 12/07/21  7:32 PM   Specimen: Nasal Mucosa; Nasal Swab  Result Value Ref Range Status   MRSA, PCR NEGATIVE NEGATIVE Final   Staphylococcus aureus NEGATIVE NEGATIVE Final    Comment: (NOTE) The Xpert SA Assay (FDA approved for NASAL specimens in patients 17 years of age and older), is one component of a comprehensive surveillance program. It is not intended to diagnose infection nor to guide or monitor treatment. Performed at [facility] [place] [address] [place], [place] [address]           Radiology Studies: DG Pelvis Portable  Result Date: 12/08/2021 CLINICAL DATA:  Status post left hip revision. EXAM: PORTABLE PELVIS 1-2 VIEWS COMPARISON:  Operative radiograph dated 12/08/2021. FINDINGS: Total left hip arthroplasty appears intact and in anatomic alignment. Prior arthroplasty of the right femoral head. No acute fracture or dislocation. The bones are osteopenic with degenerative changes of the lower lumbar spine. Postsurgical changes in the soft tissues of the left hip. IMPRESSION: Left hip arthroplasty appears intact and in anatomic alignment. Electronically Signed   By: Anner Crete M.D.   On: 12/08/2021 20:19   DG C-Arm 1-60 Min-No Report  Result Date: 12/08/2021 Fluoroscopy was utilized by the requesting physician.  No radiographic interpretation.   DG C-Arm 1-60 Min-No Report  Result  Date: 12/08/2021 Fluoroscopy was utilized by the requesting physician.  No radiographic interpretation.   DG C-Arm 1-60 Min-No Report  Result Date: 12/08/2021 Fluoroscopy was utilized by the requesting physician.  No  radiographic interpretation.   DG C-Arm 1-60 Min-No Report  Result Date: 12/08/2021 Fluoroscopy was utilized by the requesting physician.  No radiographic interpretation.   DG HIP OPERATIVE UNILAT W OR W/O PELVIS LEFT  Result Date: 12/08/2021 CLINICAL DATA:  Revision of hip arthroplasty. EXAM: OPERATIVE LEFT HIP (WITH PELVIS IF PERFORMED) 2 VIEWS TECHNIQUE: Fluoroscopic spot image(s) were submitted for interpretation post-operatively. COMPARISON:  Left hip x-ray 12/02/2021. FINDINGS: Left hip total arthroplasty is present in anatomic alignment. Right hip arthroplasty is partially visualized. FLUOROSCOPY TIME:  21 seconds. IMPRESSION: 1. New left hip total arthroplasty in anatomic alignment. Electronically Signed   By: Ronney Asters M.D.   On: 12/08/2021 19:48        Scheduled Meds:  sodium chloride   Intravenous Once   aspirin  81 mg Oral BID   celecoxib  200 mg Oral BID   Continuous Infusions:  sodium chloride 150 mL/hr at 12/08/21 2144   methocarbamol (ROBAXIN) IV 500 mg (12/08/21 2034)     LOS: 7 days    Time spent: 35 minutes    Irine Seal, MD [facility]   To contact the attending provider between 7A-7P or the covering provider during after hours 7P-7A, please log into the web site [email or web address] and access using universal [place] password for that web site. If you do not have the password, please call the hospital operator.  12/09/2021, 3:33 PM

## 2021-12-10 LAB — BPAM RBC
Blood Product Expiration Date: 202301092359
Blood Product Expiration Date: 202301232359
ISSUE DATE / TIME: 202301051309
ISSUE DATE / TIME: 202301051652
Unit Type and Rh: 9500
Unit Type and Rh: 9500

## 2021-12-10 LAB — TYPE AND SCREEN
ABO/RH(D): O NEG
Antibody Screen: NEGATIVE
Unit division: 0
Unit division: 0

## 2021-12-10 LAB — CBC
HCT: 25.5 % — ABNORMAL LOW (ref 36.0–46.0)
Hemoglobin: 8.7 g/dL — ABNORMAL LOW (ref 12.0–15.0)
MCH: 28.8 pg (ref 26.0–34.0)
MCHC: 34.1 g/dL (ref 30.0–36.0)
MCV: 84.4 fL (ref 80.0–100.0)
Platelets: 161 10*3/uL (ref 150–400)
RBC: 3.02 MIL/uL — ABNORMAL LOW (ref 3.87–5.11)
RDW: 13.5 % (ref 11.5–15.5)
WBC: 8.2 10*3/uL (ref 4.0–10.5)
nRBC: 0 % (ref 0.0–0.2)

## 2021-12-10 LAB — BASIC METABOLIC PANEL
Anion gap: 6 (ref 5–15)
BUN: 9 mg/dL (ref 8–23)
CO2: 24 mmol/L (ref 22–32)
Calcium: 8.1 mg/dL — ABNORMAL LOW (ref 8.9–10.3)
Chloride: 106 mmol/L (ref 98–111)
Creatinine, Ser: 0.45 mg/dL (ref 0.44–1.00)
GFR, Estimated: >60 mL/min (ref >60)
Glucose, Bld: 103 mg/dL — ABNORMAL HIGH (ref 70–99)
Potassium: 3.6 mmol/L (ref 3.5–5.1)
Sodium: 136 mmol/L (ref 135–145)

## 2021-12-10 LAB — CULTURE, BLOOD (ROUTINE X 2)
Culture: NO GROWTH
Culture: NO GROWTH
Special Requests: ADEQUATE
Special Requests: ADEQUATE

## 2021-12-10 LAB — MAGNESIUM: Magnesium: 2 mg/dL (ref 1.7–2.4)

## 2021-12-10 LAB — HEMOGLOBIN AND HEMATOCRIT, BLOOD
HCT: 29.2 % — ABNORMAL LOW (ref 36.0–46.0)
Hemoglobin: 9.8 g/dL — ABNORMAL LOW (ref 12.0–15.0)

## 2021-12-10 NOTE — Care Management (Signed)
Patient has a closed left hip fracture which requires patient's left hip to be positioned in ways not feasible with a normal bed. Hip fracture requires frequent and immediate changes in body position which cannot be achieved with a normal bed.

## 2021-12-10 NOTE — Progress Notes (Signed)
Physical Therapy Treatment Patient Details Name: Isabel [name] MRN: [id number] DOB: 07/12/1956 Today's Date: 12/10/2021   History of Present Illness Pt s/p L THR revision from hip resurfacing.  Pt with hx of bil hip resurfacing and breast CA    PT Comments    Pt very motivated and with good progress with mobility.  Pt up to ambulate limited distance in room and then up to chair for bfast.  No reports of dizziness with BP supine 111/55; sitting 117/55; standing 102/60 and after ambulation 102/64.  Pt hopeful to progress to dc home rather than SNF level rehab.   Recommendations for follow up therapy are one component of a multi-disciplinary discharge planning process, led by the attending physician.  Recommendations may be updated based on patient status, additional functional criteria and insurance authorization.  Follow Up Recommendations  Skilled nursing-short term rehab (<3 hours/day)     Assistance Recommended at Discharge Frequent or constant Supervision/Assistance  Patient can return home with the following     Equipment Recommendations  Rolling walker (2 wheels);BSC/3in1    Recommendations for Other Services       Precautions / Restrictions Precautions Precautions: Posterior Hip Precaution Comments: Pt recalls 2/4 THP without cues.  All THP reviewed Restrictions LLE Weight Bearing: Touchdown weight bearing     Mobility  Bed Mobility Overal bed mobility: Needs Assistance Bed Mobility: Supine to Sit     Supine to sit: Min assist;+2 for physical assistance;+2 for safety/equipment     General bed mobility comments: cues for sequence, adherence to THP and use of R LE to self assist    Transfers Overall transfer level: Needs assistance Equipment used: Rolling walker (2 wheels) Transfers: Sit to/from Stand Sit to Stand: From elevated surface;+2 safety/equipment;Min assist           General transfer comment: cues for LE management and use of UEs to self  assist    Ambulation/Gait Ambulation/Gait assistance: Min assist;+2 safety/equipment Gait Distance (Feet): 8 Feet Assistive device: Rolling walker (2 wheels) Gait Pattern/deviations: Step-to pattern;Decreased step length - right;Decreased step length - left;Shuffle;Trunk flexed Gait velocity: decr     General Gait Details: cues for sequence, posture and position from RW; noted difficulty advancing L LE; distance ltd by fatigue   Stairs             Wheelchair Mobility    Modified Rankin (Stroke Patients Only)       Balance Overall balance assessment: Needs assistance Sitting-balance support: No upper extremity supported;Feet supported Sitting balance-Leahy Scale: Fair     Standing balance support: Bilateral upper extremity supported Standing balance-Leahy Scale: Poor                              Cognition Arousal/Alertness: Awake/alert Behavior During Therapy: WFL for tasks assessed/performed Overall Cognitive Status: Within Functional Limits for tasks assessed                                          Exercises Total Joint Exercises Ankle Circles/Pumps: AROM;Both;15 reps;Supine Quad Sets: AROM;Both;10 reps;Supine Heel Slides: AAROM;Left;20 reps;Supine Hip ABduction/ADduction: AAROM;Left;15 reps;Supine    General Comments        Pertinent Vitals/Pain Pain Assessment: 0-10 Pain Score: 3  Pain Location: L hip Pain Descriptors / Indicators: Sore Pain Intervention(s): Limited activity within patient's tolerance;Monitored during session;Ice applied  Home Living                          Prior Function            PT Goals (current goals can now be found in the care plan section) Acute Rehab PT Goals Patient Stated Goal: Regain IND PT Goal Formulation: With patient Time For Goal Achievement: 12/23/21 Potential to Achieve Goals: Good Progress towards PT goals: Progressing toward goals    Frequency     7X/week      PT Plan Current plan remains appropriate    Co-evaluation              AM-PAC PT "6 Clicks" Mobility   Outcome Measure  Help needed turning from your back to your side while in a flat bed without using bedrails?: A Little Help needed moving from lying on your back to sitting on the side of a flat bed without using bedrails?: A Little Help needed moving to and from a bed to a chair (including a wheelchair)?: A Lot Help needed standing up from a chair using your arms (e.g., wheelchair or bedside chair)?: A Lot Help needed to walk in hospital room?: Total Help needed climbing 3-5 steps with a railing? : Total 6 Click Score: 12    End of Session Equipment Utilized During Treatment: Gait belt Activity Tolerance: Patient tolerated treatment well;Patient limited by fatigue Patient left: in chair;with call bell/phone within reach;with nursing/sitter in room Nurse Communication: Mobility status PT Visit Diagnosis: Difficulty in walking, not elsewhere classified (R26.2);Pain Pain - Right/Left: Left Pain - part of body: Hip     Time: 6387-5643 PT Time Calculation (min) (ACUTE ONLY): 35 min  Charges:  $Gait Training: 8-22 mins $Therapeutic Exercise: 8-22 mins                     [place] Pager [phone] Office [phone]    [facility] 12/10/2021, 8:48 AM

## 2021-12-10 NOTE — Progress Notes (Signed)
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  Pt with hx of bil hip resurfacing and breast CA   OT comments  Patient worked on use of AE for LB dressing and improving ambulation distance and tolerance as needed for toileting in bathroom. Patient making progress. Will continue to follow.   Recommendations for follow up therapy are one component of a multi-disciplinary discharge planning process, led by the attending physician.  Recommendations may be updated based on patient status, additional functional criteria and insurance authorization.    Follow Up Recommendations  Home health OT    Assistance Recommended at Discharge Intermittent Supervision/Assistance  Patient can return home with the following  A little help with bathing/dressing/bathroom;Assistance with cooking/housework;A lot of help with walking and/or transfers;Help with stairs or ramp for entrance   Equipment Recommendations  BSC/3in1    Recommendations for Other Services      Precautions / Restrictions Precautions Precautions: Posterior Hip Precaution Booklet Issued: Yes (comment) Precaution Comments: Pt recalls 2/4 THP without cues.  All THP reviewed Restrictions Weight Bearing Restrictions: Yes RLE Weight Bearing: Touchdown weight bearing LLE Weight Bearing: Touchdown weight bearing       Mobility Bed Mobility                    Transfers                         Balance Overall balance assessment: Needs assistance Sitting-balance support: No upper extremity supported;Feet supported Sitting balance-Leahy Scale: Good     Standing balance support: Reliant on assistive device for balance                               ADL either performed or assessed with clinical judgement   ADL Overall ADL's : Needs  assistance/impaired                     Lower Body Dressing: Supervision/safety Lower Body Dressing Details (indicate cue type and reason): patient donned underwear with LHR, donned and doff socks with equipment.               General ADL Comments: Patient introduced to ADL kit and educated on use. Patient able to stand with min guard and RW and maintain balance and TDWB status with dressing. Patient able to ambualte approx 10 feet in room working on improving activity tolerance and distance needed for toileting.    Extremity/Trunk Assessment              Vision Patient Visual Report: No change from baseline     Perception     Praxis      Cognition Arousal/Alertness: Awake/alert Behavior During Therapy: WFL for tasks assessed/performed Overall Cognitive Status: Within Functional Limits for tasks assessed                                            Exercises     Shoulder Instructions       General Comments      Pertinent Vitals/ Pain       Pain Assessment: No/denies pain  Home Living  Prior Functioning/Environment              Frequency  Min 2X/week        Progress Toward Goals  OT Goals(current goals can now be found in the care plan section)  Progress towards OT goals: Progressing toward goals  Acute Rehab OT Goals Patient Stated Goal: to improve independence OT Goal Formulation: With patient Time For Goal Achievement: 12/23/21 Potential to Achieve Goals: Good  Plan Discharge plan remains appropriate    Co-evaluation          OT goals addressed during session: ADL's and self-care;Other (comment) (activity tolerance)      AM-PAC OT "6 Clicks" Daily Activity     Outcome Measure   Help from another person eating meals?: None Help from another person taking care of personal grooming?: A Little Help from another person toileting, which includes using  toliet, bedpan, or urinal?: A Lot Help from another person bathing (including washing, rinsing, drying)?: A Lot Help from another person to put on and taking off regular upper body clothing?: A Little Help from another person to put on and taking off regular lower body clothing?: A Lot 6 Click Score: 16    End of Session Equipment Utilized During Treatment: Rolling walker (2 wheels);Gait belt  OT Visit Diagnosis: Other abnormalities of gait and mobility (R26.89);Muscle weakness (generalized) (M62.81);Pain   Activity Tolerance Patient tolerated treatment well   Patient Left in chair;with call bell/phone within reach   Nurse Communication Mobility status        Time: 1130-1151 OT Time Calculation (min): 21 min  Charges: OT General Charges $OT Visit: 1 Visit OT Treatments $Self Care/Home Management : 8-22 mins  Derl Barrow, OTR/L [place]  Office [phone] Pager: [place] 12/10/2021, 1:32 PM

## 2021-12-10 NOTE — Plan of Care (Signed)
  Problem: Education: Goal: Knowledge of General Education information will improve Description Including pain rating scale, medication(s)/side effects and non-pharmacologic comfort measures Outcome: Progressing   

## 2021-12-10 NOTE — Progress Notes (Signed)
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  Pt with hx of bil hip resurfacing and breast CA    PT Comments    Pt continues motivated and with marked improvement in activity tolerance and with good compliance with THP and TDWB.  Pt now hopeful for dc home with HHPT follow up rather than rehab setting.   Recommendations for follow up therapy are one component of a multi-disciplinary discharge planning process, led by the attending physician.  Recommendations may be updated based on patient status, additional functional criteria and insurance authorization.  Follow Up Recommendations  Home health PT     Assistance Recommended at Discharge Frequent or constant Supervision/Assistance  Patient can return home with the following     Equipment Recommendations  Rolling walker (2 wheels);BSC/3in1;Wheelchair (measurements PT);Wheelchair cushion (measurements PT) (Possible hospital bed dependent on progress with stairs)    Recommendations for Other Services       Precautions / Restrictions Precautions Precautions: Posterior Hip Precaution Booklet Issued: Yes (comment) Precaution Comments: Pt recalls 2/4 THP without cues.  All THP reviewed Restrictions Weight Bearing Restrictions: Yes LLE Weight Bearing: Touchdown weight bearing     Mobility  Bed Mobility               General bed mobility comments: Pt up in chair and requests back to same    Transfers Overall transfer level: Needs assistance Equipment used: Rolling walker (2 wheels) Transfers: Sit to/from Stand Sit to Stand: From elevated surface;Min assist           General transfer comment: cues for LE management and use of UEs to self assist    Ambulation/Gait Ambulation/Gait assistance: Min assist Gait Distance (Feet): 39 Feet Assistive device: Rolling walker  (2 wheels) Gait Pattern/deviations: Step-to pattern;Decreased step length - right;Decreased step length - left;Shuffle;Trunk flexed Gait velocity: decr     General Gait Details: Increased time with cues for sequence, posture and position from RW;   Stairs             Wheelchair Mobility    Modified Rankin (Stroke Patients Only)       Balance Overall balance assessment: Needs assistance Sitting-balance support: No upper extremity supported;Feet supported Sitting balance-Leahy Scale: Good     Standing balance support: Reliant on assistive device for balance Standing balance-Leahy Scale: Poor                              Cognition Arousal/Alertness: Awake/alert Behavior During Therapy: WFL for tasks assessed/performed Overall Cognitive Status: Within Functional Limits for tasks assessed                                          Exercises      General Comments        Pertinent Vitals/Pain Pain Assessment: 0-10 Pain Score: 2  Pain Location: L hip Pain Descriptors / Indicators: Sore Pain Intervention(s): Limited activity within patient's tolerance;Monitored during session;Ice applied    Home Living                          Prior Function            PT Goals (current  goals can now be found in the care plan section) Acute Rehab PT Goals Patient Stated Goal: Regain IND PT Goal Formulation: With patient Time For Goal Achievement: 12/23/21 Potential to Achieve Goals: Good Progress towards PT goals: Progressing toward goals    Frequency    7X/week      PT Plan Discharge plan needs to be updated    Co-evaluation       OT goals addressed during session: ADL's and self-care;Other (comment) (activity tolerance)      AM-PAC PT "6 Clicks" Mobility   Outcome Measure  Help needed turning from your back to your side while in a flat bed without using bedrails?: A Little Help needed moving from lying on your  back to sitting on the side of a flat bed without using bedrails?: A Little Help needed moving to and from a bed to a chair (including a wheelchair)?: A Little Help needed standing up from a chair using your arms (e.g., wheelchair or bedside chair)?: A Little Help needed to walk in hospital room?: A Little Help needed climbing 3-5 steps with a railing? : A Lot 6 Click Score: 17    End of Session Equipment Utilized During Treatment: Gait belt Activity Tolerance: Patient tolerated treatment well Patient left: in chair;with call bell/phone within reach;with nursing/sitter in room;with family/visitor present Nurse Communication: Mobility status PT Visit Diagnosis: Difficulty in walking, not elsewhere classified (R26.2);Pain Pain - Right/Left: Left Pain - part of body: Hip     Time: 8182-9937 PT Time Calculation (min) (ACUTE ONLY): 23 min  Charges:  $Gait Training: 8-22 mins                     [place] Pager [phone] Office [phone]    Gennie Dib 12/10/2021, 3:38 PM

## 2021-12-10 NOTE — TOC Progression Note (Signed)
Transition of Care [facility]) - Progression Note   Patient Details  Name: Marvia Troost MRN: [id number] Date of Birth: 1956/12/01  Transition of Care [facility]) CM/SW [place], LCSW Phone Number: 12/10/2021, 3:52 PM  Clinical Narrative: Patient will need HH and DME. DME referral sent to [place] with [facility]. [facility] to deliver rolling walker, 3N1, and a hospital bed. CSW referred patient to the following [place] agencies:  [facility]: out-of-network [facility]: out-of-network [facility]: out-of-network [facility]: declined due to [employer] [facility]: out-of-network [facility] under review [facility]: under review  Expected Discharge Plan: [place] Barriers to Discharge: Continued Medical Work up, No [place] will accept this patient  Expected Discharge Plan and Services Expected Discharge Plan: [place] In-house Referral: Clinical Social Work Post Acute Care Choice: Museum/gallery conservator, Home Health Living arrangements for the past 2 months: [place]           DME Arranged: Hospital bed, 3-N-1, Walker rolling DME Agency: [facility] Date DME Agency Contacted: 12/10/21 Representative spoke with at DME Agency: Andee Poles  Readmission Risk Interventions No flowsheet data found.

## 2021-12-10 NOTE — Progress Notes (Signed)
PROGRESS NOTE    Isabel [name]  ZOX:[id number] DOB: Dec 01, 1956 DOA: 12/02/2021 PCP: Caren Macadam, MD    Chief Complaint  Patient presents with   Hip Pain    Brief Narrative: Patient is a 66 year old female history of osteoarthritis with bilateral hip resurfacing and possible metal reaction, history of breast cancer presenting to the ED when walking felt a pop in the left hip with immediate pain and inability to bear weight.  Patient denies any fall.  No syncopal episode.  No chest pain.  Patient seen in the ED plain films and consistent with a left femoral neck fracture.  Patient admitted.  Orthopedics consulted for further evaluation.  Patient subsequently underwent hip repair 12/09/2021, seen by PT OT who are recommending SNF   Assessment & Plan:   Principal Problem:   Closed left hip fracture [facility]) Active Problems:   Left hip pain   Fall at home, initial encounter   Periprosthetic fracture around internal prosthetic hip joint   Fever   Acute lower UTI   Postoperative anemia due to acute blood loss   1 closed left hip femoral neck fracture -Patient states was ambulating and walking and placed too much pressure on her left lower extremity heard a pop with immediate pain with inability to bear weight. -Plain films consistent with left femoral neck fracture. 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  Commuted left femoral neck fracture with a fracture cleft extending along the medial aspect into the proximal diaphysis. -Preop EKG with normal sinus rhythm. -Patient with no chest pain.  No history of coronary artery disease. -Patient did state had been having some  intermittent palpitations and was scheduled for outpatient 2D echo. -2D echo with a EF of 60-65%, NWMA.  -Patient seen in consultation by orthopedics and patient underwent revision of left hip resurfacing, conversion to total hip arthroplasty per Dr. Lyla Glassing 12/08/2020.  -It is noted by orthopedics note that they called [facility] at patient's request and spoke to Daisey Must who was unable to complete surgery in a more timely fashion as no orthopedics beds available. -PT/OT, TD WB AT left lower extremity with walker x12 weeks per orthopedic recommendations. -We will likely need SNF per PT. -Per patient states orthopedics feel she should be okay for home health therapy. -Per orthopedics.  2.  Fall ruled out.  3.  Postop acute blood loss anemia/symptomatic anemia -Postoperatively patient noted to have a hemoglobin of 7.5 )12/09/2021) with no overt GI bleed. -Patient noted to be symptomatic as when working with PT blood pressure dropped into the 90s and patient with complaints of lightheadedness and dizziness and placed back in bed. -Anemia panel with iron level of 13, TIBC of 188, ferritin of 89, folate of 22.5 -Due to symptomatic anemia, significant drop in hemoglobin postoperatively patient transfused 2 units packed red blood cells (12/09/2021 ). -Hemoglobin at 8.7 this morning.   -Repeat H&H this afternoon.   -Transfusion threshold hemoglobin < 8.  -Follow H&H.  4.  Hyponatremia -Likely secondary to hypovolemic hyponatremia as urine sodium noted to be < 10.  Urinalysis with concerns for possible UTI.   -Improved with hydration.   -Follow.   5.  Fever/probable UTI -Patient with some complaints of suprapubic abdominal discomfort. -  Urinalysis done with large leukocytes, nitrite negative, 6-10 WBCs. -Urine cultures with no growth to date.  -Patient noted to have a fever of 101.4 (12/05/2021). -Blood cultures negative after 5 days -Chest x-ray with no acute infiltrate.  -Status post 3 days IV  Rocephin.   -Afebrile. -No further antibiotics needed.   -Incentive spirometry, flutter valve.    6.  Hypokalemia -Repleted -Potassium at 3.6.   DVT prophylaxis: Aspirin per orthopedics. Code Status: Full Family Communication: Updated patient.  Updated husband at bedside.  Disposition:   Status is: Inpatient  Remains inpatient appropriate because: Severity of illness.       Consultants:  Orthopedics: Hilbert Odor, PA 12/03/2021/Dr. Swinteck  Procedures:  CT left hip 12/03/2021 Plain films of the left knee 12/03/2021 Chest x-ray 12/02/2021 Plain films of the left hip and pelvis 12/02/2021. 2D echo 12/04/2021 Transfusion 2 units packed red blood cells 12/09/2021 Revision left hip resurfacing, conversion to total hip arthroplasty per Dr. Lyla Glassing 12/08/2020    Antimicrobials:  IV Rocephin 12/05/2021>>> 12/07/2021   Subjective: Sitting up in chair.  Overall feeling better than she did yesterday.  Denies any dizziness, no lightheadedness.  No chest pain.  No shortness of breath.  No abdominal pain.  No overt bleeding.  Stated worked with physical therapy today.  Wellsite geologist is in favor of her going home with home health versus SNF.   Objective: Vitals:   12/09/21 1720 12/09/21 2025 12/09/21 2104 12/10/21 0503  BP: (!) 97/56 (!) 104/51 (!) 108/55 (!) 111/58  Pulse: 86 91 89 74  Resp: 20 17 17 17   Temp: 99.8 F (37.7 C) 99.4 F (37.4 C) 98.8 F (37.1 C) 98.3 F (36.8 C)  TempSrc: Oral Oral Oral Oral  SpO2: 95% 96% 96% 98%  Weight:      Height:        Intake/Output Summary (Last 24 hours) at 12/10/2021 1031 Last data filed at 12/10/2021 0842 Gross per 24 hour  Intake 2598.52 ml  Output 2800 ml  Net -201.48 ml    Filed Weights   12/06/21 0626 12/07/21 0500 12/08/21 1319  Weight: 65.5 kg 65.3 kg 65.3 kg    Examination:  General exam: Pallor. Respiratory system: CTA B.  No wheezes, no crackles, no rhonchi.  Normal respiratory effort.  Speaking in full  sentences.   Cardiovascular system: Regular rate and rhythm no murmurs rubs or gallops.  No JVD.  No lower extremity edema.  Gastrointestinal system: Abdomen is soft, nontender, nondistended, positive bowel sounds.  No rebound.  No guarding.  Central nervous system: Alert and oriented. No focal neurological deficits. Extremities: No clubbing cyanosis or edema.  Left hip with ice on it, decreased tenderness to palpation.  Skin: No rashes, lesions or ulcers Psychiatry: Judgement and insight appear normal. Mood & affect appropriate.     Data Reviewed: I have personally reviewed following labs and imaging studies  CBC: Recent Labs  Lab 12/06/21 0333 12/07/21 0315 12/08/21 0839 12/09/21 0328 12/10/21 0332  WBC 4.9 5.3 4.6 8.2 8.2  NEUTROABS 3.2  --  2.7  --   --   HGB 12.4 12.5 13.6 7.5* 8.7*  HCT 37.4 38.2 40.0 21.7* 25.5*  MCV 84.0 84.0 81.3 82.5 84.4  PLT 206 220 240 171 161     Basic Metabolic Panel: Recent Labs  Lab 12/04/21 0343 12/05/21 0315 12/06/21 0333 12/07/21 0315 12/08/21 0839 12/09/21 0328 12/10/21 0332  NA 129*   < > 134* 133* 135 133* 136  K 3.8   < >  3.9 4.3 3.8 3.6 3.6  CL 94*   < > 104 101 101 102 106  CO2 27   < > 26 27 25 23 24   GLUCOSE 102*   < > 106* 103* 103* 215* 103*  BUN 14   < > 7* 8 9 12 9   CREATININE 0.42*   < > 0.41* 0.50 0.40* 0.58 0.45  CALCIUM 9.2   < > 8.5* 8.7* 9.2 8.2* 8.1*  MG 2.0  --  1.9  --  2.2  --  2.0   < > = values in this interval not displayed.     GFR: Estimated Creatinine Clearance: 66.5 mL/min (by C-G formula based on SCr of 0.45 mg/dL).  Liver Function Tests: No results for input(s): AST, ALT, ALKPHOS, BILITOT, PROT, ALBUMIN in the last 168 hours.   CBG: No results for input(s): GLUCAP in the last 168 hours.   Recent Results (from the past 240 hour(s))  Resp Panel by RT-PCR (Flu A&B, Covid) Nasopharyngeal Swab     Status: None   Collection Time: 12/02/21  9:36 PM   Specimen: Nasopharyngeal Swab;  Nasopharyngeal(NP) swabs in vial transport medium  Result Value Ref Range Status   SARS Coronavirus 2 by RT PCR NEGATIVE NEGATIVE Final    Comment: (NOTE) SARS-CoV-2 target nucleic acids are NOT DETECTED.  The SARS-CoV-2 RNA is generally detectable in upper respiratory specimens during the acute phase of infection. The lowest concentration of SARS-CoV-2 viral copies this assay can detect is 138 copies/mL. A negative result does not preclude SARS-Cov-2 infection and should not be used as the sole basis for treatment or other patient management decisions. A negative result may occur with  improper specimen collection/handling, submission of specimen other than nasopharyngeal swab, presence of viral mutation(s) within the areas targeted by this assay, and inadequate number of viral copies(<138 copies/mL). A negative result must be combined with clinical observations, patient history, and epidemiological information. The expected result is Negative.  Fact Sheet for Patients:  EntrepreneurPulse.com.au  Fact Sheet for Healthcare Providers:  IncredibleEmployment.be  This test is no t yet approved or cleared by the [place] FDA and  has been authorized for detection and/or diagnosis of SARS-CoV-2 by FDA under an Emergency Use Authorization (EUA). This EUA will remain  in effect (meaning this test can be used) for the duration of the COVID-19 declaration under Section 564(b)(1) of the Act, 21 U.S.C.section 360bbb-3(b)(1), unless the authorization is terminated  or revoked sooner.       Influenza A by PCR NEGATIVE NEGATIVE Final   Influenza B by PCR NEGATIVE NEGATIVE Final    Comment: (NOTE) The Xpert Xpress SARS-CoV-2/FLU/RSV plus assay is intended as an aid in the diagnosis of influenza from Nasopharyngeal swab specimens and should not be used as a sole basis for treatment. Nasal washings and aspirates are unacceptable for Xpert Xpress  SARS-CoV-2/FLU/RSV testing.  Fact Sheet for Patients: EntrepreneurPulse.com.au  Fact Sheet for Healthcare Providers: IncredibleEmployment.be  This test is not yet approved or cleared by the [place] FDA and has been authorized for detection and/or diagnosis of SARS-CoV-2 by FDA under an Emergency Use Authorization (EUA). This EUA will remain in effect (meaning this test can be used) for the duration of the COVID-19 declaration under Section 564(b)(1) of the Act, 21 U.S.C. section 360bbb-3(b)(1), unless the authorization is terminated or revoked.  Performed at [facility], [place] [address], [place], [place] [address]   Urine Culture     Status: None  Collection Time: 12/05/21  9:00 AM   Specimen: Urine, Clean Catch  Result Value Ref Range Status   Specimen Description   Final    URINE, CLEAN CATCH Performed at [facility], [place] [address] [place], [place] [address]    Special Requests   Final    NONE Performed at [facility], [place] [address], [place], [place] [address]    Culture   Final    NO GROWTH Performed at [facility], [place] [address], [place], [place] [address]    Report Status 12/06/2021 FINAL  Final  Culture, blood (Routine X 2) w Reflex to ID Panel     Status: None   Collection Time: 12/05/21  4:43 PM   Specimen: BLOOD  Result Value Ref Range Status   Specimen Description   Final    BLOOD RIGHT ANTECUBITAL Performed at [place] [address] [place], [place] [address]    Special Requests   Final    BOTTLES DRAWN AEROBIC ONLY Blood Culture adequate volume Performed at [place] [address] [place], [place] [address]    Culture   Final    NO GROWTH 5 DAYS Performed at [facility], [place] [address], [place], [place] [address]    Report Status 12/10/2021 FINAL  Final  Culture, blood (Routine X 2) w  Reflex to ID Panel     Status: None   Collection Time: 12/05/21  4:50 PM   Specimen: BLOOD  Result Value Ref Range Status   Specimen Description   Final    BLOOD RIGHT ANTECUBITAL Performed at [place] [address], [place], [place] [address]    Special Requests   Final    BOTTLES DRAWN AEROBIC ONLY Blood Culture adequate volume Performed at [place] [address], [place], [place] [address]    Culture   Final    NO GROWTH 5 DAYS Performed at [facility], [place] [address], [place], [place] [address]    Report Status 12/10/2021 FINAL  Final  Surgical PCR screen     Status: None   Collection Time: 12/07/21  7:32 PM   Specimen: Nasal Mucosa; Nasal Swab  Result Value Ref Range Status   MRSA, PCR NEGATIVE NEGATIVE Final   Staphylococcus aureus NEGATIVE NEGATIVE Final    Comment: (NOTE) The Xpert SA Assay (FDA approved for NASAL specimens in patients 6 years of age and older), is one component of a comprehensive surveillance program. It is not intended to diagnose infection nor to guide or monitor treatment. Performed at [facility], [place] [address], [place], [place] [address]           Radiology Studies: DG Pelvis Portable  Result Date: 12/08/2021 CLINICAL DATA:  Status post left hip revision. EXAM: PORTABLE PELVIS 1-2 VIEWS COMPARISON:  Operative radiograph dated 12/08/2021. FINDINGS: Total left hip arthroplasty appears intact and in anatomic alignment. Prior arthroplasty of the right femoral head. No acute fracture or dislocation. The bones are osteopenic with degenerative changes of the lower lumbar spine. Postsurgical changes in the soft tissues of the left hip. IMPRESSION: Left hip arthroplasty appears intact and in anatomic alignment. Electronically Signed   By: Anner Crete M.D.   On: 12/08/2021 20:19   DG C-Arm 1-60 Min-No Report  Result Date: 12/08/2021 Fluoroscopy was utilized by the  requesting physician.  No radiographic interpretation.   DG C-Arm 1-60 Min-No Report  Result Date: 12/08/2021 Fluoroscopy was utilized by the requesting physician.  No radiographic interpretation.   DG C-Arm 1-60 Min-No Report  Result Date: 12/08/2021 Fluoroscopy was utilized by the requesting physician.  No radiographic interpretation.   DG C-Arm 1-60 Min-No Report  Result Date: 12/08/2021 Fluoroscopy was utilized by the requesting physician.  No radiographic interpretation.   DG HIP OPERATIVE UNILAT W OR W/O PELVIS LEFT  Result Date: 12/08/2021 CLINICAL DATA:  Revision of hip arthroplasty. EXAM: OPERATIVE LEFT HIP (WITH PELVIS IF PERFORMED) 2 VIEWS TECHNIQUE: Fluoroscopic spot image(s) were submitted for interpretation post-operatively. COMPARISON:  Left hip x-ray 12/02/2021. FINDINGS: Left hip total arthroplasty is present in anatomic alignment. Right hip arthroplasty is partially visualized. FLUOROSCOPY TIME:  21 seconds. IMPRESSION: 1. New left hip total arthroplasty in anatomic alignment. Electronically Signed   By: Ronney Asters M.D.   On: 12/08/2021 19:48        Scheduled Meds:  sodium chloride   Intravenous Once   aspirin  81 mg Oral BID   celecoxib  200 mg Oral BID   Continuous Infusions:  sodium chloride 30 mL/hr at 12/10/21 0434   methocarbamol (ROBAXIN) IV 500 mg (12/08/21 2034)     LOS: 8 days    Time spent: 35 minutes    Irine Seal, MD [facility]   To contact the attending provider between 7A-7P or the covering provider during after hours 7P-7A, please log into the web site [email or web address] and access using universal [place] password for that web site. If you do not have the password, please call the hospital operator.  12/10/2021, 10:31 AM

## 2021-12-10 NOTE — Plan of Care (Signed)
Plan of care reviewed and discussed with the patient. 

## 2021-12-11 LAB — CBC
HCT: 25.7 % — ABNORMAL LOW (ref 36.0–46.0)
Hemoglobin: 8.5 g/dL — ABNORMAL LOW (ref 12.0–15.0)
MCH: 28.1 pg (ref 26.0–34.0)
MCHC: 33.1 g/dL (ref 30.0–36.0)
MCV: 85.1 fL (ref 80.0–100.0)
Platelets: 177 10*3/uL (ref 150–400)
RBC: 3.02 MIL/uL — ABNORMAL LOW (ref 3.87–5.11)
RDW: 14 % (ref 11.5–15.5)
WBC: 8.5 10*3/uL (ref 4.0–10.5)
nRBC: 0 % (ref 0.0–0.2)

## 2021-12-11 LAB — BASIC METABOLIC PANEL
Anion gap: 4 — ABNORMAL LOW (ref 5–15)
BUN: 7 mg/dL — ABNORMAL LOW (ref 8–23)
CO2: 26 mmol/L (ref 22–32)
Calcium: 8.3 mg/dL — ABNORMAL LOW (ref 8.9–10.3)
Chloride: 106 mmol/L (ref 98–111)
Creatinine, Ser: 0.42 mg/dL — ABNORMAL LOW (ref 0.44–1.00)
GFR, Estimated: >60 mL/min (ref >60)
Glucose, Bld: 97 mg/dL (ref 70–99)
Potassium: 3.4 mmol/L — ABNORMAL LOW (ref 3.5–5.1)
Sodium: 136 mmol/L (ref 135–145)

## 2021-12-11 MED ORDER — POTASSIUM CHLORIDE CRYS ER 20 MEQ PO TBCR
40.0000 meq | EXTENDED_RELEASE_TABLET | Freq: Once | ORAL | Status: AC
  Administered 2021-12-11: 40 meq via ORAL
  Filled 2021-12-11: qty 2

## 2021-12-11 NOTE — Progress Notes (Signed)
Physical Therapy Treatment Patient Details Name: Isabel [name] MRN: [id number] DOB: 07-31-1956 Today's Date: 12/12/2021   History of Present Illness Pt s/p L THR revision from hip resurfacing.  Pt with hx of bil hip resurfacing and breast CA    PT Comments    Pt continues very motivated and up to ambulate in hall, negotiated 2 stairs, performed therex program and reviewed bed mobility.  Pt hopeful for dc tomorrow.  Recommendations for follow up therapy are one component of a multi-disciplinary discharge planning process, led by the attending physician.  Recommendations may be updated based on patient status, additional functional criteria and insurance authorization.  Follow Up Recommendations  Home health PT     Assistance Recommended at Discharge Frequent or constant Supervision/Assistance  Patient can return home with the following     Equipment Recommendations  Rolling walker (2 wheels);BSC/3in1;Wheelchair (measurements PT);Wheelchair cushion (measurements PT)    Recommendations for Other Services       Precautions / Restrictions Precautions Precautions: Posterior Hip Restrictions Weight Bearing Restrictions: Yes RLE Weight Bearing: Weight bearing as tolerated LLE Weight Bearing: Touchdown weight bearing     Mobility  Bed Mobility Overal bed mobility: Needs Assistance Bed Mobility: Supine to Sit     Supine to sit: Min guard Sit to supine: Min assist   General bed mobility comments: min assist for L LE into bed; pt self assisting L LE with gait belt to exit bed.  Increased time for sequence and adherence to THP    Transfers Overall transfer level: Needs assistance Equipment used: Rolling walker (2 wheels) Transfers: Sit to/from Stand Sit to Stand: Supervision           General transfer comment: cues for proper positioning of LLE and UEs    Ambulation/Gait Ambulation/Gait assistance: Min guard;Supervision Gait Distance (Feet): 65  Feet Assistive device: Rolling walker (2 wheels) Gait Pattern/deviations: Step-to pattern;Decreased step length - right;Decreased step length - left;Shuffle;Trunk flexed Gait velocity: decr     General Gait Details: Increased time with min cues for sequence, posture and position from RW; Good follow through with TDWB   Stairs Stairs: Yes Stairs assistance: Min assist Stair Management: No rails;Step to pattern;Backwards;With walker Number of Stairs: 4 General stair comments: 2 stairs twice with RW bkwd; cues for sequence and foot/RW placement   Wheelchair Mobility    Modified Rankin (Stroke Patients Only)       Balance Overall balance assessment: Needs assistance Sitting-balance support: No upper extremity supported;Feet supported Sitting balance-Leahy Scale: Good     Standing balance support: No upper extremity supported Standing balance-Leahy Scale: Fair Standing balance comment: Pt able to don mask with both hands in standing                            Cognition Arousal/Alertness: Awake/alert Behavior During Therapy: WFL for tasks assessed/performed Overall Cognitive Status: Within Functional Limits for tasks assessed                                          Exercises Total Joint Exercises Ankle Circles/Pumps: AROM;Both;15 reps;Supine Quad Sets: AROM;Both;10 reps;Supine Heel Slides: AAROM;Left;20 reps;Supine Hip ABduction/ADduction: AAROM;Left;15 reps;Supine Long Arc Quad: AAROM;AROM;Left;15 reps;Seated    General Comments        Pertinent Vitals/Pain Pain Assessment: 0-10 Pain Score: 3  Pain Location: L hip and knee Pain  Descriptors / Indicators: Sore Pain Intervention(s): Limited activity within patient's tolerance;Monitored during session;Premedicated before session;Ice applied    Home Living                          Prior Function            PT Goals (current goals can now be found in the care plan  section) Acute Rehab PT Goals Patient Stated Goal: Regain IND PT Goal Formulation: With patient Time For Goal Achievement: 12/23/21 Potential to Achieve Goals: Good Progress towards PT goals: Progressing toward goals    Frequency    7X/week      PT Plan Current plan remains appropriate    Co-evaluation              AM-PAC PT "6 Clicks" Mobility   Outcome Measure  Help needed turning from your back to your side while in a flat bed without using bedrails?: A Little Help needed moving from lying on your back to sitting on the side of a flat bed without using bedrails?: A Little Help needed moving to and from a bed to a chair (including a wheelchair)?: A Little Help needed standing up from a chair using your arms (e.g., wheelchair or bedside chair)?: A Little Help needed to walk in hospital room?: A Little Help needed climbing 3-5 steps with a railing? : A Lot 6 Click Score: 17    End of Session Equipment Utilized During Treatment: Gait belt Activity Tolerance: Patient tolerated treatment well Patient left: in chair;with call bell/phone within reach;with nursing/sitter in room;with family/visitor present Nurse Communication: Mobility status PT Visit Diagnosis: Difficulty in walking, not elsewhere classified (R26.2);Pain Pain - Right/Left: Left Pain - part of body: Hip     Time: 9767-3419 PT Time Calculation (min) (ACUTE ONLY): 47 min  Charges:  $Gait Training: 8-22 mins $Therapeutic Exercise: 8-22 mins $Therapeutic Activity: 8-22 mins                     Debe Coder PT Acute Rehabilitation Services Pager [phone] Office [phone]    Isabel [name] 12/12/2021, 12:34 PM

## 2021-12-11 NOTE — Progress Notes (Signed)
Physical Therapy Treatment Patient Details Name: Isabel [name] MRN: [id number] DOB: 02/20/1956 Today's Date: 12/11/2021   History of Present Illness Pt s/p L THR revision from hip resurfacing.  Pt with hx of bil hip resurfacing and breast CA    PT Comments    Pt continues motivated and progressing steadily with mobility.  Pt up to ambulate limited distance in hall, reviewed step up single step to gain access to shower and reviewed car transfers.     Patient suffers from L hip fx with TDWB only for up to 12 weeks which impairs their ability to perform daily activities like ambulating more than infrequent limited distance in the home or to access out of the home for Doctors appointment.  A walker alone will not resolve the issues with performing activities of daily living. A wheelchair will allow patient to safely perform daily activities.  The patient can self propel in the home or has a caregiver who can provide assistance.        Recommendations for follow up therapy are one component of a multi-disciplinary discharge planning process, led by the attending physician.  Recommendations may be updated based on patient status, additional functional criteria and insurance authorization.  Follow Up Recommendations  Home health PT     Assistance Recommended at Discharge Frequent or constant Supervision/Assistance  Patient can return home with the following     Equipment Recommendations  Rolling walker (2 wheels);BSC/3in1;Wheelchair (measurements PT);Wheelchair cushion (measurements PT)    Recommendations for Other Services       Precautions / Restrictions Precautions Precautions: Posterior Hip Precaution Booklet Issued: Yes (comment) Precaution Comments: Pt recalls all THP without cues. Restrictions Weight Bearing Restrictions: Yes RLE Weight Bearing: Weight bearing as tolerated LLE Weight Bearing: Touchdown weight bearing     Mobility  Bed Mobility                General bed mobility comments: Pt up in chair and requests back to same    Transfers Overall transfer level: Needs assistance Equipment used: Rolling walker (2 wheels) Transfers: Sit to/from Stand Sit to Stand: Min guard           General transfer comment: cues for proper positioning of LLE and UEs    Ambulation/Gait Ambulation/Gait assistance: Min guard Gait Distance (Feet): 50 Feet Assistive device: Rolling walker (2 wheels) Gait Pattern/deviations: Step-to pattern;Decreased step length - right;Decreased step length - left;Shuffle;Trunk flexed Gait velocity: decr     General Gait Details: Increased time with min cues for sequence, posture and position from RW; Good follow through with TDWB   Stairs Stairs: Yes Stairs assistance: Min assist Stair Management: No rails;Step to pattern;Backwards;With walker Number of Stairs: 2 General stair comments: single step twice bkwd with RW and cues for sequence   Wheelchair Mobility    Modified Rankin (Stroke Patients Only)       Balance Overall balance assessment: Needs assistance Sitting-balance support: No upper extremity supported;Feet supported Sitting balance-Leahy Scale: Good     Standing balance support: No upper extremity supported Standing balance-Leahy Scale: Fair                              Cognition Arousal/Alertness: Awake/alert Behavior During Therapy: WFL for tasks assessed/performed Overall Cognitive Status: Within Functional Limits for tasks assessed  Exercises      General Comments        Pertinent Vitals/Pain Pain Assessment: 0-10 Pain Score: 3  Faces Pain Scale: Hurts a little bit Pain Location: L hip and knee Pain Descriptors / Indicators: Sore Pain Intervention(s): Limited activity within patient's tolerance;Monitored during session;Ice applied    Home Living                          Prior Function             PT Goals (current goals can now be found in the care plan section) Acute Rehab PT Goals Patient Stated Goal: Regain IND PT Goal Formulation: With patient Time For Goal Achievement: 12/23/21 Potential to Achieve Goals: Good Progress towards PT goals: Progressing toward goals    Frequency    7X/week      PT Plan Current plan remains appropriate    Co-evaluation              AM-PAC PT "6 Clicks" Mobility   Outcome Measure  Help needed turning from your back to your side while in a flat bed without using bedrails?: A Little Help needed moving from lying on your back to sitting on the side of a flat bed without using bedrails?: A Little Help needed moving to and from a bed to a chair (including a wheelchair)?: A Little Help needed standing up from a chair using your arms (e.g., wheelchair or bedside chair)?: A Little Help needed to walk in hospital room?: A Little Help needed climbing 3-5 steps with a railing? : A Lot 6 Click Score: 17    End of Session Equipment Utilized During Treatment: Gait belt Activity Tolerance: Patient tolerated treatment well Patient left: in chair;with call bell/phone within reach;with nursing/sitter in room;with family/visitor present Nurse Communication: Mobility status PT Visit Diagnosis: Difficulty in walking, not elsewhere classified (R26.2);Pain Pain - Right/Left: Left Pain - part of body: Hip     Time: 1451-1520 PT Time Calculation (min) (ACUTE ONLY): 29 min  Charges:  $Gait Training: 8-22 mins $Therapeutic Activity: 8-22 mins                     [place] Pager [phone] Office [phone]    [place]k Minor 12/11/2021, 3:45 PM

## 2021-12-11 NOTE — Progress Notes (Signed)
PROGRESS NOTE    Isabel [name]  PFX:[id number] DOB: 02-03-1956 DOA: 12/02/2021 PCP: Caren Macadam, MD    Chief Complaint  Patient presents with   Hip Pain    Brief Narrative: Patient is a 66 year old female history of osteoarthritis with bilateral hip resurfacing and possible metal reaction, history of breast cancer presenting to the ED when walking felt a pop in the left hip with immediate pain and inability to bear weight.  Patient denies any fall.  No syncopal episode.  No chest pain.  Patient seen in the ED plain films and consistent with a left femoral neck fracture.  Patient admitted.  Orthopedics consulted for further evaluation.  Patient subsequently underwent hip repair 12/09/2021, seen by PT OT who are recommending SNF   Assessment & Plan:   Principal Problem:   Closed left hip fracture 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 -Plain films consistent with left femoral neck fracture. 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  Commuted left femoral neck fracture with a fracture cleft extending along the medial aspect into the proximal diaphysis. -Preop EKG with normal sinus rhythm. -Patient with no chest pain.  No history of coronary artery disease. -Patient did state had been having some  intermittent palpitations and was scheduled for outpatient 2D echo. -2D echo with a EF of 60-65%, NWMA.  -Patient seen in consultation by orthopedics and patient underwent revision of left hip resurfacing, conversion to total hip arthroplasty per Dr. Lyla Glassing 12/08/2020.  -It is noted by orthopedics note that they called [facility] at patient's request and spoke to Daisey Must who was unable to complete surgery in a more timely fashion as no orthopedics beds available. -PT/OT, TD WB AT left lower extremity with walker x12 weeks per orthopedic recommendations. -We will likely need SNF versus home health per PT. -Per patient states orthopedics feel she should be okay for home health therapy. -Home health therapies ordered. -Pain management, DVT prophylaxis per orthopedics. -Home when cleared by orthopedics. -Per orthopedics.  2.  Fall ruled out.  3.  Postop acute blood loss anemia/symptomatic anemia -Postoperatively patient noted to have a hemoglobin of 7.5 )12/09/2021) with no overt GI bleed. -Patient noted to be symptomatic as when working with PT blood pressure dropped into the 90s and patient with complaints of lightheadedness and dizziness and placed back in bed. -Anemia panel with iron level of 13, TIBC of 188, ferritin of 89, folate of 22.5 -Due to symptomatic anemia, significant drop in hemoglobin postoperatively patient transfused 2 units packed red blood cells (12/09/2021 ). -Hemoglobin stable at 8.5.   -Transfusion threshold hemoglobin  < 8.  -Follow H&H.  4.  Hyponatremia -Likely secondary to hypovolemic hyponatremia as urine sodium noted to be < 10.  Urinalysis with concerns for possible UTI.   -Improved with hydration.   -Follow.  5.  Fever/probable UTI -Patient with some complaints of suprapubic abdominal discomfort. -Urinalysis done with large leukocytes, nitrite negative, 6-10 WBCs. -Urine cultures with no growth to date.  -Patient noted to have a fever of 101.4 (12/05/2021). -Blood  cultures negative after 5 days -Chest x-ray with no acute infiltrate.  -Status post 3 days IV Rocephin.   -Afebrile. -No further antibiotics needed.   -Incentive spirometry, flutter valve.    6.  Hypokalemia -Potassium at 3.4. -KDur 40 mEq p.o. x1.   DVT prophylaxis: Aspirin per orthopedics. Code Status: Full Family Communication: Updated patient.  Updated husband at bedside.  Disposition:   Status is: Inpatient  Remains inpatient appropriate because: Severity of illness.       Consultants:  Orthopedics: Hilbert Odor, PA 12/03/2021/Dr. Swinteck  Procedures:  CT left hip 12/03/2021 Plain films of the left knee 12/03/2021 Chest x-ray 12/02/2021 Plain films of the left hip and pelvis 12/02/2021. 2D echo 12/04/2021 Transfusion 2 units packed red blood cells 12/09/2021 Revision left hip resurfacing, conversion to total hip arthroplasty per Dr. Lyla Glassing 12/08/2020    Antimicrobials:  IV Rocephin 12/05/2021>>> 12/07/2021   Subjective: Sitting up in chair.  Overall feeling better.  Denies any dizziness, no lightheadedness, no overt bleeding.  States having loose stools that are darker.  No chest pain.  No shortness of breath.  Complaining of some swelling in the upper thigh and tightness behind the left knee.  States able to work with physical therapy today.  States orthopedics was planning to get an MRI of her right hip.    Objective: Vitals:   12/10/21 0503 12/10/21 1529 12/10/21 2049 12/11/21 0457  BP: (!) 111/58 118/64 (!) 118/59 (!) 114/58  Pulse: 74 92 86 75  Resp: 17 18 18 16   Temp: 98.3 F (36.8 C) 98.9 F (37.2 C) 98.4 F (36.9 C) 98.1 F (36.7 C)  TempSrc: Oral Oral Oral   SpO2: 98% 100% 100% 99%  Weight:      Height:        Intake/Output Summary (Last 24 hours) at 12/11/2021 1115 Last data filed at 12/11/2021 0900 Gross per 24 hour  Intake 1000 ml  Output 1000 ml  Net 0 ml    Filed Weights   12/06/21 0626 12/07/21 0500 12/08/21 1319  Weight: 65.5 kg  65.3 kg 65.3 kg    Examination:  General exam: Pallor. Respiratory system: Lungs clear to auscultation bilaterally.  No wheezes, no crackles, no rhonchi.  Normal respiratory effort.  Speaking in full sentences. Cardiovascular system: RRR no murmurs rubs or gallops.  No JVD.  No lower extremity edema.  Gastrointestinal system: Abdomen is soft, nontender, nondistended, positive bowel sounds.  No rebound.  No guarding.   Central nervous system: Alert and oriented. No focal neurological deficits. Extremities: No clubbing cyanosis or edema.  Left hip with ice on it, decreased tenderness to palpation.  Left thigh with some swelling noted. Skin: No rashes, lesions or ulcers Psychiatry: Judgement and insight appear normal. Mood & affect appropriate.     Data Reviewed: I have personally reviewed following labs and imaging studies  CBC: Recent Labs  Lab 12/06/21 0333 12/07/21 0315 12/08/21 0839 12/09/21 0328 12/10/21 0332 12/10/21 1346 12/11/21 0326  WBC 4.9 5.3 4.6 8.2 8.2  --  8.5  NEUTROABS 3.2  --  2.7  --   --   --   --   HGB 12.4 12.5 13.6 7.5* 8.7* 9.8* 8.5*  HCT 37.4 38.2 40.0 21.7* 25.5* 29.2* 25.7*  MCV  84.0 84.0 81.3 82.5 84.4  --  85.1  PLT 206 220 240 171 161  --  177     Basic Metabolic Panel: Recent Labs  Lab 12/06/21 0333 12/07/21 0315 12/08/21 0839 12/09/21 0328 12/10/21 0332 12/11/21 0326  NA 134* 133* 135 133* 136 136  K 3.9 4.3 3.8 3.6 3.6 3.4*  CL 104 101 101 102 106 106  CO2 26 27 25 23 24 26   GLUCOSE 106* 103* 103* 215* 103* 97  BUN 7* 8 9 12 9  7*  CREATININE 0.41* 0.50 0.40* 0.58 0.45 0.42*  CALCIUM 8.5* 8.7* 9.2 8.2* 8.1* 8.3*  MG 1.9  --  2.2  --  2.0  --      GFR: Estimated Creatinine Clearance: 66.5 mL/min (A) (by C-G formula based on SCr of 0.42 mg/dL (L)).  Liver Function Tests: No results for input(s): AST, ALT, ALKPHOS, BILITOT, PROT, ALBUMIN in the last 168 hours.   CBG: No results for input(s): GLUCAP in the last 168  hours.   Recent Results (from the past 240 hour(s))  Resp Panel by RT-PCR (Flu A&B, Covid) Nasopharyngeal Swab     Status: None   Collection Time: 12/02/21  9:36 PM   Specimen: Nasopharyngeal Swab; Nasopharyngeal(NP) swabs in vial transport medium  Result Value Ref Range Status   SARS Coronavirus 2 by RT PCR NEGATIVE NEGATIVE Final    Comment: (NOTE) SARS-CoV-2 target nucleic acids are NOT DETECTED.  The SARS-CoV-2 RNA is generally detectable in upper respiratory specimens during the acute phase of infection. The lowest concentration of SARS-CoV-2 viral copies this assay can detect is 138 copies/mL. A negative result does not preclude SARS-Cov-2 infection and should not be used as the sole basis for treatment or other patient management decisions. A negative result may occur with  improper specimen collection/handling, submission of specimen other than nasopharyngeal swab, presence of viral mutation(s) within the areas targeted by this assay, and inadequate number of viral copies(<138 copies/mL). A negative result must be combined with clinical observations, patient history, and epidemiological information. The expected result is Negative.  Fact Sheet for Patients:  EntrepreneurPulse.com.au  Fact Sheet for Healthcare Providers:  IncredibleEmployment.be  This test is no t yet approved or cleared by the [place] FDA and  has been authorized for detection and/or diagnosis of SARS-CoV-2 by FDA under an Emergency Use Authorization (EUA). This EUA will remain  in effect (meaning this test can be used) for the duration of the COVID-19 declaration under Section 564(b)(1) of the Act, 21 U.S.C.section 360bbb-3(b)(1), unless the authorization is terminated  or revoked sooner.       Influenza A by PCR NEGATIVE NEGATIVE Final   Influenza B by PCR NEGATIVE NEGATIVE Final    Comment: (NOTE) The Xpert Xpress SARS-CoV-2/FLU/RSV plus assay is intended  as an aid in the diagnosis of influenza from Nasopharyngeal swab specimens and should not be used as a sole basis for treatment. Nasal washings and aspirates are unacceptable for Xpert Xpress SARS-CoV-2/FLU/RSV testing.  Fact Sheet for Patients: EntrepreneurPulse.com.au  Fact Sheet for Healthcare Providers: IncredibleEmployment.be  This test is not yet approved or cleared by the [place] FDA and has been authorized for detection and/or diagnosis of SARS-CoV-2 by FDA under an Emergency Use Authorization (EUA). This EUA will remain in effect (meaning this test can be used) for the duration of the COVID-19 declaration under Section 564(b)(1) of the Act, 21 U.S.C. section 360bbb-3(b)(1), unless the authorization is terminated or revoked.  Performed at [facility], [place] [address], [place], [place] [address]   Urine Culture     Status: None   Collection Time: 12/05/21  9:00 AM   Specimen: Urine, Clean Catch  Result Value Ref Range Status   Specimen Description   Final    URINE, CLEAN CATCH Performed at [facility], [place] [address], [place], [place] [address]    Special Requests   Final    NONE Performed at [facility], [place] [address], [place], [place] [address]    Culture   Final    NO GROWTH Performed at [facility], [place] [address], [place], [place] [address]    Report Status 12/06/2021 FINAL  Final  Culture, blood (Routine X 2) w Reflex to ID Panel     Status: None   Collection Time: 12/05/21  4:43 PM   Specimen: BLOOD  Result Value Ref Range Status   Specimen Description   Final    BLOOD RIGHT ANTECUBITAL Performed at [place] [address] [place], [place] [address]    Special Requests   Final    BOTTLES DRAWN AEROBIC ONLY Blood Culture adequate volume Performed at [place] [address], [place], [place]  [address]    Culture   Final    NO GROWTH 5 DAYS Performed at [facility], [place] [address] [place], [place] [address]    Report Status 12/10/2021 FINAL  Final  Culture, blood (Routine X 2) w Reflex to ID Panel     Status: None   Collection Time: 12/05/21  4:50 PM   Specimen: BLOOD  Result Value Ref Range Status   Specimen Description   Final    BLOOD RIGHT ANTECUBITAL Performed at [place] [address] [place], [place] [address]    Special Requests   Final    BOTTLES DRAWN AEROBIC ONLY Blood Culture adequate volume Performed at [place] [address] [place], [place] [address]    Culture   Final    NO GROWTH 5 DAYS Performed at [facility], [place] [address], [place], [place] [address]    Report Status 12/10/2021 FINAL  Final  Surgical PCR screen     Status: None   Collection Time: 12/07/21  7:32 PM   Specimen: Nasal Mucosa; Nasal Swab  Result Value Ref Range Status   MRSA, PCR NEGATIVE NEGATIVE Final   Staphylococcus aureus NEGATIVE NEGATIVE Final    Comment: (NOTE) The Xpert SA Assay (FDA approved for NASAL specimens in patients 78 years of age and older), is one component of a comprehensive surveillance program. It is not intended to diagnose infection nor to guide or monitor treatment. Performed at [facility], [place] [address], [place],  [address]           Radiology Studies: No results found.      Scheduled Meds:  sodium chloride   Intravenous Once   aspirin  81 mg Oral BID   celecoxib  200 mg Oral BID   Continuous Infusions:  methocarbamol (ROBAXIN) IV 500 mg (12/08/21 2034)     LOS: 9 days    Time spent: 35 minutes    Irine Seal, MD [facility]   To contact the attending provider between 7A-7P or the covering provider during after hours 7P-7A, please log into the web site [email or web address] and access using universal [place] password for that  web site. If  you do not have the password, please call the hospital operator.  12/11/2021, 11:15 AM

## 2021-12-11 NOTE — Plan of Care (Signed)
  Problem: Education: Goal: Knowledge of General Education information will improve Description Including pain rating scale, medication(s)/side effects and non-pharmacologic comfort measures Outcome: Progressing   

## 2021-12-11 NOTE — Progress Notes (Signed)
Physical Therapy Treatment Patient Details Name: Isabel [name] MRN: [id number] DOB: 03-04-1956 Today's Date: 12/11/2021   History of Present Illness Pt s/p L THR revision from hip resurfacing.  Pt with hx of bil hip resurfacing and breast CA    PT Comments    Pt continues motivated and progressing steadily with mobility including up to bathroom for toileting, ambulated increased distance in hall and with notable improvement in balance and decreased assist for most tasks.   Recommendations for follow up therapy are one component of a multi-disciplinary discharge planning process, led by the attending physician.  Recommendations may be updated based on patient status, additional functional criteria and insurance authorization.  Follow Up Recommendations  Home health PT     Assistance Recommended at Discharge Frequent or constant Supervision/Assistance  Patient can return home with the following     Equipment Recommendations  Rolling walker (2 wheels);BSC/3in1;Wheelchair (measurements PT);Wheelchair cushion (measurements PT)    Recommendations for Other Services       Precautions / Restrictions Precautions Precautions: Posterior Hip Precaution Comments: Pt recalls all THP without cues. Restrictions Weight Bearing Restrictions: Yes RLE Weight Bearing: Weight bearing as tolerated LLE Weight Bearing: Touchdown weight bearing     Mobility  Bed Mobility               General bed mobility comments: Pt up in chair and requests back to same    Transfers Overall transfer level: Needs assistance Equipment used: Rolling walker (2 wheels) Transfers: Sit to/from Stand Sit to Stand: Min assist           General transfer comment: cues for LE management and use of UEs to self assist    Ambulation/Gait Ambulation/Gait assistance: Min guard Gait Distance (Feet): 70 Feet (and additional 15' into bathroom) Assistive device: Rolling walker (2 wheels) Gait  Pattern/deviations: Step-to pattern;Decreased step length - right;Decreased step length - left;Shuffle;Trunk flexed Gait velocity: decr     General Gait Details: Increased time with cues for sequence, posture and position from RW;   Stairs             Wheelchair Mobility    Modified Rankin (Stroke Patients Only)       Balance Overall balance assessment: Needs assistance Sitting-balance support: No upper extremity supported;Feet supported Sitting balance-Leahy Scale: Good     Standing balance support: No upper extremity supported Standing balance-Leahy Scale: Fair Standing balance comment: Pt able to don mask with both hands in standing                            Cognition Arousal/Alertness: Awake/alert Behavior During Therapy: WFL for tasks assessed/performed Overall Cognitive Status: Within Functional Limits for tasks assessed                                          Exercises Total Joint Exercises Ankle Circles/Pumps: AROM;Both;15 reps;Supine Quad Sets: AROM;Both;10 reps;Supine Heel Slides: AAROM;Left;20 reps;Supine Hip ABduction/ADduction: AAROM;Left;15 reps;Supine    General Comments        Pertinent Vitals/Pain Pain Assessment: 0-10 Pain Score: 2  Pain Location: L hip and knee Pain Descriptors / Indicators: Sore Pain Intervention(s): Limited activity within patient's tolerance;Monitored during session;Premedicated before session    Home Living  Prior Function            PT Goals (current goals can now be found in the care plan section) Acute Rehab PT Goals Patient Stated Goal: Regain IND PT Goal Formulation: With patient Time For Goal Achievement: 12/23/21 Potential to Achieve Goals: Good Progress towards PT goals: Progressing toward goals    Frequency    7X/week      PT Plan Discharge plan needs to be updated    Co-evaluation              AM-PAC PT "6 Clicks"  Mobility   Outcome Measure  Help needed turning from your back to your side while in a flat bed without using bedrails?: A Little Help needed moving from lying on your back to sitting on the side of a flat bed without using bedrails?: A Little Help needed moving to and from a bed to a chair (including a wheelchair)?: A Little Help needed standing up from a chair using your arms (e.g., wheelchair or bedside chair)?: A Little Help needed to walk in hospital room?: A Little Help needed climbing 3-5 steps with a railing? : A Lot 6 Click Score: 17    End of Session Equipment Utilized During Treatment: Gait belt Activity Tolerance: Patient tolerated treatment well Patient left: in chair;with call bell/phone within reach;with nursing/sitter in room;with family/visitor present Nurse Communication: Mobility status PT Visit Diagnosis: Difficulty in walking, not elsewhere classified (R26.2);Pain Pain - Right/Left: Left Pain - part of body: Hip     Time: 6503-5465 PT Time Calculation (min) (ACUTE ONLY): 41 min  Charges:  $Gait Training: 8-22 mins $Therapeutic Exercise: 8-22 mins $Therapeutic Activity: 8-22 mins                     Isabel [name] PT Acute Rehabilitation Services Pager [phone] Office [phone]    Isabel [name] 12/11/2021, 10:36 AM

## 2021-12-11 NOTE — Progress Notes (Signed)
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  Pt with hx of bil hip resurfacing and breast CA   OT comments  Patient was able to carryover education from AE training during last session with SUP to don/doff socks seated in recliner with increased time. Patient was educated on donning pants with reacher while maintaining precautions. Patient was able to carry over education during session. Patient and husband were educated on shower chairs and difference between each one. Patient's discharge plan remains appropriate at this time. OT will continue to follow acutely.      Recommendations for follow up therapy are one component of a multi-disciplinary discharge planning process, led by the attending physician.  Recommendations may be updated based on patient status, additional functional criteria and insurance authorization.    Follow Up Recommendations  Home health OT    Assistance Recommended at Discharge Intermittent Supervision/Assistance  Patient can return home with the following  A little help with bathing/dressing/bathroom;Assistance with cooking/housework;A lot of help with walking and/or transfers;Help with stairs or ramp for entrance   Equipment Recommendations  BSC/3in1;Other (comment);Tub/shower seat;Tub/shower bench (total hip kit)    Recommendations for Other Services      Precautions / Restrictions Precautions Precautions: Posterior Hip Precaution Booklet Issued: Yes (comment) Precaution Comments: Pt recalls all THP without cues. Restrictions Weight Bearing Restrictions: Yes RLE Weight Bearing: Weight bearing as tolerated LLE Weight Bearing: Touchdown weight bearing       Mobility Bed Mobility               General bed mobility comments: Pt up in chair and requests back to same    Transfers Overall  transfer level: Needs assistance Equipment used: Rolling walker (2 wheels) Transfers: Sit to/from Stand Sit to Stand: Min guard           General transfer comment: cues for proper positioning of LLE and UEs     Balance Overall balance assessment: Needs assistance Sitting-balance support: No upper extremity supported;Feet supported Sitting balance-Leahy Scale: Good     Standing balance support: No upper extremity supported Standing balance-Leahy Scale: Fair Standing balance comment: Pt able to don mask with both hands in standing                           ADL either performed or assessed with clinical judgement   ADL Overall ADL's : Needs assistance/impaired                       Lower Body Dressing Details (indicate cue type and reason): patient donned pants with reacher with min guard in standing with education on pulling up pants with one hand at at time to maintain balance.     Toileting- Clothing Manipulation and Hygiene: Min guard Toileting - Clothing Manipulation Details (indicate cue type and reason): RW with continued cues to use one hand at a time to complete clothing management in standing.   Tub/Shower Transfer Details (indicate cue type and reason): patient and husband were educated on different shower seated with both shower chair and tub transfer bench brought into room. patient reported that shower has curtian entrance. patient was verbally eduacted and visual demonstated how to get in  and out of shower with both seated. patient was educated on speaking with [facility] services to find best set  up fro bathroom at home. patient and husband verbalized understanding.        Extremity/Trunk Assessment              Vision       Perception     Praxis      Cognition Arousal/Alertness: Awake/alert Behavior During Therapy: WFL for tasks assessed/performed Overall Cognitive Status: Within Functional Limits for tasks assessed                                             Exercises Exercises: Total Joint Total Joint Exercises Ankle Circles/Pumps: AROM;Both;15 reps;Supine Quad Sets: AROM;Both;10 reps;Supine Heel Slides: AAROM;Left;20 reps;Supine Hip ABduction/ADduction: AAROM;Left;15 reps;Supine   Shoulder Instructions       General Comments      Pertinent Vitals/ Pain       Pain Assessment: Faces Pain Score: 2  Faces Pain Scale: Hurts a little bit Pain Location: L hip and knee Pain Descriptors / Indicators: Sore Pain Intervention(s): Limited activity within patient's tolerance;Monitored during session;Premedicated before session  Home Living                                          Prior Functioning/Environment              Frequency  Min 2X/week        Progress Toward Goals  OT Goals(current goals can now be found in the care plan section)  Progress towards OT goals: Progressing toward goals     Plan Discharge plan remains appropriate    Co-evaluation                 AM-PAC OT "6 Clicks" Daily Activity     Outcome Measure   Help from another person eating meals?: None Help from another person taking care of personal grooming?: A Little Help from another person toileting, which includes using toliet, bedpan, or urinal?: A Little Help from another person bathing (including washing, rinsing, drying)?: A Little Help from another person to put on and taking off regular upper body clothing?: A Little Help from another person to put on and taking off regular lower body clothing?: A Little 6 Click Score: 19    End of Session Equipment Utilized During Treatment: Rolling walker (2 wheels);Gait belt  OT Visit Diagnosis: Other abnormalities of gait and mobility (R26.89);Muscle weakness (generalized) (M62.81);Pain   Activity Tolerance Patient tolerated treatment well   Patient Left in chair;with call bell/phone within reach;with family/visitor present   Nurse  Communication Mobility status        Time: 1209-1252 OT Time Calculation (min): 43 min  Charges: OT General Charges $OT Visit: 1 Visit OT Treatments $Self Care/Home Management : 38-52 mins  [name] OTR/L, MS Acute Rehabilitation Department Office# [phone] Pager# [phone]   Marcellina Millin 12/11/2021, 1:05 PM

## 2021-12-12 LAB — HEMOGLOBIN AND HEMATOCRIT, BLOOD
HCT: 24.8 % — ABNORMAL LOW (ref 36.0–46.0)
Hemoglobin: 8.1 g/dL — ABNORMAL LOW (ref 12.0–15.0)

## 2021-12-12 LAB — BASIC METABOLIC PANEL
Anion gap: 7 (ref 5–15)
BUN: 7 mg/dL — ABNORMAL LOW (ref 8–23)
CO2: 27 mmol/L (ref 22–32)
Calcium: 8.6 mg/dL — ABNORMAL LOW (ref 8.9–10.3)
Chloride: 106 mmol/L (ref 98–111)
Creatinine, Ser: 0.38 mg/dL — ABNORMAL LOW (ref 0.44–1.00)
GFR, Estimated: >60 mL/min (ref >60)
Glucose, Bld: 96 mg/dL (ref 70–99)
Potassium: 3.8 mmol/L (ref 3.5–5.1)
Sodium: 140 mmol/L (ref 135–145)

## 2021-12-12 MED ORDER — SODIUM CHLORIDE 0.9 % IV SOLN
250.0000 mg | Freq: Every day | INTRAVENOUS | Status: AC
  Administered 2021-12-12 – 2021-12-13 (×2): 250 mg via INTRAVENOUS
  Filled 2021-12-12 (×2): qty 20

## 2021-12-12 MED ORDER — SODIUM CHLORIDE 0.9 % IV SOLN: INTRAVENOUS | Status: DC | PRN

## 2021-12-12 NOTE — Progress Notes (Signed)
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  Pt with hx of bil hip resurfacing and breast CA    PT Comments    Pt continues very motivated and progressing steadily with mobility including increased stability with ambulation, excellent follow through with TDWB and decreasing level of assist for all tasks.   Recommendations for follow up therapy are one component of a multi-disciplinary discharge planning process, led by the attending physician.  Recommendations may be updated based on patient status, additional functional criteria and insurance authorization.  Follow Up Recommendations  Home health PT     Assistance Recommended at Discharge Frequent or constant Supervision/Assistance  Patient can return home with the following A little help with walking and/or transfers;A little help with bathing/dressing/bathroom;Assistance with cooking/housework;Assist for transportation;Help with stairs or ramp for entrance   Equipment Recommendations  Rolling walker (2 wheels);BSC/3in1;Wheelchair (measurements PT);Wheelchair cushion (measurements PT)    Recommendations for Other Services       Precautions / Restrictions Precautions Precautions: Posterior Hip Precaution Comments: Pt recalls all THP without cues. Restrictions RLE Weight Bearing: Weight bearing as tolerated LLE Weight Bearing: Touchdown weight bearing     Mobility  Bed Mobility Overal bed mobility: Needs Assistance Bed Mobility: Sit to Supine       Sit to supine: Min assist   General bed mobility comments: min assist for L LE management    Transfers Overall transfer level: Needs assistance Equipment used: Rolling walker (2 wheels) Transfers: Sit to/from Stand Sit to Stand: Supervision           General transfer comment: pt self-cues for proper positioning of  LLE and UEs    Ambulation/Gait Ambulation/Gait assistance: Min guard;Supervision Gait Distance (Feet): 65 Feet Assistive device: Rolling walker (2 wheels) Gait Pattern/deviations: Step-to pattern;Decreased step length - right;Decreased step length - left;Shuffle;Trunk flexed Gait velocity: decr     General Gait Details: Increased time with min cues for sequence, posture and position from RW; Good follow through with TDWB   Stairs Stairs: Yes Stairs assistance: Min assist Stair Management: No rails;Step to pattern;Backwards;With walker Number of Stairs: 3 General stair comments: single step and 2 step with min cues and min assist; spouse present   Wheelchair Mobility    Modified Rankin (Stroke Patients Only)       Balance Overall balance assessment: Needs assistance Sitting-balance support: No upper extremity supported;Feet supported Sitting balance-Leahy Scale: Good     Standing balance support: No upper extremity supported Standing balance-Leahy Scale: Fair Standing balance comment: Pt able to don mask with both hands in standing                            Cognition Arousal/Alertness: Awake/alert Behavior During Therapy: WFL for tasks assessed/performed Overall Cognitive Status: Within Functional Limits for tasks assessed                                          Exercises      General Comments        Pertinent Vitals/Pain Pain Assessment: 0-10 Pain Score: 3  Pain Location: L hip and knee Pain Descriptors / Indicators: Sore Pain Intervention(s): Limited activity within patient's tolerance    Home Living  Prior Function            PT Goals (current goals can now be found in the care plan section) Acute Rehab PT Goals Patient Stated Goal: Regain IND PT Goal Formulation: With patient Time For Goal Achievement: 12/23/21 Potential to Achieve Goals: Good Progress towards PT goals:  Progressing toward goals    Frequency    7X/week      PT Plan Current plan remains appropriate    Co-evaluation              AM-PAC PT "6 Clicks" Mobility   Outcome Measure  Help needed turning from your back to your side while in a flat bed without using bedrails?: A Little Help needed moving from lying on your back to sitting on the side of a flat bed without using bedrails?: A Little Help needed moving to and from a bed to a chair (including a wheelchair)?: A Little Help needed standing up from a chair using your arms (e.g., wheelchair or bedside chair)?: A Little Help needed to walk in hospital room?: A Little Help needed climbing 3-5 steps with a railing? : A Little 6 Click Score: 18    End of Session Equipment Utilized During Treatment: Gait belt Activity Tolerance: Patient tolerated treatment well Patient left: in bed;with call bell/phone within reach;with family/visitor present Nurse Communication: Mobility status PT Visit Diagnosis: Difficulty in walking, not elsewhere classified (R26.2);Pain Pain - Right/Left: Left Pain - part of body: Hip     Time: 6812-7517 PT Time Calculation (min) (ACUTE ONLY): 33 min  Charges:  $Gait Training: 8-22 mins $Therapeutic Activity: 8-22 mins                     Debe Coder PT Acute Rehabilitation Services Pager [phone] Office [phone]    Isabel [name] 12/12/2021, 4:19 PM

## 2021-12-12 NOTE — Progress Notes (Signed)
PROGRESS NOTE    Madeleine Fenn Alire  ELF:[id number] DOB: Nov 29, 1956 DOA: 12/02/2021 PCP: Caren Macadam, MD    Chief Complaint  Patient presents with   Hip Pain    Brief Narrative: Patient is a 66 year old female history of osteoarthritis with bilateral hip resurfacing and possible metal reaction, history of breast cancer presenting to the ED when walking felt a pop in the left hip with immediate pain and inability to bear weight.  Patient denies any fall.  No syncopal episode.  No chest pain.  Patient seen in the ED plain films and consistent with a left femoral neck fracture.  Patient admitted.  Orthopedics consulted for further evaluation.  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 -Plain films consistent with left femoral neck fracture. 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  Commuted left femoral neck fracture with a fracture cleft extending along the medial aspect into the proximal diaphysis. -Preop EKG with normal sinus rhythm. -Patient with no chest pain.  No history of coronary artery disease. -Patient did state had been having some  intermittent palpitations and was scheduled for outpatient 2D echo. -2D echo with a EF of 60-65%, NWMA.  -Patient seen in consultation by orthopedics and patient underwent revision of left hip resurfacing, conversion to total hip arthroplasty per Dr. Lyla Glassing 12/08/2020.  -It is noted by orthopedics note that they called [facility] at patient's request and spoke to Daisey Must who was unable to complete surgery in a more timely fashion as no orthopedics beds available. -PT/OT, TD WBAT left lower extremity with walker x12 weeks per orthopedic recommendations. -Will likely need SNF versus home health per PT. -Home health therapies ordered. -Pain management, DVT prophylaxis per orthopedics. -Home when cleared by orthopedics hopefully tomorrow. -Per orthopedics.  2.  Fall ruled out.  3.  Postop acute blood loss anemia/symptomatic anemia -Postoperatively patient noted to have a hemoglobin of 7.5 )12/09/2021) with no overt GI bleed. -Patient noted to be symptomatic as when working with PT blood pressure dropped into the 90s and patient with complaints of lightheadedness and dizziness and placed back in bed. -Anemia panel with iron level of 13, TIBC of 188, ferritin of 89, folate of 22.5 -Due to symptomatic anemia, significant drop in hemoglobin postoperatively patient transfused 2 units packed red blood cells (12/09/2021 ). -Hemoglobin stable at 8.1. -Give IV iron. -Transfusion threshold hemoglobin  < 8.  -Follow H&H.  4.  Hyponatremia -Likely secondary to hypovolemic hyponatremia as urine sodium noted to be < 10.  Urinalysis with concerns for possible UTI.   -Improved with hydration.   -Follow.  5.  Fever/probable UTI -Patient with some complaints of suprapubic abdominal discomfort early  on in the hospitalization which has since resolved. -Urinalysis done with large leukocytes, nitrite negative, 6-10 WBCs. -Urine cultures with no growth to date.  -Patient noted to have a fever of 101.4  (12/05/2021). -Blood cultures negative after 5 days -Chest x-ray with no acute infiltrate.  -Status post 3 days IV Rocephin.   -Afebrile. -No further antibiotics needed.   -Incentive spirometry, flutter valve.    6.  Hypokalemia -Repleted.   -Potassium at 3.8.    DVT prophylaxis: Aspirin per orthopedics. Code Status: Full Family Communication: Updated patient.  No family at bedside.  Disposition:   Status is: Inpatient  Remains inpatient appropriate because: Severity of illness.       Consultants:  Orthopedics: Hilbert Odor, PA 12/03/2021/Dr. Swinteck  Procedures:  CT left hip 12/03/2021 Plain films of the left knee 12/03/2021 Chest x-ray 12/02/2021 Plain films of the left hip and pelvis 12/02/2021. 2D echo 12/04/2021 Transfusion 2 units packed red blood cells 12/09/2021 Revision left hip resurfacing, conversion to total hip arthroplasty per Dr. Lyla Glassing 12/08/2020    Antimicrobials:  IV Rocephin 12/05/2021>>> 12/07/2021   Subjective: Sitting up in chair.  Overall feeling better.  No dizziness, lightheadedness no chest pain, no shortness of breath.  No bleeding.  No abdominal pain.  Has some concern over some redness noted in the left hip area.   Objective: Vitals:   12/11/21 0457 12/11/21 1326 12/11/21 2126 12/12/21 0512  BP: (!) 114/58 120/72 (!) 109/56 114/62  Pulse: 75 87 86 79  Resp: 16 16 18 17   Temp: 98.1 F (36.7 C) 98.4 F (36.9 C) 99.1 F (37.3 C) 98.5 F (36.9 C)  TempSrc:   Oral Oral  SpO2: 99% 100% 100% 100%  Weight:      Height:        Intake/Output Summary (Last 24 hours) at 12/12/2021 1039 Last data filed at 12/12/2021 0200 Gross per 24 hour  Intake 820 ml  Output --  Net 820 ml    Filed Weights   12/06/21 0626 12/07/21 0500 12/08/21 1319  Weight: 65.5 kg 65.3 kg 65.3 kg    Examination:  General exam: NAD. Respiratory system: CTA B.  No wheezes, no crackles, no rhonchi.  Normal respiratory effort.  Speaking in full sentences.    Cardiovascular system: Regular rate rhythm no murmurs rubs or gallops.  No JVD.  No lower extremity edema.  Gastrointestinal system: Abdomen is soft, nontender, nondistended, positive bowel sounds.  No rebound.  No guarding. Central nervous system: Alert and oriented. No focal neurological deficits. Extremities: No clubbing cyanosis or edema.  Left hip with some slight redness however no significant tenderness to palpation, left hip with ice on it, decreased tenderness to palpation.  Left thigh with some swelling noted. Skin: No rashes, lesions or ulcers Psychiatry: Judgement and insight appear normal. Mood & affect appropriate.     Data Reviewed: I have personally reviewed following labs and imaging studies  CBC: Recent Labs  Lab 12/06/21 0333 12/07/21 0315 12/08/21 0839 12/09/21 0328 12/10/21 0332 12/10/21 1346 12/11/21 0326 12/12/21 0315  WBC 4.9 5.3 4.6 8.2 8.2  --  8.5  --   NEUTROABS 3.2  --  2.7  --   --   --   --   --   HGB 12.4 12.5 13.6 7.5* 8.7* 9.8* 8.5* 8.1*  HCT 37.4 38.2 40.0 21.7* 25.5* 29.2* 25.7* 24.8*  MCV 84.0 84.0 81.3 82.5 84.4  --  85.1  --   PLT 206 220 240 171 161  --  177  --      Basic Metabolic Panel: Recent Labs  Lab 12/06/21 0333 12/07/21 0315 12/08/21 0839 12/09/21 0328 12/10/21 0332 12/11/21 0326 12/12/21 0315  NA 134*   < > 135 133* 136 136 140  K 3.9   < > 3.8 3.6 3.6 3.4* 3.8  CL 104   < > 101 102 106 106 106  CO2 26   < > 25 23 24 26 27   GLUCOSE 106*   < > 103* 215* 103* 97 96  BUN 7*   < > 9 12 9  7* 7*  CREATININE 0.41*   < > 0.40* 0.58 0.45 0.42* 0.38*  CALCIUM 8.5*   < > 9.2 8.2* 8.1* 8.3* 8.6*  MG 1.9  --  2.2  --  2.0  --   --    < > = values in this interval not displayed.     GFR: Estimated Creatinine Clearance: 66.5 mL/min (A) (by C-G formula based on SCr of 0.38 mg/dL (L)).  Liver Function Tests: No results for input(s): AST, ALT, ALKPHOS, BILITOT, PROT, ALBUMIN in the last 168 hours.   CBG: No results for  input(s): GLUCAP in the last 168 hours.   Recent Results (from the past 240 hour(s))  Resp Panel by RT-PCR (Flu A&B, Covid) Nasopharyngeal Swab     Status: None   Collection Time: 12/02/21  9:36 PM   Specimen: Nasopharyngeal Swab; Nasopharyngeal(NP) swabs in vial transport medium  Result Value Ref Range Status   SARS Coronavirus 2 by RT PCR NEGATIVE NEGATIVE Final    Comment: (NOTE) SARS-CoV-2 target nucleic acids are NOT DETECTED.  The SARS-CoV-2 RNA is generally detectable in upper respiratory specimens during the acute phase of infection. The lowest concentration of SARS-CoV-2 viral copies this assay can detect is 138 copies/mL. A negative result does not preclude SARS-Cov-2 infection and should not be used as the sole basis for treatment or other patient management decisions. A negative result may occur with  improper specimen collection/handling, submission of specimen other than nasopharyngeal swab, presence of viral mutation(s) within the areas targeted by this assay, and inadequate number of viral copies(<138 copies/mL). A negative result must be combined with clinical observations, patient history, and epidemiological information. The expected result is Negative.  Fact Sheet for Patients:  EntrepreneurPulse.com.au  Fact Sheet for Healthcare Providers:  IncredibleEmployment.be  This test is no t yet approved or cleared by the [place] FDA and  has been authorized for detection and/or diagnosis of SARS-CoV-2 by FDA under an Emergency Use Authorization (EUA). This EUA will remain  in effect (meaning this test can be used) for the duration of the COVID-19 declaration under Section 564(b)(1) of the Act, 21 U.S.C.section 360bbb-3(b)(1), unless the authorization is terminated  or revoked sooner.       Influenza A by PCR NEGATIVE NEGATIVE Final   Influenza B by PCR NEGATIVE NEGATIVE Final    Comment: (NOTE) The Xpert Xpress  SARS-CoV-2/FLU/RSV plus assay is intended as an aid in the diagnosis of influenza from Nasopharyngeal swab specimens and should not be used as a sole basis for treatment. Nasal washings and aspirates are unacceptable for Xpert Xpress SARS-CoV-2/FLU/RSV testing.  Fact Sheet for Patients: EntrepreneurPulse.com.au  Fact Sheet for Healthcare Providers: IncredibleEmployment.be  This test is not yet approved or cleared by the [place] FDA and has been authorized for detection and/or diagnosis of SARS-CoV-2 by FDA under an Emergency Use Authorization (EUA). This EUA will remain in effect (meaning this test  can be used) for the duration of the COVID-19 declaration under Section 564(b)(1) of the Act, 21 U.S.C. section 360bbb-3(b)(1), unless the authorization is terminated or revoked.  Performed at [facility], [place] [address] [place], [place] [address]   Urine Culture     Status: None   Collection Time: 12/05/21  9:00 AM   Specimen: Urine, Clean Catch  Result Value Ref Range Status   Specimen Description   Final    URINE, CLEAN CATCH Performed at [facility], [place] [address], [place], [place] [address]    Special Requests   Final    NONE Performed at [facility], [place] [address] [place], [place] [address]    Culture   Final    NO GROWTH Performed at [facility], [place] [address] [place], [place] [address]    Report Status 12/06/2021 FINAL  Final  Culture, blood (Routine X 2) w Reflex to ID Panel     Status: None   Collection Time: 12/05/21  4:43 PM   Specimen: BLOOD  Result Value Ref Range Status   Specimen Description   Final    BLOOD RIGHT ANTECUBITAL Performed at [place] [address] [place], [place] [address]    Special Requests   Final    BOTTLES DRAWN AEROBIC ONLY Blood Culture adequate volume Performed at [place] [address], [place], [place] [address]    Culture   Final    NO GROWTH 5 DAYS Performed at [facility], [place] [address] [place], [place] [address]    Report Status 12/10/2021 FINAL  Final  Culture, blood (Routine X 2) w Reflex to ID Panel     Status: None   Collection Time: 12/05/21  4:50 PM   Specimen: BLOOD  Result Value Ref Range Status   Specimen Description   Final    BLOOD RIGHT ANTECUBITAL Performed at [place] [address] [place], [place] [address]    Special Requests   Final    BOTTLES DRAWN AEROBIC ONLY Blood Culture adequate volume Performed at [place] [address] [place], [place] [address]    Culture   Final    NO GROWTH 5 DAYS Performed at [facility], [place] [address], [place], [place] [address]    Report Status 12/10/2021 FINAL  Final  Surgical PCR screen     Status: None   Collection Time: 12/07/21  7:32 PM   Specimen: Nasal Mucosa; Nasal Swab  Result Value Ref Range Status   MRSA, PCR NEGATIVE NEGATIVE Final   Staphylococcus aureus NEGATIVE NEGATIVE Final    Comment: (NOTE) The Xpert SA Assay (FDA approved for NASAL specimens in patients 84 years of age and older), is one component of a comprehensive surveillance program. It is not intended to diagnose infection nor to guide or monitor treatment. Performed at [facility], [place] [address], [place],  [address]           Radiology Studies: No results found.      Scheduled Meds:  sodium chloride   Intravenous Once   aspirin  81 mg Oral BID   celecoxib  200 mg Oral BID   Continuous Infusions:  ferric gluconate (FERRLECIT) IVPB     methocarbamol (ROBAXIN) IV 500 mg (12/08/21 2034)     LOS: 10 days    Time spent: 35 minutes    Irine Seal,  MD [facility]   To contact the attending provider between 7A-7P or the covering provider during after hours 7P-7A, please log into the  web site [email or web address] and access using universal [place] password for that web site. If you do not have the password, please call the hospital operator.  12/12/2021, 10:39 AM

## 2021-12-12 NOTE — Progress Notes (Signed)
OT Cancellation Note  Patient Details Name: Isabel [name] MRN: [id number] DOB: 06/24/1956   Cancelled Treatment:    Reason Eval/Treat Not Completed: Other (comment) Patient back to bed with PT just prior to arrival. Patient reported she did not want further AE training at this time. Patient reported she thinks she has it under control. OT to continue to follow and check back as able.  [name] OTR/L, [place] Acute Rehabilitation Department Office# [phone] Pager# [phone]   12/12/2021, 3:28 PM

## 2021-12-12 NOTE — Plan of Care (Signed)
  Problem: Coping: Goal: Level of anxiety will decrease Outcome: Progressing   Problem: Pain Managment: Goal: General experience of comfort will improve Outcome: Progressing   

## 2021-12-12 NOTE — Plan of Care (Signed)
  Problem: Education: Goal: Knowledge of General Education information will improve Description Including pain rating scale, medication(s)/side effects and non-pharmacologic comfort measures Outcome: Progressing   

## 2021-12-12 NOTE — TOC Progression Note (Addendum)
Transition of Care [facility]) - Progression Note    Patient Details  Name: Isabel [name] MRN: [id number] Date of Birth: 1955-12-14  Transition of [facility]) CM/SW Contact  Ross Ludwig, [place] Phone Number: 12/12/2021, 10:52 AM  Clinical Narrative:     CSW spoke to [employer], per rep, the 3 in 1, rolling walker, and hospital bed should be delivered tomorrow.  CSW updated attending physician.  Still unable to find [facility] agency, will continue looking for [facility] agencies.   Expected Discharge Plan: [place] Barriers to Discharge: Continued Medical Work up, No [place] will accept this patient  Expected Discharge Plan and Services Expected Discharge Plan: [place] In-house Referral: Clinical Social Work   Post Acute Care Choice: Durable Medical Equipment, Home Health Living arrangements for the past 2 months: [place]                 DME Arranged: Hospital bed, 3-N-1, Walker rolling DME Agency: [employer] Date DME Agency Contacted: 12/10/21   Representative spoke with at DME Agency: [place]) Interventions    Readmission Risk Interventions No flowsheet data found.

## 2021-12-13 ENCOUNTER — Other Ambulatory Visit (HOSPITAL_COMMUNITY): Payer: Self-pay

## 2021-12-13 ENCOUNTER — Inpatient Hospital Stay (HOSPITAL_COMMUNITY): Payer: 59

## 2021-12-13 ENCOUNTER — Encounter (HOSPITAL_COMMUNITY): Payer: Self-pay | Admitting: Orthopedic Surgery

## 2021-12-13 DIAGNOSIS — R509 Fever, unspecified: Secondary | ICD-10-CM

## 2021-12-13 DIAGNOSIS — S72002P Fracture of unspecified part of neck of left femur, subsequent encounter for closed fracture with malunion: Secondary | ICD-10-CM

## 2021-12-13 DIAGNOSIS — D62 Acute posthemorrhagic anemia: Secondary | ICD-10-CM

## 2021-12-13 DIAGNOSIS — N390 Urinary tract infection, site not specified: Secondary | ICD-10-CM

## 2021-12-13 DIAGNOSIS — M9702XD Periprosthetic fracture around internal prosthetic left hip joint, subsequent encounter: Secondary | ICD-10-CM

## 2021-12-13 LAB — BASIC METABOLIC PANEL
Anion gap: 7 (ref 5–15)
BUN: 7 mg/dL — ABNORMAL LOW (ref 8–23)
CO2: 27 mmol/L (ref 22–32)
Calcium: 8.4 mg/dL — ABNORMAL LOW (ref 8.9–10.3)
Chloride: 107 mmol/L (ref 98–111)
Creatinine, Ser: 0.43 mg/dL — ABNORMAL LOW (ref 0.44–1.00)
GFR, Estimated: >60 mL/min (ref >60)
Glucose, Bld: 99 mg/dL (ref 70–99)
Potassium: 3.5 mmol/L (ref 3.5–5.1)
Sodium: 141 mmol/L (ref 135–145)

## 2021-12-13 LAB — CBC
HCT: 24.4 % — ABNORMAL LOW (ref 36.0–46.0)
Hemoglobin: 8 g/dL — ABNORMAL LOW (ref 12.0–15.0)
MCH: 28.6 pg (ref 26.0–34.0)
MCHC: 32.8 g/dL (ref 30.0–36.0)
MCV: 87.1 fL (ref 80.0–100.0)
Platelets: 267 10*3/uL (ref 150–400)
RBC: 2.8 MIL/uL — ABNORMAL LOW (ref 3.87–5.11)
RDW: 14.6 % (ref 11.5–15.5)
WBC: 5.6 10*3/uL (ref 4.0–10.5)
nRBC: 0 % (ref 0.0–0.2)

## 2021-12-13 MED ORDER — POTASSIUM CHLORIDE CRYS ER 20 MEQ PO TBCR
40.0000 meq | EXTENDED_RELEASE_TABLET | Freq: Once | ORAL | Status: AC
Start: 2021-12-13 — End: 2021-12-13
  Administered 2021-12-13: 40 meq via ORAL
  Filled 2021-12-13: qty 2

## 2021-12-13 MED ORDER — POLYETHYLENE GLYCOL 3350 17 G PO PACK: 17.0000 g | PACK | Freq: Every day | ORAL | 0 refills | Status: DC | PRN

## 2021-12-13 NOTE — Plan of Care (Signed)
  Problem: Coping: Goal: Level of anxiety will decrease Outcome: Progressing   Problem: Pain Managment: Goal: General experience of comfort will improve Outcome: Progressing   Problem: Safety: Goal: Ability to remain free from injury will improve Outcome: Progressing   

## 2021-12-13 NOTE — Care Management (Signed)
Patient suffers from closed left hip fracture, which impairs their ability to perform daily activities like bathing and dressing in the home. A walker will not resolve issue with performing activities of daily living. A wheelchair will allow patient to safely perform daily activities. Patient is not able to propel themselves in the home using a standard weight wheelchair due to hip fracture and endurance issues. Patient can self-propel in the lightweight wheelchair.

## 2021-12-13 NOTE — Progress Notes (Signed)
Physical Therapy Treatment Patient Details Name: Isabel [name] MRN: [id number] DOB: Sep 19, 1956 Today's Date: 12/13/2021   History of Present Illness Pt s/p L THR revision from hip resurfacing.  Pt with hx of bil hip resurfacing and breast CA    PT Comments    Pt progressing well, meeting goals. Ready for d/c from PT standpoint   Recommendations for follow up therapy are one component of a multi-disciplinary discharge planning process, led by the attending physician.  Recommendations may be updated based on patient status, additional functional criteria and insurance authorization.  Follow Up Recommendations  Other (comment) ([place] unavailable)     Assistance Recommended at Discharge Intermittent Supervision/Assistance  Patient can return home with the following A little help with walking and/or transfers;A little help with bathing/dressing/bathroom;Assistance with cooking/housework;Assist for transportation;Help with stairs or ramp for entrance   Equipment Recommendations  Rolling walker (2 wheels);BSC/3in1;Wheelchair (measurements PT);Wheelchair cushion (measurements PT)    Recommendations for Other Services       Precautions / Restrictions Precautions Precautions: Posterior Hip Precaution Comments: Pt recalls all THP without cues, reviewed durign functional mobility Restrictions Weight Bearing Restrictions: Yes LLE Weight Bearing: Touchdown weight bearing     Mobility  Bed Mobility Overal bed mobility: Needs Assistance Bed Mobility: Sit to Supine       Sit to supine: Min assist   General bed mobility comments: min/guard, using leg lifter to self assist    Transfers Overall transfer level: Needs assistance Equipment used: Rolling walker (2 wheels) Transfers: Sit to/from Stand Sit to Stand: Supervision;Modified independent (Device/Increase time)           General transfer comment: pt self-cues for proper positioning of LLE and UEs     Ambulation/Gait Ambulation/Gait assistance: Supervision;Modified independent (Device/Increase time) Gait Distance (Feet): 80 Feet (10' more) Assistive device: Rolling walker (2 wheels) Gait Pattern/deviations: Step-to pattern       General Gait Details: pt self cues correct technique, maintains TDWB   Stairs Stairs:  (planning to be bumped upstairs in w/c by family)           Wheelchair Mobility    Modified Rankin (Stroke Patients Only)       Balance                                            Cognition Arousal/Alertness: Awake/alert Behavior During Therapy: WFL for tasks assessed/performed Overall Cognitive Status: Within Functional Limits for tasks assessed                                          Exercises Total Joint Exercises Ankle Circles/Pumps: AROM;Both;15 reps;Supine Quad Sets: AROM;Both;10 reps;Supine Heel Slides: AAROM;Left;Supine;15 reps Hip ABduction/ADduction: AAROM;Left;15 reps;Supine Long Arc Quad: AAROM;AROM;Left;15 reps;Seated    General Comments        Pertinent Vitals/Pain Pain Assessment: 0-10 Pain Score: 2  Pain Location: L hip and knee Pain Descriptors / Indicators: Sore Pain Intervention(s): Limited activity within patient's tolerance;Monitored during session;Repositioned;Ice applied    Home Living                          Prior Function            PT Goals (current goals can now be found in the care  plan section) Acute Rehab PT Goals Patient Stated Goal: Regain IND PT Goal Formulation: With patient Time For Goal Achievement: 12/23/21 Potential to Achieve Goals: Good Progress towards PT goals: Progressing toward goals    Frequency    7X/week      PT Plan Current plan remains appropriate    Co-evaluation              AM-PAC PT "6 Clicks" Mobility   Outcome Measure  Help needed turning from your back to your side while in a flat bed without using bedrails?:  A Little Help needed moving from lying on your back to sitting on the side of a flat bed without using bedrails?: A Little Help needed moving to and from a bed to a chair (including a wheelchair)?: A Little Help needed standing up from a chair using your arms (e.g., wheelchair or bedside chair)?: A Little Help needed to walk in hospital room?: A Little Help needed climbing 3-5 steps with a railing? : A Little 6 Click Score: 18    End of Session Equipment Utilized During Treatment: Gait belt Activity Tolerance: Patient tolerated treatment well Patient left: in bed;with call bell/phone within reach;with bed alarm set Nurse Communication: Mobility status PT Visit Diagnosis: Difficulty in walking, not elsewhere classified (R26.2);Pain Pain - Right/Left: Left Pain - part of body: Hip     Time: 2683-4196 PT Time Calculation (min) (ACUTE ONLY): 45 min  Charges:  $Gait Training: 23-37 mins $Therapeutic Exercise: 8-22 mins                     Baxter Flattery, PT  Acute Rehab Dept ([place]) [phone] Pager [phone]  12/13/2021    [facility] 12/13/2021, 12:55 PM

## 2021-12-13 NOTE — Progress Notes (Signed)
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  Pt with hx of bil hip resurfacing and breast CA   OT comments  Patient was able to complete LB dressing tasks with reacher for pants and undergarments with SUP and one safety cue. Patient completed toileting with distant SUP with RW. Patient plans to d/c home this afternoon. Reported husband ordered total hip kit for her at home. Patient's discharge plan remains appropriate at this time. OT will continue to follow acutely.     Recommendations for follow up therapy are one component of a multi-disciplinary discharge planning process, led by the attending physician.  Recommendations may be updated based on patient status, additional functional criteria and insurance authorization.    Follow Up Recommendations  Home health OT    Assistance Recommended at Discharge Intermittent Supervision/Assistance  Patient can return home with the following  A little help with bathing/dressing/bathroom;Assistance with cooking/housework;A lot of help with walking and/or transfers;Help with stairs or ramp for entrance   Equipment Recommendations  BSC/3in1;Other (comment);Tub/shower seat;Tub/shower bench    Recommendations for Other Services      Precautions / Restrictions Precautions Precautions: Posterior Hip Precaution Booklet Issued: Yes (comment) Precaution Comments: Pt recalls all THP without cues Restrictions RLE Weight Bearing: Weight bearing as tolerated LLE Weight Bearing: Touchdown weight bearing       Mobility Bed Mobility Overal bed mobility: Needs Assistance Bed Mobility: Sit to Supine     Supine to sit: Min guard     General bed mobility comments: with leg lifter    Transfers                         Balance                                            ADL either performed or assessed with clinical judgement   ADL Overall ADL's : Needs assistance/impaired                     Lower Body Dressing: Supervision/safety Lower Body Dressing Details (indicate cue type and reason): with AE was able to complete with carryover from last session Toilet Transfer: Supervision/safety;Ambulation;BSC/3in1 Armed forces technical officer Details (indicate cue type and reason): with good carryover of education from previous session Toileting- Clothing Manipulation and Hygiene: Min guard Toileting - Clothing Manipulation Details (indicate cue type and reason): ed on using on eUE at a time to maintain balance            Extremity/Trunk Assessment              Vision       Perception     Praxis      Cognition Arousal/Alertness: Awake/alert Behavior During Therapy: WFL for tasks assessed/performed Overall Cognitive Status: Within Functional Limits for tasks assessed                                            Exercises     Shoulder Instructions       General Comments      Pertinent Vitals/ Pain       Pain  Assessment: Faces Faces Pain Scale: Hurts a little bit Pain Location: L hip and knee Pain Descriptors / Indicators: Sore Pain Intervention(s): Monitored during session  Home Living                                          Prior Functioning/Environment              Frequency  Min 2X/week        Progress Toward Goals  OT Goals(current goals can now be found in the care plan section)  Progress towards OT goals: Progressing toward goals     Plan Discharge plan remains appropriate    Co-evaluation                 AM-PAC OT "6 Clicks" Daily Activity     Outcome Measure   Help from another person eating meals?: None Help from another person taking care of personal grooming?: A Little Help from another person toileting, which includes using toliet, bedpan, or urinal?:  A Little Help from another person bathing (including washing, rinsing, drying)?: A Little Help from another person to put on and taking off regular upper body clothing?: A Little Help from another person to put on and taking off regular lower body clothing?: A Little 6 Click Score: 19    End of Session Equipment Utilized During Treatment: Rolling walker (2 wheels);Gait belt  OT Visit Diagnosis: Other abnormalities of gait and mobility (R26.89);Muscle weakness (generalized) (M62.81);Pain   Activity Tolerance Patient tolerated treatment well   Patient Left in bed;with call bell/phone within reach   Nurse Communication Mobility status        Time: 1333-1401 OT Time Calculation (min): 28 min  Charges: OT General Charges $OT Visit: 1 Visit OT Treatments $Self Care/Home Management : 23-37 mins  [name] OTR/L, MS Acute Rehabilitation Department Office# [phone] Pager# [phone]   Marcellina Millin 12/13/2021, 4:05 PM

## 2021-12-13 NOTE — Discharge Instructions (Signed)
Dr. Rod Can Joint Replacement Specialist [facility] [address], [place], [place] [address] [phone]   TOTAL HIP REPLACEMENT POSTOPERATIVE DIRECTIONS    Hip Rehabilitation, Guidelines Following Surgery   WEIGHT BEARING Partial weight bearing with assist device as directed.  TDWB LLE with walker. Posterior hip precautions.  The results of a hip operation are greatly improved after range of motion and muscle strengthening exercises. Follow all safety measures which are given to protect your hip. If any of these exercises cause increased pain or swelling in your joint, decrease the amount until you are comfortable again. Then slowly increase the exercises. Call your caregiver if you have problems or questions.   HOME CARE INSTRUCTIONS  Most of the following instructions are designed to prevent the dislocation of your new hip.  Remove items at home which could result in a fall. This includes throw rugs or furniture in walking pathways.  Continue medications as instructed at time of discharge. You may have some home medications which will be placed on hold until you complete the course of blood thinner medication. You may start showering once you are discharged home. Do not remove your dressing. Do not put on socks or shoes without following the instructions of your caregivers.   Sit on chairs with arms. Use the chair arms to help push yourself up when arising.  Arrange for the use of a toilet seat elevator so you are not sitting low.  Walk with walker as instructed.  You may resume a sexual relationship in one month or when given the OK by your caregiver.  Use walker as long as suggested by your caregivers.  You may put full weight on your legs and walk as much as is comfortable. Avoid periods of inactivity such as sitting longer than an hour when not asleep. This helps prevent blood clots.  You may return to work once you are cleared by Pharmacologist.  Do not drive a car for 6 weeks or until released by your surgeon.  Do not drive while taking narcotics.  Wear elastic stockings for two weeks following surgery during the day but you may remove then at night.  Make sure you keep all of your appointments after your operation with all of your doctors and caregivers. You should call the office at the above phone number and make an appointment for approximately two weeks after the date of your surgery. Please pick up a stool softener and laxative for home use as long as you are requiring pain medications. ICE to the affected hip every three hours for 30 minutes at a time and then as needed for pain and swelling. Continue to use ice on the hip for pain and swelling from surgery. You may notice swelling that will progress down to the foot and ankle.  This is normal after surgery.  Elevate the leg when you are not up walking on it.   It is important for you to complete the blood thinner medication as prescribed by your doctor. Continue to use the breathing machine which will help keep your temperature down.  It is common for your temperature to cycle up and down following surgery, especially at night when you are not up moving around and exerting yourself.  The breathing machine keeps your lungs expanded and your temperature down.  RANGE OF MOTION AND STRENGTHENING EXERCISES  These exercises are designed to help you keep full movement of your hip joint. Follow your caregiver's or physical  therapist's instructions. Perform all exercises about fifteen times, three times per day or as directed. Exercise both hips, even if you have had only one joint replacement. These exercises can be done on a training (exercise) mat, on the floor, on a table or on a bed. Use whatever works the best and is most comfortable for you. Use music or television while you are exercising so that the exercises are a pleasant break in your day. This will make your life better with  the exercises acting as a break in routine you can look forward to.  Lying on your back, slowly slide your foot toward your buttocks, raising your knee up off the floor. Then slowly slide your foot back down until your leg is straight again.  Lying on your back spread your legs as far apart as you can without causing discomfort.  Lying on your side, raise your upper leg and foot straight up from the floor as far as is comfortable. Slowly lower the leg and repeat.  Lying on your back, tighten up the muscle in the front of your thigh (quadriceps muscles). You can do this by keeping your leg straight and trying to raise your heel off the floor. This helps strengthen the largest muscle supporting your knee.  Lying on your back, tighten up the muscles of your buttocks both with the legs straight and with the knee bent at a comfortable angle while keeping your heel on the floor.   SKILLED REHAB INSTRUCTIONS: If the patient is transferred to a skilled rehab facility following release from the hospital, a list of the current medications will be sent to the facility for the patient to continue.  When discharged from the skilled rehab facility, please have the facility set up the patient's [place] prior to being released. Also, the skilled facility will be responsible for providing the patient with their medications at time of release from the facility to include their pain medication and their blood thinner medication. If the patient is still at the rehab facility at time of the two week follow up appointment, the skilled rehab facility will also need to assist the patient in arranging follow up appointment in our office and any transportation needs.  POST-OPERATIVE OPIOID TAPER INSTRUCTIONS: It is important to wean off of your opioid medication as soon as possible. If you do not need pain medication after your surgery it is ok to stop day one. Opioids include: Codeine, Hydrocodone(Norco,  Vicodin), Oxycodone(Percocet, oxycontin) and hydromorphone amongst others.  Long term and even short term use of opiods can cause: Increased pain response Dependence Constipation Depression Respiratory depression And more.  Withdrawal symptoms can include Flu like symptoms Nausea, vomiting And more Techniques to manage these symptoms Hydrate well Eat regular healthy meals Stay active Use relaxation techniques(deep breathing, meditating, yoga) Do Not substitute Alcohol to help with tapering If you have been on opioids for less than two weeks and do not have pain than it is ok to stop all together.  Plan to wean off of opioids This plan should start within one week post op of your joint replacement. Maintain the same interval or time between taking each dose and first decrease the dose.  Cut the total daily intake of opioids by one tablet each day Next start to increase the time between doses. The last dose that should be eliminated is the evening dose.    MAKE SURE YOU:  Understand these instructions.  Will watch your condition.  Will get help right away if you are not doing well or get worse.  Pick up stool softner and laxative for home use following surgery while on pain medications. Do not remove your dressing. The dressing is waterproof--it is OK to take showers. Continue to use ice for pain and swelling after surgery. Do not use any lotions or creams on the incision until instructed by your surgeon. TOUCH DOWN WEIGHT BEARING LEFT LEG WITH WALKER. POSTERIOR HIP PRECAUTIONS. Total Hip Protocol.

## 2021-12-13 NOTE — Plan of Care (Signed)
  Problem: Pain Managment: Goal: General experience of comfort will improve Outcome: Progressing   Problem: Safety: Goal: Ability to remain free from injury will improve Outcome: Progressing   

## 2021-12-13 NOTE — Progress Notes (Signed)
Discharge package printed and instructions given to patient. Pt verbalizes understanding. 

## 2021-12-13 NOTE — Discharge Summary (Signed)
Physician Discharge Summary  Isabel [name] ZHY:[id number] DOB: 08-Oct-1956 DOA: 12/02/2021  PCP: Caren Macadam, MD  Admit date: 12/02/2021 Discharge date: 12/13/2021  Time spent: 55 minutes  Recommendations for Outpatient Follow-up:  Follow-up with Dr. Lyla Glassing orthopedic in 2 weeks.  On follow-up MRI of the right hip obtained on day of discharge will need to be followed up upon. Follow-up with Caren Macadam, MD in 2 weeks.  On follow-up patient will need a CBC done to follow-up on hemoglobin.  Patient will also need basic metabolic profile done to follow-up on electrolytes and renal function. Follow-up with outpatient rehab center.   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  Diet recommendation: Regular.  Filed Weights   12/06/21 0626 12/07/21 0500 12/08/21 1319  Weight: 65.5 kg 65.3 kg 65.3 kg    History of present illness:  HPI per Dr.Howerter  Isabel [name] is a 66 y.o. female with medical history significant for osteoarthritis who is admitted to [facility] on 12/02/2021 with acute left femoral neck fracture after presenting from home to [facility] ED complaining of left hip pain.      Patient reports tripping while attempting to ambulate at home, resulting in a ground level fall during which left hip was the principal point of contact with the floor below. As a result of this fall, the patient reports immediate development of sharp left hip pain, with radiation into the left groin. States that this pain has been constant since onset, with exacerbation when attempting to move the left lower extremity.  As a consequence of the associated intensity of this discomfort, reports  that he is unable to bear weight on the left lower extremity at this time.  This is relative to baseline functional status in which the patient lives independently as well as baseline ambulatory status in which the patient reports the ability to ambulate without assistance, including no need for support devices.  Otherwise, denies any acute arthralgias or myalgias as a result of the above fall.  Denies any acute numbness or paresthesias in bilateral lower extremities.    Did not hit head as a component of this fall, and denies any associated loss of consciousness.  Denies any preceding or associated chest pain, shortness of breath, diaphoresis, palpitations, nausea, vomiting, dizziness, presyncope, or syncope.  Denies any subsequent headache, neck pain, blurry vision, or diplopia.  Not on any blood thinners as an outpatient, including no aspirin.  Denies any known history of coronary artery disease or CHF. denies any recent orthopnea, PND, or peripheral edema.    She has a history of bilateral hip replacements via bilateral hip resurfacing.  Hospital Course:  1 closed left hip femoral neck fracture -Patient states was ambulating and walking and placed too much pressure on her left lower extremity heard a pop with immediate pain with inability to bear weight. -Plain films consistent with left femoral neck fracture. -Patient underwent CT of the left hip which showed left total hip arthroplasty, osteolysis of acetabulum, large periarticular fluid collection around greater trochanter measuring 9 x 4.8 x 10.4 cm concerning for particle disease, smaller fluid collection extending from inferior joint space measuring 3.4 x 1.6 cm concerning  for particle disease.  Commuted left femoral neck fracture with a fracture cleft extending along the medial aspect into the proximal diaphysis. -Preop EKG with normal sinus rhythm. -Patient with no chest pain.  No history of coronary artery disease. -Patient did state had  been having some intermittent palpitations and was scheduled for outpatient 2D echo. -2D echo with a EF of 60-65%, NWMA.  -Patient seen in consultation by orthopedics and patient underwent revision of left hip resurfacing, conversion to total hip arthroplasty per Dr. Lyla Glassing 12/08/2020.  -It is noted by orthopedics note that they called [facility] at patient's request and spoke to Daisey Must who was unable to complete surgery in a more timely fashion as no orthopedics beds available. -PT/OT, TD WBAT left lower extremity with walker x12 weeks per orthopedic recommendations. -MRI of the right hip ordered per orthopedics and pending at time of discharge. -Patient will be discharged home with home health therapies as well as outpatient PT. -Patient placed on aspirin for DVT prophylaxis per orthopedics. -Pain management per orthopedics. -Outpatient follow-up with Dr. Lyla Glassing orthopedics. -Will likely need SNF versus home health per PT.  2.  Fall ruled out.  3.  Postop acute blood loss anemia/symptomatic anemia -Postoperatively patient noted to have a hemoglobin of 7.5 )12/09/2021) with no overt GI bleed. -Patient noted to be symptomatic as when working with PT blood pressure dropped into the 90s and patient with complaints of lightheadedness and dizziness and placed back in bed. -Anemia panel with iron level of 13, TIBC of 188, ferritin of 89, folate of 22.5 -Due to symptomatic anemia, significant drop in hemoglobin postoperatively patient transfused 2 units packed red blood cells (12/09/2021 ). -Hemoglobin stabilized at 8.0.   -Patient received 2 doses of IV iron during the hospitalization.   -Outpatient follow-up with PCP.   4.  Hyponatremia -Likely secondary to hypovolemic hyponatremia as urine sodium noted to be < 10.  Urinalysis with concerns for possible UTI.   -Improved with hydration.    5.  Fever/probable UTI -Patient with some complaints of suprapubic abdominal discomfort early on in the  hospitalization which has since resolved. -Urinalysis done with large leukocytes, nitrite negative, 6-10 WBCs. -Urine cultures with no growth to date.  -Patient noted to have a fever of 101.4 (12/05/2021). -Blood cultures negative after 5 days -Chest x-ray with no acute infiltrate.  -Patient remained afebrile for the rest of the hospitalization. -Status post 3 days IV Rocephin.   -No further antibiotics needed.   -Incentive spirometry, flutter valve.    6.  Hypokalemia -Repleted.   -Outpatient follow-up.    Procedures: CT left hip 12/03/2021 Plain films of the left knee 12/03/2021 Chest x-ray 12/02/2021 Plain films of the left hip and pelvis 12/02/2021. 2D echo 12/04/2021 Transfusion 2 units packed red blood cells 12/09/2021 Revision left hip resurfacing, conversion to total hip arthroplasty per Dr. Lyla Glassing 12/08/2020 MRI right hip 12/13/2021 pending  Consultations: Orthopedics: Hilbert Odor, PA 12/03/2021/Dr. Swinteck    Discharge Exam: Vitals:   12/13/21 0509 12/13/21 1328  BP: 121/61 117/65  Pulse: 82 83  Resp: 18 17  Temp: 98.7 F (37.1 C) 97.7 F (36.5 C)  SpO2: 97% 100%    General: NAD Cardiovascular: Regular rate rhythm no murmurs rubs or gallops.  No JVD.  No lower extremity edema. Respiratory: CTA B.  No wheezes, no crackles, no rhonchi.  Normal respiratory effort.  Discharge Instructions   Discharge Instructions     Ambulatory referral to Physical Therapy   Complete by: As  directed    Diet general   Complete by: As directed    Discharge wound care:   Complete by: As directed    Per Orthopedics   Increase activity slowly   Complete by: As directed       Allergies as of 12/13/2021       Reactions   Erythromycin Diarrhea, Nausea Only   Latex    Sores in mouth        Medication List     TAKE these medications    aspirin 81 MG chewable tablet Chew 1 tablet (81 mg total) by mouth 2 (two) times daily.   BIOTIN 5000 PO Take 5,000 Units by  mouth 2 (two) times daily.   Calcium Citrate 333 MG Tabs Take 333 mg by mouth in the morning and at bedtime.   hydroquinone 4 % cream Apply 1 application topically 2 (two) times daily as needed (leg scars).   MULTI-VITAMIN DAILY PO Take 1 tablet by mouth daily.   NON FORMULARY Take 1 tablet by mouth 2 (two) times daily. supplement for liver- milk thistle   OVER THE COUNTER MEDICATION Take 1 tablet by mouth 2 (two) times daily. Digestive enzyme   oxyCODONE 5 MG immediate release tablet Commonly known as: Oxy IR/ROXICODONE Take 1-2 tablets (5-10 mg total) by mouth every 4 (four) hours as needed for moderate pain (pain score 4-6).   polyethylene glycol 17 g packet Commonly known as: MIRALAX / GLYCOLAX Take 17 g by mouth daily as needed for mild constipation.   PROBIOTIC-10 PO Take 1 tablet by mouth daily.   VITA-C PO Take 1,000 mg by mouth in the morning and at bedtime.   VITAMIN B COMPLEX PO Take 1 tablet by mouth daily.   VITAMIN D3 PO Take 5,000 Units by mouth 2 (two) times a week.               Durable Medical Equipment  (From admission, onward)           Start     Ordered   12/11/21 0803  For home use only DME standard manual wheelchair with seat cushion  Once       Comments: Patient suffers from closed left hip femoral neck fracture which impairs their ability to perform daily activities like bathing in the home.  A cane will not resolve issue with performing activities of daily living. A wheelchair will allow patient to safely perform daily activities. Patient can safely propel the wheelchair in the home or has a caregiver who can provide assistance. Length of need 6 months . Accessories: elevating leg rests (ELRs), wheel locks, extensions and anti-tippers.   12/11/21 0803   12/10/21 1447  For home use only DME Hospital bed  Once       Question Answer Comment  Length of Need 6 Months   Patient has (list medical condition): Recent left total hip revision    The above medical condition requires: Patient requires the ability to reposition frequently   Bed type Semi-electric      12/10/21 1447   12/10/21 1036  For home use only DME Walker rolling  Once       Question Answer Comment  Walker: With [place]   Patient needs a walker to treat with the following condition Hip fracture ([place])      12/10/21 1036   12/10/21 1036  For home use only DME 3 n 1  Once        12/10/21  1036              Discharge Care Instructions  (From admission, onward)           Start     Ordered   12/13/21 0000  Discharge wound care:       Comments: Per Orthopedics   12/13/21 1524           Allergies  Allergen Reactions   Erythromycin Diarrhea and Nausea Only   Latex     Sores in mouth    Follow-up Information     Swinteck, Aaron Edelman, MD. Schedule an appointment as soon as possible for a visit in 2 week(s).   Specialty: Orthopedic Surgery Why: For wound re-check, For suture removal Contact information: [address] [place] [address] [phone]         [facility] Follow up.   Specialty: Rehabilitation Why: They will call to set up an appointment. Contact information: [address] [phone] mc [place] [phone]                 The results of significant diagnostics from this hospitalization (including imaging, microbiology, ancillary and laboratory) are listed below for reference.    Significant Diagnostic Studies: DG Chest 1 View  Result Date: 12/02/2021 CLINICAL DATA:  Hip fracture EXAM: CHEST  1 VIEW COMPARISON:  08/24/2011 FINDINGS: The heart size and mediastinal contours are within normal limits. Both lungs are clear. The visualized skeletal structures are unremarkable. IMPRESSION: No active disease. Electronically Signed   By: Donavan Foil M.D.   On: 12/02/2021 21:12   DG Pelvis Portable  Result Date:  12/08/2021 CLINICAL DATA:  Status post left hip revision. EXAM: PORTABLE PELVIS 1-2 VIEWS COMPARISON:  Operative radiograph dated 12/08/2021. FINDINGS: Total left hip arthroplasty appears intact and in anatomic alignment. Prior arthroplasty of the right femoral head. No acute fracture or dislocation. The bones are osteopenic with degenerative changes of the lower lumbar spine. Postsurgical changes in the soft tissues of the left hip. IMPRESSION: Left hip arthroplasty appears intact and in anatomic alignment. Electronically Signed   By: Anner Crete M.D.   On: 12/08/2021 20:19   CT HIP LEFT WO CONTRAST  Result Date: 12/03/2021 CLINICAL DATA:  Surgical planning.  Surgical planning. EXAM: CT OF THE LEFT HIP WITHOUT CONTRAST TECHNIQUE: Multidetector CT imaging of the left hip was performed according to the standard protocol. Multiplanar CT image reconstructions were also generated. COMPARISON:  None. FINDINGS: Bones/Joint/Cartilage Left total hip arthroplasty. 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 Smaller fluid collection extending from the inferior joint space measuring 3.4 x 1.6 cm. Comminuted left femoral neck fracture with a fracture cleft extending along the medial aspect into the proximal diaphysis. No other fracture or dislocation. Mild osteoarthritis of the left SI joint. No joint effusion. Ligaments Ligaments are suboptimally evaluated by CT. Muscles and Tendons No muscle atrophy. Soft tissue No fluid collection or hematoma.  No soft tissue mass. IMPRESSION: 1. Left total hip arthroplasty. 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 Smaller fluid collection extending from the inferior joint space  measuring 3.4 x 1.6 cm also concerning for particle disease. 2. Comminuted left femoral neck fracture with a fracture  cleft extending along the medial aspect into the proximal diaphysis. Electronically Signed   By: Kathreen Devoid M.D.   On: 12/03/2021 14:56   DG CHEST PORT 1 VIEW  Result Date: 12/05/2021 CLINICAL DATA:  Left hip fracture. Surgery planned in 3 days. Fever today. EXAM: PORTABLE CHEST 1 VIEW COMPARISON:  12/02/2021. FINDINGS: Cardiac silhouette is normal in size. Normal mediastinal and hilar contours. Clear lungs.  No pleural effusion or pneumothorax. Stable changes from prior left breast surgery. Skeletal structures are grossly intact. IMPRESSION: No active disease. Electronically Signed   By: Lajean Manes M.D.   On: 12/05/2021 15:48   DG Knee Left Port  Result Date: 12/03/2021 CLINICAL DATA:  Hip fracture EXAM: PORTABLE LEFT KNEE - 1-2 VIEW COMPARISON:  None. FINDINGS: There is no acute fracture or dislocation. Knee alignment is normal. There is mild tricompartmental joint space narrowing with associated osteophytosis. A small ossific fragment within the knee joint may reflect a loose body. The soft tissues are unremarkable. There is no effusion. IMPRESSION: No acute fracture or dislocation. Electronically Signed   By: Valetta Mole M.D.   On: 12/03/2021 12:04   DG C-Arm 1-60 Min-No Report  Result Date: 12/08/2021 Fluoroscopy was utilized by the requesting physician.  No radiographic interpretation.   DG C-Arm 1-60 Min-No Report  Result Date: 12/08/2021 Fluoroscopy was utilized by the requesting physician.  No radiographic interpretation.   DG C-Arm 1-60 Min-No Report  Result Date: 12/08/2021 Fluoroscopy was utilized by the requesting physician.  No radiographic interpretation.   DG C-Arm 1-60 Min-No Report  Result Date: 12/08/2021 Fluoroscopy was utilized by the requesting physician.  No radiographic interpretation.   ECHOCARDIOGRAM COMPLETE  Result Date: 12/04/2021     ECHOCARDIOGRAM REPORT   Patient Name:   Isabel [name] Date of Exam: 12/04/2021 Medical Rec #:  [id number]            Height:       66.0 in Accession #:    [id number]           Weight:       143.5 lb Date of Birth:  13-Jun-1956             BSA:          1.737 m Patient Age:    4 years             BP:           144/79 mmHg Patient Gender: F                    HR:           90 bpm. Exam Location:  Inpatient Procedure: 2D Echo, Cardiac Doppler and Color Doppler Indications:    Palpitations [785.1.ICD-9-CM]  History:        Patient has no prior history of Echocardiogram examinations.                 Left hip fracture. Past history of breast cancer.  Sonographer:    Darlina Sicilian RDCS Referring Phys: [phone] Aleen Marston V Amreen Raczkowski  Sonographer Comments: Limited motion due to left hip fracture. IMPRESSIONS  1. Left ventricular ejection fraction, by estimation, is 60 to 65%. The left ventricle has normal function. The left ventricle has no regional wall motion abnormalities. Left ventricular diastolic parameters were normal.  2. Right ventricular systolic function is normal. The right ventricular size is normal.  3. The mitral valve is normal in structure. No evidence of mitral  valve regurgitation. No evidence of mitral stenosis.  4. The aortic valve is normal in structure. Aortic valve regurgitation is not visualized. No aortic stenosis is present.  5. The inferior vena cava is normal in size with greater than 50% respiratory variability, suggesting right atrial pressure of 3 mmHg. FINDINGS  Left Ventricle: Left ventricular ejection fraction, by estimation, is 60 to 65%. The left ventricle has normal function. The left ventricle has no regional wall motion abnormalities. The left ventricular internal cavity size was normal in size. There is  no left ventricular hypertrophy. Left ventricular diastolic parameters were normal. Right Ventricle: The right ventricular size is normal. No increase in right ventricular wall  thickness. Right ventricular systolic function is normal. Left Atrium: Left atrial size was normal in size. Right Atrium: Right atrial size was normal in size. Pericardium: There is no evidence of pericardial effusion. Mitral Valve: The mitral valve is normal in structure. No evidence of mitral valve regurgitation. No evidence of mitral valve stenosis. Tricuspid Valve: The tricuspid valve is normal in structure. Tricuspid valve regurgitation is not demonstrated. No evidence of tricuspid stenosis. Aortic Valve: The aortic valve is normal in structure. Aortic valve regurgitation is not visualized. No aortic stenosis is present. Pulmonic Valve: The pulmonic valve was normal in structure. Pulmonic valve regurgitation is not visualized. No evidence of pulmonic stenosis. Aorta: The aortic root is normal in size and structure. Venous: The inferior vena cava is normal in size with greater than 50% respiratory variability, suggesting right atrial pressure of 3 mmHg. IAS/Shunts: No atrial level shunt detected by color flow Doppler.  LEFT VENTRICLE PLAX 2D LVIDd:         4.00 cm   Diastology LVIDs:         2.90 cm   LV e' medial:    9.25 cm/s LV PW:         0.90 cm   LV E/e' medial:  7.2 LV IVS:        1.00 cm   LV e' lateral:   7.83 cm/s LVOT diam:     2.00 cm   LV E/e' lateral: 8.5 LV SV:         87 LV SV Index:   50 LVOT Area:     3.14 cm  RIGHT VENTRICLE RV S prime:     18.00 cm/s TAPSE (M-mode): 1.5 cm LEFT ATRIUM           Index LA diam:      3.00 cm 1.73 cm/m LA Vol (A2C): 23.6 ml 13.59 ml/m LA Vol (A4C): 22.9 ml 13.19 ml/m  AORTIC VALVE LVOT Vmax:   148.00 cm/s LVOT Vmean:  92.600 cm/s LVOT VTI:    0.277 m  AORTA Ao Root diam: 3.10 cm MITRAL VALVE MV Area (PHT): 2.02 cm    SHUNTS MV Decel Time: 375 msec    Systemic VTI:  0.28 m MV E velocity: 66.40 cm/s  Systemic Diam: 2.00 cm MV A velocity: 66.00 cm/s MV E/A ratio:  1.01 Jenkins Rouge MD Electronically signed by Jenkins Rouge MD Signature Date/Time:  12/04/2021/11:21:14 AM    Final    DG HIP OPERATIVE UNILAT W OR W/O PELVIS LEFT  Result Date: 12/08/2021 CLINICAL DATA:  Revision of hip arthroplasty. EXAM: OPERATIVE LEFT HIP (WITH PELVIS IF PERFORMED) 2 VIEWS TECHNIQUE: Fluoroscopic spot image(s) were submitted for interpretation post-operatively. COMPARISON:  Left hip x-ray 12/02/2021. FINDINGS: Left hip total arthroplasty is present in anatomic alignment. Right hip arthroplasty is partially visualized. FLUOROSCOPY  TIME:  21 seconds. IMPRESSION: 1. New left hip total arthroplasty in anatomic alignment. Electronically Signed   By: Ronney Asters M.D.   On: 12/08/2021 19:48   DG Hip Unilat W or Wo Pelvis 2-3 Views Left  Result Date: 12/02/2021 CLINICAL DATA:  Left hip pain. EXAM: DG HIP (WITH OR WITHOUT PELVIS) 2-3V LEFT COMPARISON:  None. FINDINGS: Bilateral femoral head replacements. There is apparent fracture of the superior aspect of the left femoral neck. There is proximal migration of the femoral shaft in relation to the arthroplasty. No dislocation. The bones are osteopenic. Degenerative changes of the lower lumbar spine. The soft tissues are unremarkable. IMPRESSION: Mildly displaced left femoral neck fracture. No dislocation. Electronically Signed   By: Anner Crete M.D.   On: 12/02/2021 20:39    Microbiology: Recent Results (from the past 240 hour(s))  Urine Culture     Status: None   Collection Time: 12/05/21  9:00 AM   Specimen: Urine, Clean Catch  Result Value Ref Range Status   Specimen Description   Final    URINE, CLEAN CATCH Performed at [facility], [place] [address], [place], [place] [address]    Special Requests   Final    NONE Performed at [facility], [place] [address] [place], [place] [address]    Culture   Final    NO GROWTH Performed at [facility], [place] [address], [place], [place] [address]    Report Status 12/06/2021 FINAL  Final  Culture, blood (Routine X 2) w  Reflex to ID Panel     Status: None   Collection Time: 12/05/21  4:43 PM   Specimen: BLOOD  Result Value Ref Range Status   Specimen Description   Final    BLOOD RIGHT ANTECUBITAL Performed at [place] [address] [place], [place] [address]    Special Requests   Final    BOTTLES DRAWN AEROBIC ONLY Blood Culture adequate volume Performed at [place] [address], [place], [place] [address]    Culture   Final    NO GROWTH 5 DAYS Performed at [facility], [place] [address] [place], [place] [address]    Report Status 12/10/2021 FINAL  Final  Culture, blood (Routine X 2) w Reflex to ID Panel     Status: None   Collection Time: 12/05/21  4:50 PM   Specimen: BLOOD  Result Value Ref Range Status   Specimen Description   Final    BLOOD RIGHT ANTECUBITAL Performed at [place] [address] [place], [place] [address]    Special Requests   Final    BOTTLES DRAWN AEROBIC ONLY Blood Culture adequate volume Performed at [place] [address] [place], [place] [address]    Culture   Final    NO GROWTH 5 DAYS Performed at [facility], [place] [address], [place],  [address]    Report Status 12/10/2021 FINAL  Final  Surgical PCR screen     Status: None   Collection Time: 12/07/21  7:32 PM   Specimen: Nasal Mucosa; Nasal Swab  Result Value Ref Range Status   MRSA, PCR NEGATIVE NEGATIVE Final   Staphylococcus aureus NEGATIVE NEGATIVE Final    Comment: (NOTE) The Xpert SA Assay (FDA approved for NASAL specimens in patients 64 years of age and older), is one component of a comprehensive surveillance program. It is not intended to diagnose infection  nor to guide or monitor treatment. Performed at [facility], [place] [address] [place], [place] [address]      Labs: Basic Metabolic Panel: Recent Labs  Lab 12/08/21 [id number] 12/09/21 0328 12/10/21 0332  12/11/21 0326 12/12/21 0315 12/13/21 0333  NA 135 133* 136 136 140 141  K 3.8 3.6 3.6 3.4* 3.8 3.5  CL 101 102 106 106 106 107  CO2 25 23 24 26 27 27   GLUCOSE 103* 215* 103* 97 96 99  BUN 9 12 9  7* 7* 7*  CREATININE 0.40* 0.58 0.45 0.42* 0.38* 0.43*  CALCIUM 9.2 8.2* 8.1* 8.3* 8.6* 8.4*  MG 2.2  --  2.0  --   --   --    Liver Function Tests: No results for input(s): AST, ALT, ALKPHOS, BILITOT, PROT, ALBUMIN in the last 168 hours. No results for input(s): LIPASE, AMYLASE in the last 168 hours. No results for input(s): AMMONIA in the last 168 hours. CBC: Recent Labs  Lab 12/08/21 0839 12/09/21 0328 12/10/21 0332 12/10/21 1346 12/11/21 0326 12/12/21 0315 12/13/21 0333  WBC 4.6 8.2 8.2  --  8.5  --  5.6  NEUTROABS 2.7  --   --   --   --   --   --   HGB 13.6 7.5* 8.7* 9.8* 8.5* 8.1* 8.0*  HCT 40.0 21.7* 25.5* 29.2* 25.7* 24.8* 24.4*  MCV 81.3 82.5 84.4  --  85.1  --  87.1  PLT 240 171 161  --  177  --  267   Cardiac Enzymes: No results for input(s): CKTOTAL, CKMB, CKMBINDEX, TROPONINI in the last 168 hours. BNP: BNP (last 3 results) No results for input(s): BNP in the last 8760 hours.  ProBNP (last 3 results) No results for input(s): PROBNP in the last 8760 hours.  CBG: No results for input(s): GLUCAP in the last 168 hours.     Signed:  Irine Seal MD.  [facility] 12/13/2021, 3:28 PM

## 2021-12-13 NOTE — TOC Transition Note (Signed)
Transition of Care [facility]) - CM/SW Discharge Note  Patient Details  Name: Isabel [name] MRN: [id number] Date of Birth: September 18, 1956  Transition of Care [facility]) CM/SW Contact:  Sherie Don, LCSW Phone Number: 12/13/2021, 12:15 PM  Clinical Narrative: CSW updated by the following [place] agencies:  [facility]: declined [facility]: out-of-network [facility]: patient would have to cover 50% of each visit  Orthopedist and hospitalist agreeable to OPPT referral. Patient is also agreeable, so CSW submitted OPPT referral. CSW confirmed with Andee Poles with [employer] that all DME, including a wheelchair, have been delivered to the home. TOC signing off.  Final next level of care: OP Rehab Barriers to Discharge: No [place] will accept this patient  Patient Goals and CMS Choice Patient states their goals for this hospitalization and ongoing recovery are:: Return home if possible CMS Medicare.gov Compare Post Acute Care list provided to:: Patient Choice offered to / list presented to : Patient  Discharge Plan and Services In-house Referral: Clinical Social Work Post Acute Care Choice: Durable Medical Equipment, Home Health          DME Arranged: Hospital bed, 3-N-1, Walker rolling DME Agency: [facility] Date DME Agency Contacted: 12/10/21 Representative spoke with at DME Agency: Andee Poles  Readmission Risk Interventions No flowsheet data found.

## 2021-12-14 ENCOUNTER — Other Ambulatory Visit (HOSPITAL_COMMUNITY): Payer: Self-pay

## 2021-12-14 ENCOUNTER — Telehealth: Payer: Self-pay

## 2021-12-14 ENCOUNTER — Telehealth: Payer: Self-pay | Admitting: *Deleted

## 2021-12-14 DIAGNOSIS — Z0189 Encounter for other specified special examinations: Secondary | ICD-10-CM

## 2021-12-14 MED ORDER — OXYCODONE HCL 5 MG PO TABS
5.0000 mg | ORAL_TABLET | ORAL | 0 refills | Status: DC | PRN
Start: 2021-12-09 — End: 2022-03-25
  Filled 2021-12-14: qty 40, 4d supply, fill #0

## 2021-12-14 MED ORDER — ASPIRIN 81 MG PO CHEW: CHEWABLE_TABLET | ORAL | 0 refills | Status: DC

## 2021-12-14 NOTE — Telephone Encounter (Signed)
° °  Telephone encounter was:  Successful.  12/14/2021 Name: [name] MRN: [id number] DOB: November 01, 1956  Isabel [name] is a 66 y.o. year old female who is a primary care patient of Koberlein, Steele Berg, MD . The community resource team was consulted for assistance with SW was unable to procure [facility] PT for the Patient so , I;m sending lists of in hame care agencies so she can find a agency that accepts her insurance   Care guide performed the following interventions: Patient provided with information about care guide support team and interviewed to confirm resource needs.  Follow Up Plan:  No further follow up planned at this time. The patient has been provided with needed resources.  [place], Care Management  [phone] [address] , [place] [address] Email : [email or web address]

## 2021-12-14 NOTE — Telephone Encounter (Addendum)
Transition Care Management Unsuccessful Follow-up Telephone Call  Date of discharge and from where:  [place] 12-13-21 Dx: closed left hip fx/ blood loss anemia   Attempts:  1st Attempt  Reason for unsuccessful TCM follow-up call:  Left voice message  Transition Care Management Follow-up Telephone Call Date of discharge and from where: [place] 12-13-21 Dx: closed left hip fx/ blood loss anemia How have you been since you were released from the hospital? Better but weak  Any questions or concerns? No  Items Reviewed: Did the pt receive and understand the discharge instructions provided? Yes  Medications obtained and verified? Yes  Other? No  Any new allergies since your discharge? No  Dietary orders reviewed? Yes Do you have support at home? Yes   Home Care and Equipment/Supplies: Were home health services ordered? Yes- but insurance would not cover- pt was told it would be best to contact another company and pay privately.  Has the agency set up a time to come to the patient's home? no Were any new equipment or medical supplies ordered?  Yes: walker , wheelchair, hospital bed What is the name of the medical supply agency? From the hospital Were you able to get the supplies/equipment? yes Do you have any questions related to the use of the equipment or supplies? No  Functional Questionnaire: (I = Independent and D = Dependent) ADLs: I  Bathing/Dressing- I  Meal Prep- D  Eating- I  Maintaining continence- I  Transferring/Ambulation- D  Managing Meds- I  Follow up appointments reviewed:  PCP Hospital f/u appt confirmed? Yes  Scheduled to see Dr Ethlyn Gallery on 12-24-21 @ [facility] f/u appt confirmed? Yes  Scheduled to see Dr Delfino Lovett on 12-21-21 @ 10am. Are transportation arrangements needed? No  If their condition worsens, is the pt aware to call PCP or go to the Emergency Dept.? Yes Was the patient provided with contact information for the  PCP's office or ED? Yes Was to pt encouraged to call back with questions or concerns? Yes

## 2021-12-21 ENCOUNTER — Ambulatory Visit: Payer: Self-pay | Admitting: Orthopedic Surgery

## 2021-12-21 DIAGNOSIS — Z4789 Encounter for other orthopedic aftercare: Secondary | ICD-10-CM | POA: Diagnosis not present

## 2021-12-21 DIAGNOSIS — T562X1D Toxic effect of chromium and its compounds, accidental (unintentional), subsequent encounter: Secondary | ICD-10-CM | POA: Diagnosis not present

## 2021-12-21 DIAGNOSIS — Z471 Aftercare following joint replacement surgery: Secondary | ICD-10-CM | POA: Diagnosis not present

## 2021-12-21 DIAGNOSIS — M9702XD Periprosthetic fracture around internal prosthetic left hip joint, subsequent encounter: Secondary | ICD-10-CM | POA: Diagnosis not present

## 2021-12-24 ENCOUNTER — Encounter: Payer: Self-pay | Admitting: Family Medicine

## 2021-12-24 ENCOUNTER — Ambulatory Visit (INDEPENDENT_AMBULATORY_CARE_PROVIDER_SITE_OTHER): Payer: 59 | Admitting: Family Medicine

## 2021-12-24 VITALS — BP 102/68 | HR 95 | Temp 99.0°F | Ht 66.0 in

## 2021-12-24 DIAGNOSIS — E871 Hypo-osmolality and hyponatremia: Secondary | ICD-10-CM | POA: Diagnosis not present

## 2021-12-24 DIAGNOSIS — R600 Localized edema: Secondary | ICD-10-CM

## 2021-12-24 DIAGNOSIS — T5694XA Toxic effect of unspecified metal, undetermined, initial encounter: Secondary | ICD-10-CM

## 2021-12-24 DIAGNOSIS — D62 Acute posthemorrhagic anemia: Secondary | ICD-10-CM

## 2021-12-24 DIAGNOSIS — Z7409 Other reduced mobility: Secondary | ICD-10-CM

## 2021-12-24 MED ORDER — FUROSEMIDE 20 MG PO TABS: 20.0000 mg | ORAL_TABLET | Freq: Every day | ORAL | 2 refills | Status: DC | PRN

## 2021-12-24 NOTE — Patient Instructions (Signed)
On amazon: "mojo" - cotton feeling and you can get 22mmHg  All heart nursing supply store (online) -many types of compression. May want to get a 28mmHg stocking to initiate, then follow with 20. (Even consider 47mmHg to start).

## 2021-12-24 NOTE — Progress Notes (Signed)
Isabel [name] DOB: 1956-05-19 Encounter date: 12/24/2021  This is a 66 y.o. female who presents with Chief Complaint  Patient presents with   Hospitalization Follow-up    History of present illness: Admitted to hospital 12/02/2021 and discharged 12/13/2021.  Suggested follow-up of CBC and BMP due to postoperative anemia.  Diagnosed with closed left hip femoral neck fracture.  She underwent left hip resurfacing with conversion to a total hip arthroplasty.  Postop hemoglobin was 7.5.  She did have some hypotensive episodes.  She received 2 units of blood.  Additionally had hypovolemic hyponatremia that improved with hydration.  She received 3 days of IV Rocephin for concern of UTI, although urine cultures showed no growth.  Hx of hip resurfacing. Had a lot of clicking/popping but no pain. Then it got painful. By time she went to sign release papers to get info to surgeon just stepped wrong and hip just "went". Surgeon found that cup in pelvis was not in proper place contributing to clicking and popping. Toe weight bearing only which makes it hard to do everything. Rented hospital bed. Trying to navigate house with walker. Right hip looks ok on MRI, but she states that it does get sore. Also has to keep 90 degree without internal/external motions left hip.   Pain fluctuates. Trying not to use oxy.   Thought she would get in home therapy once she was back at home. She can't do much until she is weight bearing. 12 weeks until she is weight bearing due to bone grafts on that side.   Sleeping ok for most part; limited positions for lying down.  Appetite is ok.   Does feel that energy is doing better.not on iron, but does take MVI.   Does have some left sided foot swelling. Knee looks better, but swelling below that.notes said to use stocking, but she wasn't given this.   Had follow up on tues with ortho. Next follow up is in 1 month.   Allergies  Allergen Reactions   Erythromycin Diarrhea  and Nausea Only   Latex     Sores in mouth   Current Meds  Medication Sig   Ascorbic Acid (VITA-C PO) Take 1,000 mg by mouth in the morning and at bedtime.   aspirin 81 MG chewable tablet Chew 1 tablet (81 mg total) by mouth 2 (two) times daily.   B Complex Vitamins (VITAMIN B COMPLEX PO) Take 1 tablet by mouth daily.   BIOTIN 5000 PO Take 5,000 Units by mouth 2 (two) times daily.   Calcium Citrate 333 MG TABS Take 333 mg by mouth in the morning and at bedtime.   Cholecalciferol (VITAMIN D3 PO) Take 5,000 Units by mouth 2 (two) times a week.   furosemide (LASIX) 20 MG tablet Take 1 tablet (20 mg total) by mouth daily as needed.   hydroquinone 4 % cream Apply 1 application topically 2 (two) times daily as needed (leg scars).   Multiple Vitamin (MULTI-VITAMIN DAILY PO) Take 1 tablet by mouth daily.   NON FORMULARY Take 1 tablet by mouth 2 (two) times daily. supplement for liver- milk thistle   OVER THE COUNTER MEDICATION Take 1 tablet by mouth 2 (two) times daily. Digestive enzyme   oxyCODONE (OXY IR/ROXICODONE) 5 MG immediate release tablet Take 1 - 2 tablets by mouth every 4 hours as needed for moderate pain (pain score 4 - 6)   Probiotic Product (PROBIOTIC-10 PO) Take 1 tablet by mouth daily.    Review of Systems  Constitutional:  Positive for fatigue (better, but still some fatigue). Negative for chills and fever.  Respiratory:  Negative for cough, chest tightness, shortness of breath and wheezing.   Cardiovascular:  Negative for chest pain, palpitations and leg swelling.  Musculoskeletal:  Positive for arthralgias.   Objective:  BP 102/68 (BP Location: Right Arm, Patient Position: Sitting, Cuff Size: Normal)    Pulse 95    Temp 99 F (37.2 C) (Oral)    Ht 5\' 6"  (1.676 m)    LMP 12/05/2012    SpO2 97%    BMI 23.24 kg/m       BP Readings from Last 3 Encounters:  12/24/21 102/68  12/13/21 117/65  07/21/21 110/80   Wt Readings from Last 3 Encounters:  12/08/21 143 lb 15.4 oz  (65.3 kg)  07/21/21 145 lb 6.4 oz (66 kg)  10/20/20 147 lb (66.7 kg)    Physical Exam Constitutional:      General: She is not in acute distress.    Appearance: She is well-developed.  Cardiovascular:     Rate and Rhythm: Normal rate and regular rhythm.     Heart sounds: Normal heart sounds. No murmur heard.   No friction rub.  Pulmonary:     Effort: Pulmonary effort is normal. No respiratory distress.     Breath sounds: Normal breath sounds. No wheezing or rales.  Musculoskeletal:     Right lower leg: No edema.     Left lower leg: No edema.     Comments: Patient is currently using a walker with toe weightbearing only on the left foot.  Neurological:     Mental Status: She is alert and oriented to person, place, and time.  Psychiatric:        Behavior: Behavior normal.    Assessment/Plan  1. Limited mobility We discussed getting home evaluation to see if there are any ways that we can help her to remain as independent as possible in the home in a safe manner.  Referral was placed for this today.  2. Postoperative anemia due to acute blood loss She did receive iron infusion as well as 2 units of blood.  We will recheck blood work today. - CBC with Differential/Platelet; Future - CBC with Differential/Platelet  3. Metallosis, undetermined intent, initial encounter This lab was ordered by Ortho and reentered by me so she could complete in our office today. - Chromium, Serum; Future - Cobalt, Serum/Plasma; Future - Cobalt, Serum/Plasma - Chromium, Serum  4. Hyponatremia Recheck blood work today.  She is back to normal eating and drinking routine.  I suspect metabolic panel will be normal. - Comprehensive metabolic panel; Future - Comprehensive metabolic panel  5. Edema of left lower extremity Mild edema.  We discussed using compression stockings and I gave her the name of a couple of websites where she can find some.  She may need to start with a lower grade compression  to be able to get these on.  She is unable to elevate her foot above heart level due to restrictions on her hip, we did discuss that edema should improve as she is able to become more active.  Additionally, I did print a prescription for furosemide in case she feels that the edema is getting somewhat worse.  She can use this as needed. - Ambulatory referral to [place]  Return in about 3 months (around 03/24/2022) for Chronic condition visit.     Micheline Rough, MD

## 2021-12-29 LAB — COMPREHENSIVE METABOLIC PANEL
AG Ratio: 1.9 (calc) (ref 1.0–2.5)
ALT: 18 U/L (ref 6–29)
AST: 21 U/L (ref 10–35)
Albumin: 4.6 g/dL (ref 3.6–5.1)
Alkaline phosphatase (APISO): 105 U/L (ref 37–153)
BUN: 15 mg/dL (ref 7–25)
CO2: 30 mmol/L (ref 20–32)
Calcium: 9.8 mg/dL (ref 8.6–10.4)
Chloride: 102 mmol/L (ref 98–110)
Creat: 0.64 mg/dL (ref 0.50–1.05)
Globulin: 2.4 g/dL (calc) (ref 1.9–3.7)
Glucose, Bld: 90 mg/dL (ref 65–99)
Potassium: 4.3 mmol/L (ref 3.5–5.3)
Sodium: 138 mmol/L (ref 135–146)
Total Bilirubin: 0.4 mg/dL (ref 0.2–1.2)
Total Protein: 7 g/dL (ref 6.1–8.1)

## 2021-12-29 LAB — CBC WITH DIFFERENTIAL/PLATELET
Absolute Monocytes: 531 cells/uL (ref 200–950)
Basophils Absolute: 67 cells/uL (ref 0–200)
Basophils Relative: 1.1 %
Eosinophils Absolute: 140 cells/uL (ref 15–500)
Eosinophils Relative: 2.3 %
HCT: 35.2 % (ref 35.0–45.0)
Hemoglobin: 11.4 g/dL — ABNORMAL LOW (ref 11.7–15.5)
Lymphs Abs: 1476 cells/uL (ref 850–3900)
MCH: 28.6 pg (ref 27.0–33.0)
MCHC: 32.4 g/dL (ref 32.0–36.0)
MCV: 88.2 fL (ref 80.0–100.0)
MPV: 9.7 fL (ref 7.5–12.5)
Monocytes Relative: 8.7 %
Neutro Abs: 3886 cells/uL (ref 1500–7800)
Neutrophils Relative %: 63.7 %
Platelets: 412 10*3/uL — ABNORMAL HIGH (ref 140–400)
RBC: 3.99 10*6/uL (ref 3.80–5.10)
RDW: 13.9 % (ref 11.0–15.0)
Total Lymphocyte: 24.2 %
WBC: 6.1 10*3/uL (ref 3.8–10.8)

## 2021-12-29 LAB — CHROMIUM, SERUM: Chromium, Serum: 55.5 mcg/L — ABNORMAL HIGH (ref <=1.4)

## 2021-12-29 LAB — COBALT, SERUM/PLASMA: Cobalt, Serum/Plasma: 36.4 mcg/L — ABNORMAL HIGH (ref 0.1–0.4)

## 2022-01-13 DIAGNOSIS — M978XXA Periprosthetic fracture around other internal prosthetic joint, initial encounter: Secondary | ICD-10-CM | POA: Diagnosis not present

## 2022-01-13 DIAGNOSIS — S72002A Fracture of unspecified part of neck of left femur, initial encounter for closed fracture: Secondary | ICD-10-CM | POA: Diagnosis not present

## 2022-01-13 DIAGNOSIS — Z96649 Presence of unspecified artificial hip joint: Secondary | ICD-10-CM | POA: Diagnosis not present

## 2022-01-13 DIAGNOSIS — T8484XA Pain due to internal orthopedic prosthetic devices, implants and grafts, initial encounter: Secondary | ICD-10-CM | POA: Diagnosis not present

## 2022-01-18 DIAGNOSIS — Z4789 Encounter for other orthopedic aftercare: Secondary | ICD-10-CM | POA: Diagnosis not present

## 2022-01-18 DIAGNOSIS — Z471 Aftercare following joint replacement surgery: Secondary | ICD-10-CM | POA: Diagnosis not present

## 2022-02-01 DIAGNOSIS — M25552 Pain in left hip: Secondary | ICD-10-CM | POA: Diagnosis not present

## 2022-02-09 DIAGNOSIS — M25552 Pain in left hip: Secondary | ICD-10-CM | POA: Diagnosis not present

## 2022-02-10 DIAGNOSIS — T8484XA Pain due to internal orthopedic prosthetic devices, implants and grafts, initial encounter: Secondary | ICD-10-CM | POA: Diagnosis not present

## 2022-02-10 DIAGNOSIS — Z96649 Presence of unspecified artificial hip joint: Secondary | ICD-10-CM | POA: Diagnosis not present

## 2022-02-10 DIAGNOSIS — S72002A Fracture of unspecified part of neck of left femur, initial encounter for closed fracture: Secondary | ICD-10-CM | POA: Diagnosis not present

## 2022-02-10 DIAGNOSIS — M978XXA Periprosthetic fracture around other internal prosthetic joint, initial encounter: Secondary | ICD-10-CM | POA: Diagnosis not present

## 2022-02-15 DIAGNOSIS — M25552 Pain in left hip: Secondary | ICD-10-CM | POA: Diagnosis not present

## 2022-02-21 DIAGNOSIS — M25552 Pain in left hip: Secondary | ICD-10-CM | POA: Diagnosis not present

## 2022-02-28 DIAGNOSIS — M25552 Pain in left hip: Secondary | ICD-10-CM | POA: Diagnosis not present

## 2022-03-01 DIAGNOSIS — Z4789 Encounter for other orthopedic aftercare: Secondary | ICD-10-CM | POA: Diagnosis not present

## 2022-03-01 DIAGNOSIS — Z471 Aftercare following joint replacement surgery: Secondary | ICD-10-CM | POA: Diagnosis not present

## 2022-03-04 ENCOUNTER — Other Ambulatory Visit (HOSPITAL_COMMUNITY): Payer: Self-pay

## 2022-03-04 MED ORDER — AMOXICILLIN 500 MG PO CAPS
ORAL_CAPSULE | ORAL | 0 refills | Status: DC
  Filled 2022-03-04: qty 20, 5d supply, fill #0

## 2022-03-08 DIAGNOSIS — M25552 Pain in left hip: Secondary | ICD-10-CM | POA: Diagnosis not present

## 2022-03-14 DIAGNOSIS — M25552 Pain in left hip: Secondary | ICD-10-CM | POA: Diagnosis not present

## 2022-03-21 DIAGNOSIS — M25552 Pain in left hip: Secondary | ICD-10-CM | POA: Diagnosis not present

## 2022-03-25 ENCOUNTER — Ambulatory Visit (INDEPENDENT_AMBULATORY_CARE_PROVIDER_SITE_OTHER): Payer: 59 | Admitting: Family Medicine

## 2022-03-25 ENCOUNTER — Encounter: Payer: Self-pay | Admitting: Family Medicine

## 2022-03-25 VITALS — BP 120/70 | HR 94 | Temp 100.0°F | Ht 66.0 in | Wt 144.0 lb

## 2022-03-25 DIAGNOSIS — E042 Nontoxic multinodular goiter: Secondary | ICD-10-CM

## 2022-03-25 DIAGNOSIS — M25512 Pain in left shoulder: Secondary | ICD-10-CM

## 2022-03-25 DIAGNOSIS — R790 Abnormal level of blood mineral: Secondary | ICD-10-CM

## 2022-03-25 DIAGNOSIS — J636 Pneumoconiosis due to other specified inorganic dusts: Secondary | ICD-10-CM | POA: Diagnosis not present

## 2022-03-25 NOTE — Progress Notes (Signed)
?Isabel [name] ?DOB: [birth year] ?Encounter date: 03/25/2022 ? ?This is a 66 y.o. female who presents with ?Chief Complaint  ?Patient presents with  ? Follow-up  ? ? ?History of present illness: ?Last visit was 12/24/2021.  At that time she had been recently discharged from hospital status post closed left hip femoral neck fracture with left hip resurfacing and conversion to a total hip.  She was completely limited with her mobility at that time. ? ?Still using cane at times. Mood is not good. Having problem still up in sitz bones. Had 90 degree precautions and was toe touch only x 2 months. Now at point where stairs are problem. She is able to bend over now to pick things up. Still hurts with putting weight on her, but feels better after getting walking again. Every time first getting up with bending she feels this. Ortho isn't saying anything about that pain. She is still restricted with lifting/walking. Still can't do things like own laundry. Goes once a week to therapy. Does own exercises at home daily. Hopes that with going back to work will help with mobility. Will be on concrete floors for 8 hours/day. Top of thigh is still numb from scar down 4-5 inches. Feels that if any improvement it is really slow.  ? ?Generally sleep has been ok. Not good last night.  ? ?Colonoscopy due 09/2022 ?  ?Thyroid US followup for nodules due 07/2022 ?  ?Benign mammogram 11/20/2021. ?Follows with obgyn regularly - [place] ? ? ?Allergies  ?Allergen Reactions  ? Erythromycin Diarrhea and Nausea Only  ? Latex   ?  Sores in mouth  ? ?Current Meds  ?Medication Sig  ? amoxicillin (AMOXIL) 500 MG capsule Take 4 capsules by mouth 1-2 hours before dental work.  ? Ascorbic Acid (VITA-C PO) Take 1,000 mg by mouth in the morning and at bedtime.  ? B Complex Vitamins (VITAMIN B COMPLEX PO) Take 1 tablet by mouth daily.  ? BIOTIN 5000 PO Take 5,000 Units by mouth 2 (two) times daily.  ? Calcium Citrate 333 MG TABS Take 333 mg by mouth in the  morning and at bedtime.  ? Cholecalciferol (VITAMIN D3 PO) Take 2,000 Units by mouth daily.  ? hydroquinone 4 % cream Apply 1 application topically 2 (two) times daily as needed (leg scars).  ? Multiple Vitamin (MULTI-VITAMIN DAILY PO) Take 1 tablet by mouth daily.  ? NON FORMULARY Take 1 tablet by mouth 2 (two) times daily. supplement for liver- milk thistle  ? OVER THE COUNTER MEDICATION Take 1 tablet by mouth 2 (two) times daily. Digestive enzyme  ? Probiotic Product (PROBIOTIC-10 PO) Take 1 tablet by mouth daily.  ? ? ?Review of Systems  ?Constitutional:  Negative for activity change, appetite change, chills, fatigue, fever and unexpected weight change.  ?HENT:  Negative for congestion, ear pain, hearing loss, sinus pressure, sinus pain, sore throat and trouble swallowing.   ?Eyes:  Negative for pain and visual disturbance.  ?Respiratory:  Negative for cough, chest tightness, shortness of breath and wheezing.   ?Cardiovascular:  Negative for chest pain, palpitations and leg swelling.  ?Gastrointestinal:  Negative for abdominal pain, blood in stool, constipation, diarrhea, nausea and vomiting.  ?Genitourinary:  Negative for difficulty urinating and menstrual problem.  ?Musculoskeletal:  Negative for arthralgias and back pain.  ?Skin:  Negative for rash.  ?Neurological:  Negative for dizziness, weakness, numbness and headaches.  ?Hematological:  Negative for adenopathy. Does not bruise/bleed easily.  ?Psychiatric/Behavioral:  Negative for sleep  disturbance and suicidal ideas. The patient is not nervous/anxious.   ? ?Objective: ? ?BP 120/70 (BP Location: Left Arm, Patient Position: Sitting, Cuff Size: Normal)   Pulse 94   Temp 100 ?F (37.8 ?C) (Oral)   Ht '5\' 6"'$  (1.676 m)   Wt 144 lb (65.3 kg)   LMP 12/05/2012   SpO2 98%   BMI 23.24 kg/m?   Weight: 144 lb (65.3 kg)  ? ?BP Readings from Last 3 Encounters:  ?03/25/22 120/70  ?12/24/21 102/68  ?12/13/21 117/65  ? ?Wt Readings from Last 3 Encounters:  ?03/25/22  144 lb (65.3 kg)  ?12/08/21 143 lb 15.4 oz (65.3 kg)  ?07/21/21 145 lb 6.4 oz (66 kg)  ? ? ?Physical Exam ?Constitutional:   ?   General: She is not in acute distress. ?   Appearance: She is well-developed.  ?Cardiovascular:  ?   Rate and Rhythm: Normal rate and regular rhythm.  ?   Heart sounds: Normal heart sounds. No murmur heard. ?  No friction rub.  ?Pulmonary:  ?   Effort: Pulmonary effort is normal. No respiratory distress.  ?   Breath sounds: Normal breath sounds. No wheezing or rales.  ?Musculoskeletal:  ?   Right lower leg: No edema.  ?   Left lower leg: No edema.  ?   Comments: +pain and slight weakness with subscap testing, no limits in ROM or other weakness noted left shoulder.  ?Neurological:  ?   Mental Status: She is alert and oriented to person, place, and time.  ?Psychiatric:     ?   Behavior: Behavior normal.  ? ? ?Assessment/Plan ? 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 ?2. Abnormal blood level of chromium ?- Chromium level; Future ? ?3. Multiple thyroid nodules ?Due in august for recheck on thyroid ?- US THYROID; Future ? ?4. Left shoulder pain - ice, rest (no overhead lifting) and consider further eval if not improvement in 2 weeks. ? ?Return if symptoms worsen or fail to improve. ? ?39 minutes spent with patient in discussion of recovery from surgery, stretching, exercising.  ? ? ?Micheline Rough, MD ?

## 2022-03-25 NOTE — Patient Instructions (Addendum)
Neoprene sleeve for thigh; check with ortho if you can have any increase in degree of stretch for hamstring. ?

## 2022-03-29 DIAGNOSIS — D1801 Hemangioma of skin and subcutaneous tissue: Secondary | ICD-10-CM | POA: Diagnosis not present

## 2022-03-29 DIAGNOSIS — L819 Disorder of pigmentation, unspecified: Secondary | ICD-10-CM | POA: Diagnosis not present

## 2022-03-30 ENCOUNTER — Ambulatory Visit
Admission: RE | Admit: 2022-03-30 | Discharge: 2022-03-30 | Disposition: A | Discharge status: Home / Self Care | Payer: 59 | Source: Ambulatory Visit | Attending: Family Medicine | Admitting: Family Medicine

## 2022-03-30 DIAGNOSIS — E041 Nontoxic single thyroid nodule: Secondary | ICD-10-CM | POA: Diagnosis not present

## 2022-03-30 DIAGNOSIS — E042 Nontoxic multinodular goiter: Secondary | ICD-10-CM

## 2022-03-30 LAB — COBALT, SERUM/PLASMA: Cobalt, Serum/Plasma: 7.5 mcg/L — ABNORMAL HIGH (ref 0.1–0.4)

## 2022-03-30 LAB — CHROMIUM LEVEL: Chromium: 16.5 mcg/L — ABNORMAL HIGH (ref <=1.2)

## 2022-04-04 DIAGNOSIS — M25552 Pain in left hip: Secondary | ICD-10-CM | POA: Diagnosis not present

## 2022-04-25 DIAGNOSIS — Z471 Aftercare following joint replacement surgery: Secondary | ICD-10-CM | POA: Diagnosis not present

## 2022-04-25 DIAGNOSIS — Z96642 Presence of left artificial hip joint: Secondary | ICD-10-CM | POA: Diagnosis not present

## 2022-06-13 ENCOUNTER — Other Ambulatory Visit (HOSPITAL_COMMUNITY): Payer: Self-pay

## 2022-06-13 DIAGNOSIS — Z96641 Presence of right artificial hip joint: Secondary | ICD-10-CM | POA: Diagnosis not present

## 2022-06-13 DIAGNOSIS — Z124 Encounter for screening for malignant neoplasm of cervix: Secondary | ICD-10-CM | POA: Diagnosis not present

## 2022-06-13 DIAGNOSIS — Z6823 Body mass index (BMI) 23.0-23.9, adult: Secondary | ICD-10-CM | POA: Diagnosis not present

## 2022-06-13 DIAGNOSIS — Z471 Aftercare following joint replacement surgery: Secondary | ICD-10-CM | POA: Diagnosis not present

## 2022-06-13 DIAGNOSIS — Z01419 Encounter for gynecological examination (general) (routine) without abnormal findings: Secondary | ICD-10-CM | POA: Diagnosis not present

## 2022-06-13 DIAGNOSIS — Z96642 Presence of left artificial hip joint: Secondary | ICD-10-CM | POA: Diagnosis not present

## 2022-06-13 MED ORDER — MELOXICAM 15 MG PO TABS
15.0000 mg | ORAL_TABLET | Freq: Every day | ORAL | 3 refills | Status: DC
  Filled 2022-06-13: qty 30, 30d supply, fill #0

## 2022-06-24 DIAGNOSIS — R2689 Other abnormalities of gait and mobility: Secondary | ICD-10-CM | POA: Diagnosis not present

## 2022-06-24 DIAGNOSIS — M25552 Pain in left hip: Secondary | ICD-10-CM | POA: Diagnosis not present

## 2022-07-04 ENCOUNTER — Telehealth: Payer: Self-pay | Admitting: Family Medicine

## 2022-07-04 NOTE — Telephone Encounter (Signed)
Pt is calling back and said Isabel [name] called her on Friday. I do not see message . Pt does have appt on 07-11-2022 with dr Legrand Como and she is aware

## 2022-07-04 NOTE — Telephone Encounter (Signed)
See prior referral note regarding order for home health care.  Patient stated she did not receive home health at the time the order was placed and no longer needs this currently.

## 2022-07-11 ENCOUNTER — Encounter: Payer: Self-pay | Admitting: Family Medicine

## 2022-07-11 ENCOUNTER — Other Ambulatory Visit (HOSPITAL_COMMUNITY): Payer: Self-pay

## 2022-07-11 ENCOUNTER — Ambulatory Visit (INDEPENDENT_AMBULATORY_CARE_PROVIDER_SITE_OTHER): Payer: 59 | Admitting: Family Medicine

## 2022-07-11 VITALS — BP 116/80 | HR 70 | Temp 98.1°F | Ht 66.0 in | Wt 143.2 lb

## 2022-07-11 DIAGNOSIS — M25552 Pain in left hip: Secondary | ICD-10-CM | POA: Diagnosis not present

## 2022-07-11 DIAGNOSIS — Z13220 Encounter for screening for lipoid disorders: Secondary | ICD-10-CM

## 2022-07-11 DIAGNOSIS — D62 Acute posthemorrhagic anemia: Secondary | ICD-10-CM

## 2022-07-11 DIAGNOSIS — R2689 Other abnormalities of gait and mobility: Secondary | ICD-10-CM | POA: Diagnosis not present

## 2022-07-11 DIAGNOSIS — B351 Tinea unguium: Secondary | ICD-10-CM | POA: Diagnosis not present

## 2022-07-11 DIAGNOSIS — Z96641 Presence of right artificial hip joint: Secondary | ICD-10-CM | POA: Diagnosis not present

## 2022-07-11 DIAGNOSIS — E042 Nontoxic multinodular goiter: Secondary | ICD-10-CM

## 2022-07-11 MED ORDER — CICLOPIROX 8 % EX SOLN
Freq: Every day | CUTANEOUS | 0 refills | Status: DC
  Filled 2022-07-11: qty 6.6, 30d supply, fill #0

## 2022-07-11 NOTE — Assessment & Plan Note (Signed)
History of, this happened after her hip surgery, will get new labs to check her hemoglobin level today

## 2022-07-11 NOTE — Assessment & Plan Note (Signed)
Most likely diagnosis, will call in ciclopirox solution for her toenails.

## 2022-07-11 NOTE — Assessment & Plan Note (Signed)
Stable, pt is asymptomatic, will order her yearly TSH check

## 2022-07-11 NOTE — Patient Instructions (Signed)
Call your insurance company to determine if the Cologuard test is covered. If so please call the office and I will send in the order for the test.

## 2022-07-11 NOTE — Progress Notes (Signed)
Established Patient Office Visit  Subjective   Patient ID: Isabel [name], female    DOB: August 17, 1956  Age: 66 y.o. MRN: [id number]  Chief Complaint  Patient presents with   Establish Care    Patient is here for follow up. Patient states that she underwent hip surgery and her cobalt levels were elevated, states she needs to get additional follow up labs for this today.   Reports she is feeling well overall, has no new symptoms or issues to report today. We reviewed her medical history, surgical history, and medications. She is not currently on any prescribed medications, mostly taking OTC supplements.  We also reviewed her health maintenance measures, states she is coming up on being due for colon cancer screening, we discussed options for this and she is interested in the cologuard test. I advised she ask her insurance company if they cover the test and then I will order it for her. Pt is UTD on her pap smear, mammogram. Will need a DEXA scan at age 37.   Pt is reporting worsening toenails, the 4th toe on both the left and right feet. States the OTC antifungal medications are not working to help. States they are yellowed, thickened, and hardened, that they easily break off.     Patient Active Problem List   Diagnosis Date Noted   Onychomycosis of toenail 07/11/2022   Postoperative anemia due to acute blood loss 12/09/2021   Fever 12/05/2021   Acute lower UTI 12/05/2021   Left hip pain 12/03/2021   Fall at home, initial encounter 12/03/2021   Periprosthetic fracture around internal prosthetic hip joint    Closed left hip fracture ([place]) 12/02/2021   Mass of joint of right wrist 10/20/2020   Arthritis of carpometacarpal [facility]) joint of right thumb 10/20/2020   Synovitis of toe 06/21/2019   Arthritis of midfoot 06/21/2019   Pain in hip region after total hip replacement ([place]) 06/05/2018   Multinodular goiter 01/11/2017   Varicosities of leg 08/07/2015   Osteoarthritis  08/07/2015   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     Objective:     BP 116/80 (BP Location: Left Arm, Patient Position: Sitting, Cuff Size: Normal)   Pulse 70   Temp 98.1 F (36.7 C) (Oral)   Ht '5\' 6"'$  (1.676 m)   Wt 143 lb 3.2 oz (65 kg)   LMP 12/05/2012   SpO2 98%   BMI 23.11 kg/m    Physical Exam Vitals reviewed.  Constitutional:      Appearance: Normal appearance. She is well-groomed and normal weight.  HENT:     Head: Normocephalic and atraumatic.  Eyes:     Extraocular Movements: Extraocular movements intact.     Pupils: Pupils are equal, round, and reactive to light.  Cardiovascular:     Rate and Rhythm: Normal rate and regular rhythm.     Heart sounds: S1 normal and S2 normal.  Pulmonary:     Effort: Pulmonary effort is normal.     Breath sounds: Normal breath sounds and air entry.  Abdominal:     General: Abdomen is flat. Bowel sounds are normal.     Palpations: Abdomen is soft.  Musculoskeletal:        General: Normal range of motion.     Cervical back: Normal range of motion and neck supple.     Right lower leg:  No edema.     Left lower leg: No edema.  Skin:    General: Skin is warm and dry.  Neurological:     Mental Status: She is alert and oriented to person, place, and time. Mental status is at baseline.     Gait: Gait is intact.  Psychiatric:        Mood and Affect: Mood and affect normal.        Speech: Speech normal.        Behavior: Behavior normal.        Judgment: Judgment normal.      No results found for any visits on 07/11/22.  Last lipids Lab Results  Component Value Date   CHOL 167 07/21/2021   HDL 77.10 07/21/2021   LDLCALC 69 07/21/2021   TRIG 104.0 07/21/2021   CHOLHDL 2 07/21/2021      The 10-year ASCVD risk score (Arnett DK, et al., 2019) is: 3.2%    Assessment & Plan:   Problem List Items  Addressed This Visit       Endocrine   Multinodular goiter - Primary    Stable, pt is asymptomatic, will order her yearly TSH check      Relevant Orders   CMP   TSH     Musculoskeletal and Integument   Onychomycosis of toenail    Most likely diagnosis, will call in ciclopirox solution for her toenails.      Relevant Medications   ciclopirox (PENLAC) 8 % solution     Other   Postoperative anemia due to acute blood loss    History of, this happened after her hip surgery, will get new labs to check her hemoglobin level today      Relevant Orders   CBC (no diff)   Other Visit Diagnoses     Screening for lipid disorders       Relevant Orders   Lipid panel   History of revision of total replacement of right hip joint       Relevant Orders   Cobalt, Serum/Plasma   Chromium level       Return in about 1 year (around 07/12/2023) for annual preventative health visit.    Farrel Conners, MD

## 2022-07-13 ENCOUNTER — Other Ambulatory Visit (INDEPENDENT_AMBULATORY_CARE_PROVIDER_SITE_OTHER): Payer: 59

## 2022-07-13 DIAGNOSIS — E042 Nontoxic multinodular goiter: Secondary | ICD-10-CM | POA: Diagnosis not present

## 2022-07-13 DIAGNOSIS — Z96641 Presence of right artificial hip joint: Secondary | ICD-10-CM

## 2022-07-13 DIAGNOSIS — Z13220 Encounter for screening for lipoid disorders: Secondary | ICD-10-CM | POA: Diagnosis not present

## 2022-07-13 DIAGNOSIS — D62 Acute posthemorrhagic anemia: Secondary | ICD-10-CM

## 2022-07-13 LAB — COMPREHENSIVE METABOLIC PANEL
ALT: 20 U/L (ref 0–35)
AST: 22 U/L (ref 0–37)
Albumin: 4.5 g/dL (ref 3.5–5.2)
Alkaline Phosphatase: 71 U/L (ref 39–117)
BUN: 10 mg/dL (ref 6–23)
CO2: 30 mEq/L (ref 19–32)
Calcium: 9.6 mg/dL (ref 8.4–10.5)
Chloride: 104 mEq/L (ref 96–112)
Creatinine, Ser: 0.61 mg/dL (ref 0.40–1.20)
GFR: 94.36 mL/min (ref >60.00)
Glucose, Bld: 89 mg/dL (ref 70–99)
Potassium: 4 mEq/L (ref 3.5–5.1)
Sodium: 140 mEq/L (ref 135–145)
Total Bilirubin: 0.5 mg/dL (ref 0.2–1.2)
Total Protein: 7 g/dL (ref 6.0–8.3)

## 2022-07-13 LAB — LIPID PANEL
Cholesterol: 172 mg/dL (ref 0–200)
HDL: 75.7 mg/dL (ref >39.00)
LDL Cholesterol: 78 mg/dL (ref 0–99)
NonHDL: 96.46
Total CHOL/HDL Ratio: 2
Triglycerides: 94 mg/dL (ref 0.0–149.0)
VLDL: 18.8 mg/dL (ref 0.0–40.0)

## 2022-07-13 LAB — CBC
HCT: 41.7 % (ref 36.0–46.0)
Hemoglobin: 14 g/dL (ref 12.0–15.0)
MCHC: 33.7 g/dL (ref 30.0–36.0)
MCV: 89.2 fl (ref 78.0–100.0)
Platelets: 214 10*3/uL (ref 150.0–400.0)
RBC: 4.67 Mil/uL (ref 3.87–5.11)
RDW: 13.1 % (ref 11.5–15.5)
WBC: 4.1 10*3/uL (ref 4.0–10.5)

## 2022-07-13 LAB — TSH: TSH: 1.24 u[IU]/mL (ref 0.35–5.50)

## 2022-07-15 LAB — COBALT, SERUM/PLASMA: Cobalt, Serum/Plasma: 4.2 mcg/L — ABNORMAL HIGH (ref 0.1–0.4)

## 2022-07-15 LAB — CHROMIUM LEVEL: Chromium: 13.7 mcg/L — ABNORMAL HIGH (ref <=1.2)

## 2022-07-25 DIAGNOSIS — M25552 Pain in left hip: Secondary | ICD-10-CM | POA: Diagnosis not present

## 2022-07-25 DIAGNOSIS — R2689 Other abnormalities of gait and mobility: Secondary | ICD-10-CM | POA: Diagnosis not present

## 2022-08-01 DIAGNOSIS — M25552 Pain in left hip: Secondary | ICD-10-CM | POA: Diagnosis not present

## 2022-08-01 DIAGNOSIS — R2689 Other abnormalities of gait and mobility: Secondary | ICD-10-CM | POA: Diagnosis not present

## 2022-08-15 DIAGNOSIS — R2689 Other abnormalities of gait and mobility: Secondary | ICD-10-CM | POA: Diagnosis not present

## 2022-08-15 DIAGNOSIS — M25552 Pain in left hip: Secondary | ICD-10-CM | POA: Diagnosis not present

## 2022-08-29 ENCOUNTER — Other Ambulatory Visit (HOSPITAL_COMMUNITY): Payer: Self-pay

## 2022-08-29 MED ORDER — AMOXICILLIN 500 MG PO CAPS
500.0000 mg | ORAL_CAPSULE | ORAL | 1 refills | Status: DC
  Filled 2022-08-29: qty 21, 7d supply, fill #0

## 2022-09-05 DIAGNOSIS — R2689 Other abnormalities of gait and mobility: Secondary | ICD-10-CM | POA: Diagnosis not present

## 2022-09-05 DIAGNOSIS — M25552 Pain in left hip: Secondary | ICD-10-CM | POA: Diagnosis not present

## 2022-09-12 ENCOUNTER — Other Ambulatory Visit (HOSPITAL_COMMUNITY): Payer: Self-pay

## 2022-09-12 ENCOUNTER — Encounter: Payer: Self-pay | Admitting: Family Medicine

## 2022-09-12 ENCOUNTER — Other Ambulatory Visit: Payer: Self-pay | Admitting: Family Medicine

## 2022-09-12 DIAGNOSIS — B351 Tinea unguium: Secondary | ICD-10-CM

## 2022-09-12 DIAGNOSIS — Z1211 Encounter for screening for malignant neoplasm of colon: Secondary | ICD-10-CM

## 2022-09-12 MED ORDER — CICLOPIROX 8 % EX SOLN
Freq: Every day | CUTANEOUS | 5 refills | Status: DC
  Filled 2022-09-12: qty 6.6, 30d supply, fill #0

## 2022-09-12 NOTE — Telephone Encounter (Signed)
Please order patient a cologuard for colon cancer screening. Thanks!

## 2022-09-23 ENCOUNTER — Encounter: Payer: Self-pay | Admitting: Internal Medicine

## 2022-09-26 DIAGNOSIS — R2689 Other abnormalities of gait and mobility: Secondary | ICD-10-CM | POA: Diagnosis not present

## 2022-09-26 DIAGNOSIS — M25552 Pain in left hip: Secondary | ICD-10-CM | POA: Diagnosis not present

## 2022-10-03 DIAGNOSIS — Z1211 Encounter for screening for malignant neoplasm of colon: Secondary | ICD-10-CM | POA: Diagnosis not present

## 2022-10-10 DIAGNOSIS — R2689 Other abnormalities of gait and mobility: Secondary | ICD-10-CM | POA: Diagnosis not present

## 2022-10-10 DIAGNOSIS — M25552 Pain in left hip: Secondary | ICD-10-CM | POA: Diagnosis not present

## 2022-10-12 LAB — COLOGUARD: COLOGUARD: NEGATIVE

## 2022-10-12 NOTE — Progress Notes (Signed)
Cologuard negative, repeat in 3 years

## 2022-10-24 DIAGNOSIS — R2689 Other abnormalities of gait and mobility: Secondary | ICD-10-CM | POA: Diagnosis not present

## 2022-10-24 DIAGNOSIS — M25552 Pain in left hip: Secondary | ICD-10-CM | POA: Diagnosis not present

## 2022-11-21 DIAGNOSIS — Z1231 Encounter for screening mammogram for malignant neoplasm of breast: Secondary | ICD-10-CM | POA: Diagnosis not present

## 2022-11-21 LAB — HM MAMMOGRAPHY

## 2022-11-24 ENCOUNTER — Encounter: Payer: Self-pay | Admitting: Family Medicine

## 2022-12-12 ENCOUNTER — Encounter: Payer: Self-pay | Admitting: Podiatry

## 2022-12-12 ENCOUNTER — Ambulatory Visit (INDEPENDENT_AMBULATORY_CARE_PROVIDER_SITE_OTHER): Payer: PPO

## 2022-12-12 ENCOUNTER — Ambulatory Visit: Payer: PPO | Admitting: Podiatry

## 2022-12-12 VITALS — BP 128/68

## 2022-12-12 DIAGNOSIS — M2061 Acquired deformities of toe(s), unspecified, right foot: Secondary | ICD-10-CM

## 2022-12-12 DIAGNOSIS — L603 Nail dystrophy: Secondary | ICD-10-CM | POA: Diagnosis not present

## 2022-12-12 DIAGNOSIS — M79671 Pain in right foot: Secondary | ICD-10-CM | POA: Diagnosis not present

## 2022-12-12 DIAGNOSIS — M19072 Primary osteoarthritis, left ankle and foot: Secondary | ICD-10-CM | POA: Diagnosis not present

## 2022-12-12 DIAGNOSIS — M19071 Primary osteoarthritis, right ankle and foot: Secondary | ICD-10-CM | POA: Diagnosis not present

## 2022-12-12 DIAGNOSIS — L608 Other nail disorders: Secondary | ICD-10-CM | POA: Diagnosis not present

## 2022-12-12 DIAGNOSIS — M19079 Primary osteoarthritis, unspecified ankle and foot: Secondary | ICD-10-CM

## 2022-12-12 DIAGNOSIS — M79672 Pain in left foot: Secondary | ICD-10-CM | POA: Diagnosis not present

## 2022-12-12 DIAGNOSIS — M2062 Acquired deformities of toe(s), unspecified, left foot: Secondary | ICD-10-CM

## 2022-12-12 NOTE — Progress Notes (Signed)
Subjective:   Patient ID: Isabel [name], female   DOB: 67 y.o.   MRN: [id number]   HPI Chief Complaint  Patient presents with   Nail Problem    Nail fungus and possible corn     Also having support tissues. She had berkenstock inserts then she saw a sports medicine doctor and she had some pmade   The fungus has been ongoing for years. She has been using ciclopirox for 6 months, no improvement. Prior to that they tried to culture it. She has tried OTC medications. The left 4th toenail does hurt.   The corn on the 5th toe    ROS      Objective:  Physical Exam  ***     Assessment:  ***     Plan:  ***

## 2022-12-19 DIAGNOSIS — Z96642 Presence of left artificial hip joint: Secondary | ICD-10-CM | POA: Diagnosis not present

## 2022-12-21 ENCOUNTER — Telehealth: Payer: Self-pay | Admitting: *Deleted

## 2022-12-21 NOTE — Telephone Encounter (Signed)
Patient is calling because her labs were billed to wrong insurance company [facility], no longer have) ,told people up front but was not changed.

## 2022-12-21 NOTE — Telephone Encounter (Signed)
Called patient ,left voice message w/ the name of the labs were for a nail fungus culture which were sent to [facility], and to call back if needing further assistance.

## 2022-12-28 ENCOUNTER — Encounter: Payer: Self-pay | Admitting: Podiatry

## 2023-01-09 ENCOUNTER — Ambulatory Visit: Payer: PPO | Admitting: Podiatry

## 2023-01-30 DIAGNOSIS — Z96642 Presence of left artificial hip joint: Secondary | ICD-10-CM | POA: Diagnosis not present

## 2023-02-06 DIAGNOSIS — D0512 Intraductal carcinoma in situ of left breast: Secondary | ICD-10-CM | POA: Diagnosis not present

## 2023-02-13 DIAGNOSIS — L700 Acne vulgaris: Secondary | ICD-10-CM | POA: Diagnosis not present

## 2023-02-13 DIAGNOSIS — L718 Other rosacea: Secondary | ICD-10-CM | POA: Diagnosis not present

## 2023-02-13 DIAGNOSIS — L821 Other seborrheic keratosis: Secondary | ICD-10-CM | POA: Diagnosis not present

## 2023-02-20 ENCOUNTER — Ambulatory Visit (INDEPENDENT_AMBULATORY_CARE_PROVIDER_SITE_OTHER): Payer: PPO | Admitting: Podiatry

## 2023-02-20 ENCOUNTER — Encounter: Payer: Self-pay | Admitting: Podiatry

## 2023-02-20 DIAGNOSIS — M7742 Metatarsalgia, left foot: Secondary | ICD-10-CM

## 2023-02-20 DIAGNOSIS — M7741 Metatarsalgia, right foot: Secondary | ICD-10-CM | POA: Diagnosis not present

## 2023-02-20 DIAGNOSIS — M19079 Primary osteoarthritis, unspecified ankle and foot: Secondary | ICD-10-CM | POA: Diagnosis not present

## 2023-02-20 DIAGNOSIS — M2062 Acquired deformities of toe(s), unspecified, left foot: Secondary | ICD-10-CM

## 2023-02-20 DIAGNOSIS — R269 Unspecified abnormalities of gait and mobility: Secondary | ICD-10-CM

## 2023-02-20 DIAGNOSIS — Z96642 Presence of left artificial hip joint: Secondary | ICD-10-CM | POA: Diagnosis not present

## 2023-02-20 NOTE — Progress Notes (Unsigned)
Subjective: Chief Complaint  Patient presents with   Foot Pain    Follow up nail fungus/calluses/gait   "I wanted to see if he could add something to my shoes, some kind of insert maybe. I've been using the urea as well."   67 year old female presents the office today with above concerns.  She has difficulty wearing inserts and certain shoes.  I previously had a lateral posterior and existing orthotic.  Orthotics not comfortable so she brings her mother.  She has.  Upon evaluation it looks like when she had inserts it appears they started adding some metatarsal support and lateral post.  She still gets pain to the fifth toe.  Objective: AAO x3, NAD DP/PT pulses palpable bilaterally, CRT less than 3 seconds Bunions are present with adductovarus present of the fifth toe.  She has tenderness submetatarsal 5 as well as the lateral aspect of the foot.  There is no area pinpoint tenderness. No pain with calf compression, swelling, warmth, erythema  Assessment: 67 year old female with metatarsalgia, bunion, hammertoe, gait abnormality  Plan: -All treatment options discussed with the patient including all alternatives, risks, complications.  -I have the lateral posterior orthotic that she brought in.  Continue offloading.  Will send HTA orthotics for pre-determination.  -Monitor for any clinical signs or symptoms of infection and directed to call the office immediately should any occur or go to the ER. -Patient encouraged to call the office with any questions, concerns, change in symptoms.   Trula Slade DPM

## 2023-02-24 ENCOUNTER — Telehealth: Payer: Self-pay | Admitting: Podiatry

## 2023-02-24 NOTE — Telephone Encounter (Signed)
Received prior auth - Approved for orthotics  auth number [id number] - sending documents to scan center

## 2023-03-09 ENCOUNTER — Other Ambulatory Visit (HOSPITAL_COMMUNITY): Payer: Self-pay

## 2023-03-09 DIAGNOSIS — H00024 Hordeolum internum left upper eyelid: Secondary | ICD-10-CM | POA: Diagnosis not present

## 2023-03-09 MED ORDER — MAXITROL 3.5-10000-0.1 OP OINT
1.0000 | TOPICAL_OINTMENT | Freq: Three times a day (TID) | OPHTHALMIC | 2 refills | Status: DC
  Filled 2023-03-09: qty 3.5, 5d supply, fill #0

## 2023-03-09 MED ORDER — DOXYCYCLINE MONOHYDRATE 100 MG PO TABS
100.0000 mg | ORAL_TABLET | Freq: Two times a day (BID) | ORAL | 1 refills | Status: DC
  Filled 2023-03-09: qty 20, 10d supply, fill #0

## 2023-03-20 ENCOUNTER — Ambulatory Visit (INDEPENDENT_AMBULATORY_CARE_PROVIDER_SITE_OTHER): Payer: PPO

## 2023-03-20 DIAGNOSIS — M19079 Primary osteoarthritis, unspecified ankle and foot: Secondary | ICD-10-CM

## 2023-03-20 NOTE — Progress Notes (Signed)
Patient presents today to be casted for custom molded orthotics. Isabel [name] is the treating physician.  Impression foam cast was taken. ABN signed.  Patient info-  Shoe size: 8.5  Shoe style: ATHLETIC  Height: 5FT 7IN  Weight: 145  Insurance: HTA   Patient will be notified once orthotics arrive in office and reappoint for fitting at that time.

## 2023-03-30 ENCOUNTER — Other Ambulatory Visit: Payer: Self-pay | Admitting: Podiatry

## 2023-03-30 DIAGNOSIS — M778 Other enthesopathies, not elsewhere classified: Secondary | ICD-10-CM

## 2023-03-30 DIAGNOSIS — M7741 Metatarsalgia, right foot: Secondary | ICD-10-CM

## 2023-03-30 DIAGNOSIS — M19072 Primary osteoarthritis, left ankle and foot: Secondary | ICD-10-CM

## 2023-04-03 DIAGNOSIS — H00024 Hordeolum internum left upper eyelid: Secondary | ICD-10-CM | POA: Diagnosis not present

## 2023-04-17 ENCOUNTER — Other Ambulatory Visit: Payer: PPO

## 2023-06-12 DIAGNOSIS — L918 Other hypertrophic disorders of the skin: Secondary | ICD-10-CM | POA: Diagnosis not present

## 2023-06-12 DIAGNOSIS — L820 Inflamed seborrheic keratosis: Secondary | ICD-10-CM | POA: Diagnosis not present

## 2023-07-05 ENCOUNTER — Encounter (INDEPENDENT_AMBULATORY_CARE_PROVIDER_SITE_OTHER): Payer: Self-pay

## 2023-07-10 ENCOUNTER — Encounter: Payer: Self-pay | Admitting: Family Medicine

## 2023-07-10 DIAGNOSIS — J636 Pneumoconiosis due to other specified inorganic dusts: Secondary | ICD-10-CM

## 2023-07-10 DIAGNOSIS — R790 Abnormal level of blood mineral: Secondary | ICD-10-CM

## 2023-07-10 DIAGNOSIS — E042 Nontoxic multinodular goiter: Secondary | ICD-10-CM

## 2023-07-10 DIAGNOSIS — Z0000 Encounter for general adult medical examination without abnormal findings: Secondary | ICD-10-CM

## 2023-07-10 DIAGNOSIS — Z13220 Encounter for screening for lipoid disorders: Secondary | ICD-10-CM

## 2023-07-11 NOTE — Progress Notes (Unsigned)
[name] [address] [place] [address] Phone: [phone] Subjective:   Isabel [name], am serving as a scribe for Dr. Antoine Primas.  I'm seeing this patient by the request  of:  Karie Georges, MD  CC: Left knee and foot pain  BJY:[id number]  10/20/2020 Severe overall and will likely need surgical intervention at some point in the future.  Can consider injections at follow-up if needed.  Once again would like work-up of autoimmune at some point if this continues.     Patient does have a mass that seems to be underneath the radial artery.  Likely ganglion but does seem to be intra-articular in the radiocarpal joint area.  Concerned with potential aspiration and I do think it does go deep into the joint.  X-rays ordered today to further evaluate for osteopenia as well.  We discussed different hand surgeons that I think would be beneficial which patient declined at the moment.  Patient also has what is seems to be more of some simple inclusion cyst of the fingers.  Discussed the potential for laboratory work-up with patient having so many different orthopedic problems but patient declined today but we will consider autoimmune work-up at follow-up.  Follow-up again in  8 weeks    Patient continues to have difficulty with the varicosity of the legs which is seems to be what is on ultrasound of the ankle at this time. I think that this is likely contributing. No sign of any type of superficial vein phlebitis at the moment. We discussed compression socks. Patient will follow up with vascular to discuss treatment options. Midfoot arthritis noted but overall doing relatively well.   Updated 07/13/2023 Isabel [name] is a 67 y.o. female coming in with complaint of L knee and foot pain. Had hip revision in January 2023. Pain in patella when kneeling on her knee and if she hits patella she will have sharp pain and electrical like sensation. Works on  concrete and will have lateral knee pain intermittently.   Pain in L foot. Pinky toe seems to be rotating outward. Saw podiatry but does not feel that orthotics are helpful. Pain will radiate up into metatarsal.       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   Breast cancer (HCC)    sees onc and gen surg    Mitral regurgitation    mild on echo from 2009, no MVP per notes   Osteoarthritis    w/ bilat hip resurfacing and possible metal reaction, sees ortho at [facility]   Scoliosis    Uterine fibroid    Varicose veins    goes to [facility], s/p laser and inj tx   Past Surgical History:  Procedure Laterality Date   BREAST LUMPECTOMY  09/12   left breast   GANGLION CYST EXCISION     left hip resurfaced 2011     right hip resurfaced 2008     TOTAL HIP ARTHROPLASTY Left 12/08/2021   Procedure: REVISION OF HIP RESURFACEING TO TOTAL HIP ARTHROPLASTY ANTERIOR APPROACH;  Surgeon: Samson Frederic, MD;  Location: [facility];  Service: Orthopedics;  Laterality: Left;   unterine fibroid  1998   removal   Social History   Socioeconomic History   Marital status: Married    Spouse name: Not on file   Number of children: Not on file   Years of education: Not on file  Highest education level: Bachelor's degree (e.g., BA, AB, BS)  Occupational History   Occupation: Art Director/Artist  Tobacco Use   Smoking status: Never   Smokeless tobacco: Never  Substance and Sexual Activity   Alcohol use: Yes    Alcohol/week: 7.0 standard drinks of alcohol    Types: 7 Glasses of wine per week   Drug use: No   Sexual activity: Yes    Birth control/protection: Post-menopausal  Other Topics Concern   Not on file  Social History Narrative   Updated 08/2015:   Art Director/Artisit - currently unemployed   Married to [employer]   No children   Exercise 2 days per week/ diet healthy   Christian   Social Determinants of Health   Financial Resource  Strain: Low Risk  (03/21/2022)   Overall Financial Resource Strain (CARDIA)    Difficulty of Paying Living Expenses: Not very hard  Food Insecurity: No Food Insecurity (03/21/2022)   Hunger Vital Sign    Worried About Running Out of Food in the Last Year: Never true    Ran Out of Food in the Last Year: Never true  Transportation Needs: No Transportation Needs (03/21/2022)   PRAPARE - Administrator, Civil Service (Medical): No    Lack of Transportation (Non-Medical): No  Physical Activity: Unknown (03/21/2022)   Exercise Vital Sign    Days of Exercise per Week: 0 days    Minutes of Exercise per Session: Not on file  Stress: Stress Concern Present (03/21/2022)   [employer] of Occupational Health - Occupational Stress Questionnaire    Feeling of Stress : To some extent  Social Connections: Socially Isolated (03/21/2022)   Social Connection and Isolation Panel [NHANES]    Frequency of Communication with Friends and Family: Once a week    Frequency of Social Gatherings with Friends and Family: Once a week    Attends Religious Services: Never    Database administrator or Organizations: No    Attends Engineer, structural: Not on file    Marital Status: Married   Allergies  Allergen Reactions   Erythromycin Diarrhea and Nausea Only   Latex     Sores in mouth   Family History  Problem Relation Age of Onset   Stroke Mother 43       hemorrhagic   Other Mother        pagets disease breast   Arthritis Mother        foot, knee problems   Hypertension Father    Hyperlipidemia Father    CAD Father 89       Triple bypass; died age 18   Arthritis Father    Dementia Father    Breast cancer Maternal Aunt    Esophageal cancer Maternal Grandfather 54   Dementia Paternal Grandmother    Heart disease Paternal Grandfather 75       multiple heart attacks   Arthritis Paternal Uncle    Colon cancer Neg Hx    Stomach cancer Neg Hx    Thyroid disease Neg Hx           Current Outpatient Medications (Other):    Ascorbic Acid (VITA-C PO), Take 1,000 mg by mouth in the morning and at bedtime.   B Complex Vitamins (VITAMIN B COMPLEX PO), Take 1 tablet by mouth daily.   BIOTIN 5000 PO, Take 5,000 Units by mouth 2 (two) times daily.   Calcium Citrate 333 MG TABS, Take 333 mg by mouth in  the morning and at bedtime.   Cholecalciferol (VITAMIN D3 PO), Take 2,000 Units by mouth daily.   Multiple Vitamin (MULTI-VITAMIN DAILY PO), Take 1 tablet by mouth daily.   NON FORMULARY, Take 1 tablet by mouth 2 (two) times daily. supplement for liver- milk thistle   OVER THE COUNTER MEDICATION, Take 1 tablet by mouth 2 (two) times daily. Digestive enzyme   Probiotic Product (PROBIOTIC-10 PO), Take 1 tablet by mouth daily.   amoxicillin (AMOXIL) 500 MG capsule, Take 4 capsules by mouth 1-2 hours before dental work.   doxycycline (ADOXA) 100 MG tablet, Take 1 tablet (100 mg total) by mouth 2 (two) times daily.   hydroquinone 4 % cream, Apply 1 application topically 2 (two) times daily as needed (leg scars).   neomycin-polymyxin-dexameth (MAXITROL) 0.1 % OINT, Apply small amount on eyelid 3 times daily.   Reviewed prior external information including notes and imaging from  primary care provider As well as notes that were available from care everywhere and other healthcare systems.  Past medical history, social, surgical and family history all reviewed in electronic medical record.  No pertanent information unless stated regarding to the chief complaint.   Review of Systems:  No headache, visual changes, nausea, vomiting, diarrhea, constipation, dizziness, abdominal pain, skin rash, fevers, chills, night sweats, weight loss, swollen lymph nodes, body aches, joint swelling, chest pain, shortness of breath, mood changes. POSITIVE muscle aches  Objective  Blood pressure 112/82, pulse 71, height 5\' 6"  (1.676 m), weight 151 lb (68.5 kg), last menstrual period  12/05/2012, SpO2 97%.   General: No apparent distress alert and oriented x3 mood and affect normal, dressed appropriately.  HEENT: Pupils equal, extraocular movements intact  Respiratory: Patient's speak in full sentences and does not appear short of breath  Cardiovascular: No lower extremity edema, non tender, no erythema  Mild antalgic gait noted.  Severe tenderness to palpation at the inferior aspect of the patella near the insertion on the patella tendon.  Good range of motion noted.  He does have pain with compression of the patella as well.  No significant swelling.  Mild arthritic changes noted.   Foot exam does show splaying between the fourth and fifth toes on the left foot.  Patient also has hallux limitus noted bilaterally.  Does seem to have significant breakdown of the transverse arch.  Significant rigid midfoot noted as well left greater than right  Limited muscular skeletal ultrasound was performed and interpreted by Antoine Primas, M  Hypoechoic changes noted over the proximal aspect of the patella tendon.  This could be consistent with a partial tear.  Mild increase in Doppler flow and what appears to be some fat pad irritation with increasing hypoechoic changes.  Does have some narrowing at the patellofemoral joint and the medial joint space. Impression: Mild underlying arthritis with potential healing patellar tendon injury    Impression and Recommendations:     The above documentation has been reviewed and is accurate and complete Judi Saa, DO

## 2023-07-13 ENCOUNTER — Ambulatory Visit: Payer: PPO | Admitting: Family Medicine

## 2023-07-13 ENCOUNTER — Other Ambulatory Visit: Payer: Self-pay

## 2023-07-13 ENCOUNTER — Encounter: Payer: Self-pay | Admitting: Family Medicine

## 2023-07-13 ENCOUNTER — Ambulatory Visit (INDEPENDENT_AMBULATORY_CARE_PROVIDER_SITE_OTHER): Payer: PPO

## 2023-07-13 VITALS — BP 112/82 | HR 71 | Ht 66.0 in | Wt 151.0 lb

## 2023-07-13 DIAGNOSIS — G8929 Other chronic pain: Secondary | ICD-10-CM | POA: Diagnosis not present

## 2023-07-13 DIAGNOSIS — M19072 Primary osteoarthritis, left ankle and foot: Secondary | ICD-10-CM

## 2023-07-13 DIAGNOSIS — M19071 Primary osteoarthritis, right ankle and foot: Secondary | ICD-10-CM | POA: Diagnosis not present

## 2023-07-13 DIAGNOSIS — M25562 Pain in left knee: Secondary | ICD-10-CM

## 2023-07-13 DIAGNOSIS — M1712 Unilateral primary osteoarthritis, left knee: Secondary | ICD-10-CM | POA: Diagnosis not present

## 2023-07-13 NOTE — Patient Instructions (Signed)
[facility] total support or can consider custom  You can ask them to add metatarsal pad Chopat or patellar strap daily for one week  Then only with wo for 6 weeks PF exercises Xray today See me in 2-3 months

## 2023-07-13 NOTE — Assessment & Plan Note (Signed)
The ultrasound there is a current hypoechoic changes that is consistent with possible small injury to the proximal aspect of the patella tendon.  Discussed patella strap.  Does have some underlying arthritic changes we will further evaluate with x-ray.  We discussed VMO strengthening, hamstring strengthening and given home exercises as well.  We discussed potential bracing but will see how patient does with the patellar strap initially.  We discussed also patient with orthotics and what could be potentially helpful.  Follow-up again in 6 to 8 weeks

## 2023-07-13 NOTE — Assessment & Plan Note (Signed)
Known arthritis of the midfoot as well as breakdown of the transverse arch.  Patient is having splaying between the fourth and fifth toes on the left side that is contributing to more discomfort and pain.  Discussed home exercises and icing regimen.  Increase activity slowly otherwise.  Follow-up with me again in 6 to 8 weeks

## 2023-07-17 NOTE — Telephone Encounter (Signed)
I went ahead and placed the orders for these as well as her annual labs

## 2023-07-17 NOTE — Telephone Encounter (Signed)
Checking on progress of this request. Patient has appointment on 07/24/23. Pt requesting a call to confirm

## 2023-07-24 ENCOUNTER — Ambulatory Visit (INDEPENDENT_AMBULATORY_CARE_PROVIDER_SITE_OTHER): Payer: PPO | Admitting: Family Medicine

## 2023-07-24 ENCOUNTER — Ambulatory Visit: Payer: PPO

## 2023-07-24 ENCOUNTER — Encounter: Payer: Self-pay | Admitting: Family Medicine

## 2023-07-24 VITALS — BP 122/82 | HR 64 | Temp 98.4°F | Ht 66.75 in | Wt 150.6 lb

## 2023-07-24 DIAGNOSIS — E042 Nontoxic multinodular goiter: Secondary | ICD-10-CM | POA: Diagnosis not present

## 2023-07-24 DIAGNOSIS — Z13220 Encounter for screening for lipoid disorders: Secondary | ICD-10-CM | POA: Diagnosis not present

## 2023-07-24 DIAGNOSIS — M159 Polyosteoarthritis, unspecified: Secondary | ICD-10-CM | POA: Diagnosis not present

## 2023-07-24 DIAGNOSIS — J636 Pneumoconiosis due to other specified inorganic dusts: Secondary | ICD-10-CM

## 2023-07-24 DIAGNOSIS — R790 Abnormal level of blood mineral: Secondary | ICD-10-CM

## 2023-07-24 DIAGNOSIS — R635 Abnormal weight gain: Secondary | ICD-10-CM

## 2023-07-24 DIAGNOSIS — Z0000 Encounter for general adult medical examination without abnormal findings: Secondary | ICD-10-CM | POA: Diagnosis not present

## 2023-07-24 LAB — CBC
HCT: 43.2 % (ref 36.0–46.0)
Hemoglobin: 14.5 g/dL (ref 12.0–15.0)
MCHC: 33.5 g/dL (ref 30.0–36.0)
MCV: 90.8 fl (ref 78.0–100.0)
Platelets: 231 10*3/uL (ref 150.0–400.0)
RBC: 4.76 Mil/uL (ref 3.87–5.11)
RDW: 12.8 % (ref 11.5–15.5)
WBC: 5.5 10*3/uL (ref 4.0–10.5)

## 2023-07-24 LAB — COMPREHENSIVE METABOLIC PANEL
ALT: 21 U/L (ref 0–35)
AST: 23 U/L (ref 0–37)
Albumin: 4.7 g/dL (ref 3.5–5.2)
Alkaline Phosphatase: 69 U/L (ref 39–117)
BUN: 12 mg/dL (ref 6–23)
CO2: 26 mEq/L (ref 19–32)
Calcium: 10 mg/dL (ref 8.4–10.5)
Chloride: 103 mEq/L (ref 96–112)
Creatinine, Ser: 0.64 mg/dL (ref 0.40–1.20)
GFR: 92.6 mL/min (ref >60.00)
Glucose, Bld: 103 mg/dL — ABNORMAL HIGH (ref 70–99)
Potassium: 4.7 mEq/L (ref 3.5–5.1)
Sodium: 140 mEq/L (ref 135–145)
Total Bilirubin: 0.6 mg/dL (ref 0.2–1.2)
Total Protein: 7.2 g/dL (ref 6.0–8.3)

## 2023-07-24 LAB — LIPID PANEL
Cholesterol: 191 mg/dL (ref 0–200)
HDL: 77.2 mg/dL (ref >39.00)
LDL Cholesterol: 99 mg/dL (ref 0–99)
NonHDL: 114.11
Total CHOL/HDL Ratio: 2
Triglycerides: 78 mg/dL (ref 0.0–149.0)
VLDL: 15.6 mg/dL (ref 0.0–40.0)

## 2023-07-24 LAB — HEMOGLOBIN A1C: Hgb A1c MFr Bld: 5.8 % (ref 4.6–6.5)

## 2023-07-24 LAB — TSH: TSH: 0.86 u[IU]/mL (ref 0.35–5.50)

## 2023-07-24 NOTE — Patient Instructions (Signed)

## 2023-07-24 NOTE — Progress Notes (Unsigned)
Patient present today with concerns of inserts not giving enough MT pressure relief. Also lateral posting is needed to reduce supination  I added dancers pads to her sandals today to see if this offload is better for her mid-foot  Patient has a pair of berk's UCB style orthotic that she states helps her most it has MT pad, and leather top but is MT length and needs to be 3/4 length  Patient will call in few days or send message letting me know if dancers pads help if so I will send her orthotics back for re-make  Adding lateral posting 2degrees, MT bars and or Large MT pads depending on what patient says, and retop in leather   Addison Bailey CPed, CFo, CFm

## 2023-07-24 NOTE — Progress Notes (Signed)
Complete physical exam  Patient: Isabel [name]   DOB: 1956-09-16   67 y.o. Female  MRN: [id number]  Subjective:    Chief Complaint  Patient presents with   Annual Exam    Isabel [name] is a 67 y.o. female who presents today for a complete physical exam. She reports consuming a general diet. Gym/ health club routine includes high impact aerobics  and twice a week plus light weights. She generally feels well. She reports sleeping fairly well. She does not have additional problems to discuss today.    Most recent fall risk assessment:    07/24/2023    8:04 AM  Fall Risk   Falls in the past year? 0  Number falls in past yr: 0  Injury with Fall? 0  Risk for fall due to : No Fall Risks  Follow up Falls evaluation completed     Most recent depression screenings:    07/24/2023    9:07 AM 07/11/2022   11:37 AM  PHQ 2/9 Scores  PHQ - 2 Score 1 1  PHQ- 9 Score 2 3    Vision:has an eye doctor, wears contacts, no current issues and Dental: No current dental problems and Receives regular dental care  Patient Active Problem List   Diagnosis Date Noted   Left anterior knee pain 07/13/2023   Onychomycosis of toenail 07/11/2022   Postoperative anemia due to acute blood loss 12/09/2021   Fever 12/05/2021   Acute lower UTI 12/05/2021   Left hip pain 12/03/2021   Periprosthetic fracture around internal prosthetic hip joint    Closed left hip fracture (HCC) 12/02/2021   Mass of joint of right wrist 10/20/2020   Arthritis of carpometacarpal [facility]) joint of right thumb 10/20/2020   Synovitis of toe 06/21/2019   Arthritis of midfoot 06/21/2019   Pain in hip region after total hip replacement (HCC) 06/05/2018   Multinodular goiter 01/11/2017   Varicosities of leg 08/07/2015   Osteoarthritis 08/07/2015   History of bilateral hip replacements 12/29/2011   Malignant neoplasm of upper-outer quadrant of left female breast (HCC) 10/13/2011      Patient Care Team: Karie Georges, MD as PCP - General (Family Medicine) Olivia Mackie, MD as Consulting Physician (Obstetrics and Gynecology) Lurline Hare, MD as Consulting Physician (Radiation Oncology) Tillman Sers, MD as Consulting Physician (Orthopedic Surgery) Ovidio Kin, MD (General Surgery) Drue Second, MD as Consulting Physician (Hematology and Oncology)   Outpatient Medications Prior to Visit  Medication Sig   Ascorbic Acid (VITA-C PO) Take 1,000 mg by mouth in the morning and at bedtime.   B Complex Vitamins (VITAMIN B COMPLEX PO) Take 1 tablet by mouth daily.   BIOTIN 5000 PO Take 5,000 Units by mouth 2 (two) times daily.   Calcium Citrate 333 MG TABS Take 333 mg by mouth in the morning and at bedtime.   Cholecalciferol (VITAMIN D3 PO) Take 2,000 Units by mouth daily.   Multiple Vitamin (MULTI-VITAMIN DAILY PO) Take 1 tablet by mouth daily.   NON FORMULARY Take 1 tablet by mouth 2 (two) times daily. supplement for liver- milk thistle   OVER THE COUNTER MEDICATION Take 1 tablet by mouth 2 (two) times daily. Digestive enzyme   Probiotic Product (PROBIOTIC-10 PO) Take 1 tablet by mouth daily.   [DISCONTINUED] amoxicillin (AMOXIL) 500 MG capsule Take 4 capsules by mouth 1-2 hours before dental work.   [DISCONTINUED] doxycycline (ADOXA) 100 MG tablet Take 1 tablet (100 mg total) by mouth 2 (two)  times daily.   [DISCONTINUED] hydroquinone 4 % cream Apply 1 application topically 2 (two) times daily as needed (leg scars).   [DISCONTINUED] neomycin-polymyxin-dexameth (MAXITROL) 0.1 % OINT Apply small amount on eyelid 3 times daily.   No facility-administered medications prior to visit.    Review of Systems  HENT:  Negative for hearing loss.   Eyes:  Negative for blurred vision.  Respiratory:  Negative for shortness of breath.   Cardiovascular:  Negative for chest pain.  Gastrointestinal: Negative.   Genitourinary: Negative.   Musculoskeletal:  Negative for back pain.  Neurological:  Negative  for headaches.  Psychiatric/Behavioral:  Negative for depression.   All other systems reviewed and are negative.      Objective:     BP 122/82 (BP Location: Left Arm, Patient Position: Sitting, Cuff Size: Normal)   Pulse 64   Temp 98.4 F (36.9 C) (Oral)   Ht 5' 6.75" (1.695 m)   Wt 150 lb 9.6 oz (68.3 kg)   LMP 12/05/2012   SpO2 98%   BMI 23.76 kg/m    Physical Exam Vitals reviewed.  Constitutional:      Appearance: Normal appearance. She is well-groomed and normal weight.  HENT:     Right Ear: Tympanic membrane and ear canal normal.     Left Ear: Tympanic membrane and ear canal normal.     Mouth/Throat:     Mouth: Mucous membranes are moist.     Pharynx: No posterior oropharyngeal erythema.  Eyes:     Conjunctiva/sclera: Conjunctivae normal.  Neck:     Thyroid: No thyromegaly.  Cardiovascular:     Rate and Rhythm: Normal rate and regular rhythm.     Pulses: Normal pulses.     Heart sounds: S1 normal and S2 normal.  Pulmonary:     Effort: Pulmonary effort is normal.     Breath sounds: Normal breath sounds and air entry.  Abdominal:     General: Abdomen is flat. Bowel sounds are normal.     Palpations: Abdomen is soft.  Musculoskeletal:     Right lower leg: No edema.     Left lower leg: No edema.  Lymphadenopathy:     Cervical: No cervical adenopathy.  Neurological:     Mental Status: She is alert and oriented to person, place, and time. Mental status is at baseline.     Gait: Gait is intact.  Psychiatric:        Mood and Affect: Mood and affect normal.        Speech: Speech normal.        Behavior: Behavior normal.        Judgment: Judgment normal.          Assessment & Plan:    Routine Health Maintenance and Physical Exam  Immunization History  Administered Date(s) Administered   PFIZER(Purple Top)SARS-COV-2 Vaccination 03/06/2020, 03/27/2020, 11/04/2020    Health Maintenance  Topic Date Due   Medicare Annual Wellness (AWV)  Never done    DTaP/Tdap/Td (1 - Tdap) Never done   Zoster Vaccines- Shingrix (1 of 2) Never done   Pneumonia Vaccine 76+ Years old (1 of 1 - PCV) Never done   INFLUENZA VACCINE  07/06/2023   COVID-19 Vaccine (4 - 2023-24 season) 08/09/2023 (Originally 08/05/2022)   MAMMOGRAM  11/22/2023   PAP SMEAR-Modifier  03/25/2024   Fecal DNA (Cologuard)  10/03/2025   DEXA SCAN  Completed   Hepatitis C Screening  Completed   HIV Screening  Completed   HPV  VACCINES  Aged Out   Colonoscopy  Discontinued    Discussed health benefits of physical activity, and encouraged her to engage in regular exercise appropriate for her age and condition.  Routine general medical examination at a health care facility  -- Normal physical exam findings today. Pt is reporting some weight gain since last year. Will be checking her annual labs today including A1C, TSH, CBC and CMP. Patient also has significant arthritis on exam. Will check rheum factor and ANA. Handouts given on healthy eating and exercise. Counseled patient about healthy sleep habits.    Weight gain -     Hemoglobin A1c; Future  Osteoarthritis of multiple joints, unspecified osteoarthritis type -     ANA w/Reflex; Future -     Rheumatoid factor; Future  Abnormal blood level of chromium -     Chromium and Cobalt, WB (MoM)  Cobaltosis (HCC) -     Chromium and Cobalt, WB (MoM)  Multinodular goiter -     TSH  Preventative health care -     CBC -     Comprehensive metabolic panel  Lipid screening -     Lipid panel    Return in 1 year (on 07/23/2024).     Karie Georges, MD

## 2023-07-25 LAB — RHEUMATOID FACTOR: Rheumatoid fact SerPl-aCnc: <10 [IU]/mL (ref <14)

## 2023-07-26 ENCOUNTER — Encounter: Payer: Self-pay | Admitting: Family Medicine

## 2023-07-27 LAB — CHROMIUM AND COBALT, WB (MOM)
Chromium: 12.1 ng/mL — ABNORMAL HIGH (ref <3.0)
Cobalt: 1.8 ng/mL (ref <3.0)

## 2023-07-27 LAB — ANA W/REFLEX: Anti Nuclear Antibody (ANA): NEGATIVE

## 2023-08-01 ENCOUNTER — Telehealth: Payer: Self-pay | Admitting: Podiatry

## 2023-08-01 NOTE — Telephone Encounter (Signed)
Pt called and met pad has helped  pt in orthotic and needs a little support on outside as well and if you could please call pt to let her know you have received this message and discuss the order . She left one alast week as well but did not hear back.

## 2023-08-28 DIAGNOSIS — Z96642 Presence of left artificial hip joint: Secondary | ICD-10-CM | POA: Diagnosis not present

## 2023-08-28 DIAGNOSIS — Z471 Aftercare following joint replacement surgery: Secondary | ICD-10-CM | POA: Diagnosis not present

## 2023-08-28 DIAGNOSIS — M1712 Unilateral primary osteoarthritis, left knee: Secondary | ICD-10-CM | POA: Diagnosis not present

## 2023-09-04 ENCOUNTER — Ambulatory Visit: Payer: PPO | Admitting: Podiatry

## 2023-09-04 DIAGNOSIS — M19072 Primary osteoarthritis, left ankle and foot: Secondary | ICD-10-CM

## 2023-09-04 DIAGNOSIS — M778 Other enthesopathies, not elsewhere classified: Secondary | ICD-10-CM

## 2023-09-04 DIAGNOSIS — L603 Nail dystrophy: Secondary | ICD-10-CM

## 2023-09-04 DIAGNOSIS — M19071 Primary osteoarthritis, right ankle and foot: Secondary | ICD-10-CM | POA: Diagnosis not present

## 2023-09-04 NOTE — Progress Notes (Signed)
Patient presents today to pick up custom molded foot orthotics, diagnosed with arthritis mid-foot by Dr. Ardelle Anton.   Orthotics were dispensed and fit was satisfactory. Reviewed instructions for break-in and wear. Written instructions given to patient.  Patient will follow up as needed.   Addison Bailey Cped, CFo, CFm   Received prior auth - Approved for orthotics  auth number [id number] Berkley Harvey was received in March 2024

## 2023-09-04 NOTE — Patient Instructions (Signed)
I have ordered a medication for you that will come from [facility] in [place]. They should be calling you to verify insurance and will mail the medication to you. If you live close by then you can go by their pharmacy to pick up the medication. Their phone number is [phone]. If you do not hear from them in the next few days, please give us a call at [phone].   

## 2023-09-04 NOTE — Progress Notes (Signed)
Subjective: Chief Complaint  Patient presents with   Nail Problem    Nail fungus    67 year old female presents the office today with above concerns.  She was previously on Penlac and she had not seen improvement and culture was negative she been using urea nail gel which helps somewhat but the nail still thickened discolored.  She also presented to pick up orthotics as she has not heard about them.  Objective: AAO x3, NAD DP/PT pulses palpable bilaterally, CRT less than 3 seconds Nails are still dystrophic, hypertrophic with discoloration.  No hyperpigmentation.  No edema, erythema or signs of infection.  No open lesions otherwise.  No other areas of pinpoint temp at this time. No pain with calf compression, swelling, warmth, erythema  Assessment: Onychodystrophy, onychomycosis  Plan: -All treatment options discussed with the patient including all alternatives, risks, complications.  -I [facility] to help with the nails and I did fax this myself today. -She was seen by [place] today orthotic dispensing.  Break instructions provided.  If no improvement in the modifications for me know. -Patient encouraged to call the office with any questions, concerns, change in symptoms.   Vivi Barrack DPM

## 2023-09-25 ENCOUNTER — Ambulatory Visit: Payer: PPO | Admitting: Family Medicine

## 2023-10-05 ENCOUNTER — Other Ambulatory Visit (HOSPITAL_COMMUNITY): Payer: Self-pay

## 2023-10-05 DIAGNOSIS — H15122 Nodular episcleritis, left eye: Secondary | ICD-10-CM | POA: Diagnosis not present

## 2023-10-05 MED ORDER — PREDNISOLONE ACETATE 1 % OP SUSP
OPHTHALMIC | 1 refills | Status: DC
  Filled 2023-10-05: qty 10, 30d supply, fill #0

## 2023-10-13 DIAGNOSIS — H15122 Nodular episcleritis, left eye: Secondary | ICD-10-CM | POA: Diagnosis not present

## 2023-10-24 ENCOUNTER — Encounter: Payer: Self-pay | Admitting: Podiatry

## 2023-11-13 ENCOUNTER — Other Ambulatory Visit: Payer: PPO

## 2023-11-27 ENCOUNTER — Ambulatory Visit: Payer: PPO

## 2023-11-27 NOTE — Progress Notes (Signed)
Orthotics do not have enough lateral posting  I added lateral posting today  If patient returns it will be to send back and add MT bar and remove MT pads  Addison Bailey Cped CFo, CFm

## 2023-12-04 DIAGNOSIS — Z1231 Encounter for screening mammogram for malignant neoplasm of breast: Secondary | ICD-10-CM | POA: Diagnosis not present

## 2023-12-04 LAB — HM MAMMOGRAPHY

## 2023-12-05 ENCOUNTER — Encounter: Payer: Self-pay | Admitting: Family Medicine

## 2024-01-18 NOTE — Progress Notes (Signed)
 [name] [address] [place] [address] Phone: [phone] Subjective:   Isabel [name], am serving as a scribe for Dr. Antoine Primas.  I'm seeing this patient by the request  of:  Karie Georges, MD  CC: left knee pain   [id number]  07/13/2023 Known arthritis of the midfoot as well as breakdown of the transverse arch.  Patient is having splaying between the fourth and fifth toes on the left side that is contributing to more discomfort and pain.  Discussed home exercises and icing regimen.  Increase activity slowly otherwise.  Follow-up with me again in 6 to 8 weeks     The ultrasound there is a current hypoechoic changes that is consistent with possible small injury to the proximal aspect of the patella tendon.  Discussed patella strap.  Does have some underlying arthritic changes we will further evaluate with x-ray.  We discussed VMO strengthening, hamstring strengthening and given home exercises as well.  We discussed potential bracing but will see how patient does with the patellar strap initially.  We discussed also patient with orthotics and what could be potentially helpful.  Follow-up again in 6 to 8 weeks      Update 01/23/2024 Isabel [name] is a 68 y.o. female coming in with complaint of L knee pain. Patient states that 2 weeks ago her knee started to swell after taking a class from a different instructor. Also has orthotics and wonders if they are working against her. Cause pain in L foot. Pain in mornings when she first got out of bed. Pain is improving. Pain in anterior and posterior aspects. Full flexion would alleviate her pain so she would sit on bed and pull knee into chest. Patient took off a week from exercising but went back to use machines and stationary bike.        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   Breast cancer (HCC)    sees onc and gen surg    Mitral  regurgitation    mild on echo from 2009, no MVP per notes   Osteoarthritis    w/ bilat hip resurfacing and possible metal reaction, sees ortho at [facility]   Scoliosis    Uterine fibroid    Varicose veins    goes to [facility], s/p laser and inj tx   Past Surgical History:  Procedure Laterality Date   BREAST LUMPECTOMY  09/12   left breast   GANGLION CYST EXCISION     left hip resurfaced 2011     right hip resurfaced 2008     TOTAL HIP ARTHROPLASTY Left 12/08/2021   Procedure: REVISION OF HIP RESURFACEING TO TOTAL HIP ARTHROPLASTY ANTERIOR APPROACH;  Surgeon: Samson Frederic, MD;  Location: [facility];  Service: Orthopedics;  Laterality: Left;   unterine fibroid  1998   removal   Social History   Socioeconomic History   Marital status: Married    Spouse name: Not on file   Number of children: Not on file   Years of education: Not on file   Highest education level: Bachelor's degree (e.g., BA, AB, BS)  Occupational History   Occupation: Art Director/Artist  Tobacco Use   Smoking status: Never   Smokeless tobacco: Never  Substance and Sexual Activity   Alcohol use: Yes    Alcohol/week: 7.0 standard drinks of alcohol    Types: 7 Glasses of wine per week  Drug use: No   Sexual activity: Yes    Birth control/protection: Post-menopausal  Other Topics Concern   Not on file  Social History Narrative   Updated 08/2015:   Art Director/Artisit - currently unemployed   Married to [employer]   No children   Exercise 2 days per week/ diet healthy   Christian   Social Drivers of Health   Financial Resource Strain: Low Risk  (03/21/2022)   Overall Financial Resource Strain (CARDIA)    Difficulty of Paying Living Expenses: Not very hard  Food Insecurity: No Food Insecurity (03/21/2022)   Hunger Vital Sign    Worried About Running Out of Food in the Last Year: Never true    Ran Out of Food in the Last Year: Never true  Transportation Needs: No Transportation Needs (03/21/2022)    PRAPARE - Administrator, Civil Service (Medical): No    Lack of Transportation (Non-Medical): No  Physical Activity: Unknown (03/21/2022)   Exercise Vital Sign    Days of Exercise per Week: 0 days    Minutes of Exercise per Session: Not on file  Stress: Stress Concern Present (03/21/2022)   [employer] of Occupational Health - Occupational Stress Questionnaire    Feeling of Stress : To some extent  Social Connections: Socially Isolated (03/21/2022)   Social Connection and Isolation Panel [NHANES]    Frequency of Communication with Friends and Family: Once a week    Frequency of Social Gatherings with Friends and Family: Once a week    Attends Religious Services: Never    Database administrator or Organizations: No    Attends Engineer, structural: Not on file    Marital Status: Married   Allergies  Allergen Reactions   Erythromycin Diarrhea and Nausea Only   Latex     Sores in mouth   Family History  Problem Relation Age of Onset   Stroke Mother 46       hemorrhagic   Other Mother        pagets disease breast   Arthritis Mother        foot, knee problems   Hypertension Father    Hyperlipidemia Father    CAD Father 65       Triple bypass; died age 44   Arthritis Father    Dementia Father    Breast cancer Maternal Aunt    Esophageal cancer Maternal Grandfather 20   Dementia Paternal Grandmother    Heart disease Paternal Grandfather 34       multiple heart attacks   Arthritis Paternal Uncle    Colon cancer Neg Hx    Stomach cancer Neg Hx    Thyroid disease Neg Hx          Current Outpatient Medications (Other):    Ascorbic Acid (VITA-C PO), Take 1,000 mg by mouth in the morning and at bedtime.   B Complex Vitamins (VITAMIN B COMPLEX PO), Take 1 tablet by mouth daily.   BIOTIN 5000 PO, Take 5,000 Units by mouth 2 (two) times daily.   Calcium Citrate 333 MG TABS, Take 333 mg by mouth in the morning and at bedtime.   Cholecalciferol  (VITAMIN D3 PO), Take 2,000 Units by mouth daily.   Multiple Vitamin (MULTI-VITAMIN DAILY PO), Take 1 tablet by mouth daily.   OVER THE COUNTER MEDICATION, Take 1 tablet by mouth 2 (two) times daily. Digestive enzyme   prednisoLONE acetate (PRED FORTE) 1 % ophthalmic suspension, Instill 1 drop into  left eye 6 times a day --follow drop schedule provided in clinic.   Probiotic Product (PROBIOTIC-10 PO), Take 1 tablet by mouth daily.   Reviewed prior external information including notes and imaging from  primary care provider As well as notes that were available from care everywhere and other healthcare systems.  Past medical history, social, surgical and family history all reviewed in electronic medical record.  No pertanent information unless stated regarding to the chief complaint.   Review of Systems:  No headache, visual changes, nausea, vomiting, diarrhea, constipation, dizziness, abdominal pain, skin rash, fevers, chills, night sweats, weight loss, swollen lymph nodes, body aches, joint swelling, chest pain, shortness of breath, mood changes. POSITIVE muscle aches  Objective  Blood pressure 102/68, pulse 70, height 5\' 6"  (1.676 m), weight 143 lb (64.9 kg), last menstrual period 12/05/2012, SpO2 99%.   General: No apparent distress alert and oriented x3 mood and affect normal, dressed appropriately.  HEENT: Pupils equal, extraocular movements intact  Respiratory: Patient's speak in full sentences and does not appear short of breath  Cardiovascular: No lower extremity edema, non tender, no erythema  Left knee exam shows arthritic changes noted.  Antalgic gait noted.  Patient does have trace effusion noted.  Limited muscular skeletal ultrasound was performed and interpreted by Antoine Primas, M  Limited ultrasound shows fairly significant narrowing of the patellofemoral joint as well as the medial joint space.  Patient does have what appears to be an acute injury noted of the MCL.  Small  grade tear noted.  Does have what appears to be a synovitis in the patellofemoral joint.  Ruptured Baker's cyst noted in the posterior medial aspect. Impression: Knee arthritis with trace effusion.  Ruptured Baker's cyst and likely small MCL tear.    Impression and Recommendations:    The above documentation has been reviewed and is accurate and complete Judi Saa, DO

## 2024-01-23 ENCOUNTER — Ambulatory Visit: Payer: PPO | Admitting: Family Medicine

## 2024-01-23 ENCOUNTER — Other Ambulatory Visit: Payer: Self-pay

## 2024-01-23 ENCOUNTER — Ambulatory Visit (INDEPENDENT_AMBULATORY_CARE_PROVIDER_SITE_OTHER): Payer: PPO

## 2024-01-23 ENCOUNTER — Encounter: Payer: Self-pay | Admitting: Family Medicine

## 2024-01-23 VITALS — BP 102/68 | HR 70 | Ht 66.0 in | Wt 143.0 lb

## 2024-01-23 DIAGNOSIS — M25562 Pain in left knee: Secondary | ICD-10-CM

## 2024-01-23 DIAGNOSIS — M19072 Primary osteoarthritis, left ankle and foot: Secondary | ICD-10-CM | POA: Diagnosis not present

## 2024-01-23 DIAGNOSIS — M19071 Primary osteoarthritis, right ankle and foot: Secondary | ICD-10-CM | POA: Diagnosis not present

## 2024-01-23 DIAGNOSIS — M9702XD Periprosthetic fracture around internal prosthetic left hip joint, subsequent encounter: Secondary | ICD-10-CM

## 2024-01-23 DIAGNOSIS — M1712 Unilateral primary osteoarthritis, left knee: Secondary | ICD-10-CM | POA: Diagnosis not present

## 2024-01-23 NOTE — Patient Instructions (Signed)
 You have 14 days to return or exchange your brace Call [phone], then return the brace to our office Xray today See you again in 5 weeks

## 2024-01-23 NOTE — Assessment & Plan Note (Signed)
 Having another individual work on orthosis.

## 2024-01-23 NOTE — Assessment & Plan Note (Addendum)
 Arthritic changes noted.  Will get repeat x-rays.  Will continue to work on [employer].  Do think that the amount of arthritic changes could be contributing especially with patient compensating for the fracture around the prosthetic hip.  Had to change her gait that I think is contributing.  We discussed that this seems to be multifactorial with a synovitis, arthritis, as well as a potentially ruptured Baker's cyst.  Patient wants to hold on any type of injection at the moment.  Did give a hinged brace for stability.  Follow-up with me again in 6-8 weeks.

## 2024-02-10 ENCOUNTER — Encounter: Payer: Self-pay | Admitting: Family Medicine

## 2024-02-19 ENCOUNTER — Encounter: Payer: Self-pay | Admitting: Podiatry

## 2024-02-19 ENCOUNTER — Ambulatory Visit: Payer: PPO | Admitting: Podiatry

## 2024-02-19 DIAGNOSIS — L603 Nail dystrophy: Secondary | ICD-10-CM

## 2024-02-19 DIAGNOSIS — M2061 Acquired deformities of toe(s), unspecified, right foot: Secondary | ICD-10-CM | POA: Diagnosis not present

## 2024-02-19 DIAGNOSIS — L84 Corns and callosities: Secondary | ICD-10-CM | POA: Diagnosis not present

## 2024-02-21 NOTE — Progress Notes (Signed)
 Subjective: Chief Complaint  Patient presents with   Nail Problem    RM#14 Patient states having nail issues on left foot little toe being treated at home for over 1 year no results.Orthotics issues as well.   68 year old female presents the office above concerns.  She states that she been using the topical medication now for a year on her toenails and not see much improvement.  She also gets pain to her left fifth toe where she gets a corn or redness to the fourth toe.  She feels that she is orthotic modifications and metatarsal support to be brought closer.  No recent injuries otherwise.  Objective: AAO x3, NAD DP/PT pulses palpable bilaterally, CRT less than 3 seconds MS continue to be hypertrophic, dystrophic and brittle with some old debris present.  There is no edema, erythema or signs of infection of the toenail sites.  There is a hyperkeratotic lesion on the medial aspect of the fifth toe on the left foot without any underlying ulceration, drainage or signs of infection today.  Digital deformity is present.  No area pinpoint tenderness. No pain with calf compression, swelling, warmth, erythema  Assessment: 68 year old female with history of vomiting, hyperkeratotic lesion; onychodystrophy/onychomycosis  Plan: -All treatment options discussed with the patient including all alternatives, risks, complications.  -We discussed treatment options for nail fungus.  We discussed oral, topical as well as alternative treatments including laser therapy.  She would like to have considering proceed with laser as well.  I stated precautions eye protection the nails were later today to any complications.  She tolerated well.  She may continue topical medication for now but discussed that if symptoms continue is also switched to oral medication. -Debrided hyperkeratotic lesion patella any complications or bleeding.  Discussed offloading.  May try walking shoe modifications.  In the future if symptoms  persist may need to consider surgical invention to straighten the digit.  Good shoes with excess pressure to the toe. -Patient encouraged to call the office with any questions, concerns, change in symptoms.   Vivi Barrack DPM

## 2024-02-26 ENCOUNTER — Other Ambulatory Visit (HOSPITAL_COMMUNITY): Payer: Self-pay

## 2024-02-26 ENCOUNTER — Ambulatory Visit (INDEPENDENT_AMBULATORY_CARE_PROVIDER_SITE_OTHER): Payer: PPO | Admitting: Family Medicine

## 2024-02-26 ENCOUNTER — Other Ambulatory Visit: Payer: Self-pay

## 2024-02-26 VITALS — BP 118/82 | HR 90 | Ht 66.0 in

## 2024-02-26 DIAGNOSIS — I8390 Asymptomatic varicose veins of unspecified lower extremity: Secondary | ICD-10-CM | POA: Diagnosis not present

## 2024-02-26 DIAGNOSIS — M1712 Unilateral primary osteoarthritis, left knee: Secondary | ICD-10-CM | POA: Diagnosis not present

## 2024-02-26 DIAGNOSIS — M25562 Pain in left knee: Secondary | ICD-10-CM | POA: Diagnosis not present

## 2024-02-26 MED ORDER — DOXYCYCLINE HYCLATE 100 MG PO TABS
100.0000 mg | ORAL_TABLET | Freq: Two times a day (BID) | ORAL | 0 refills | Status: DC
  Filled 2024-02-26: qty 20, 10d supply, fill #0

## 2024-02-26 NOTE — Patient Instructions (Signed)
 Doxy 2x a day for 10 days Vascular referral: vericose vein Gel approval See me again in 6 weeks

## 2024-02-26 NOTE — Progress Notes (Unsigned)
 [name] [address] [place] [address] Phone: [phone] Subjective:   Isabel [name], am serving as a scribe for Dr. Antoine Primas.  I'm seeing this patient by the request  of:  Karie Georges, MD  CC: Left leg pain  [id number]  01/23/2024 Having another individual work on orthosis.    Arthritic changes noted. Will get repeat x-rays. Will continue to work on [employer]. Do think that the amount of arthritic changes could be contributing especially with patient compensating for the fracture around the prosthetic hip. Had to change her gait that I think is contributing. We discussed that this seems to be multifactorial with a synovitis, arthritis, as well as a potentially ruptured Baker's cyst. Patient wants to hold on any type of injection at the moment. Did give a hinged brace for stability. Follow-up with me again in 6-8 weeks.   Updated 02/26/2024 Isabel [name] is a 68 y.o. female coming in with complaint of L knee pain. Patient states that her pain is over lateral hamstring. Took Zumba this morning and is sore now. Does feel like she is making progress despite pain from class today. Has been using patellar strap. Does not like knee brace that she was given.        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   Breast cancer (HCC)    sees onc and gen surg    Mitral regurgitation    mild on echo from 2009, no MVP per notes   Osteoarthritis    w/ bilat hip resurfacing and possible metal reaction, sees ortho at [facility]   Scoliosis    Uterine fibroid    Varicose veins    goes to [facility], s/p laser and inj tx   Past Surgical History:  Procedure Laterality Date   BREAST LUMPECTOMY  09/12   left breast   GANGLION CYST EXCISION     left hip resurfaced 2011     right hip resurfaced 2008     TOTAL HIP ARTHROPLASTY Left 12/08/2021   Procedure: REVISION OF HIP RESURFACEING TO  TOTAL HIP ARTHROPLASTY ANTERIOR APPROACH;  Surgeon: Samson Frederic, MD;  Location: [facility];  Service: Orthopedics;  Laterality: Left;   unterine fibroid  1998   removal   Social History   Socioeconomic History   Marital status: Married    Spouse name: Not on file   Number of children: Not on file   Years of education: Not on file   Highest education level: Bachelor's degree (e.g., BA, AB, BS)  Occupational History   Occupation: Art Director/Artist  Tobacco Use   Smoking status: Never   Smokeless tobacco: Never  Substance and Sexual Activity   Alcohol use: Yes    Alcohol/week: 7.0 standard drinks of alcohol    Types: 7 Glasses of wine per week   Drug use: No   Sexual activity: Yes    Birth control/protection: Post-menopausal  Other Topics Concern   Not on file  Social History Narrative   Updated 08/2015:   Art Director/Artisit - currently unemployed   Married to [employer]   No children   Exercise 2 days per week/ diet healthy   Christian   Social Drivers of Health   Financial Resource Strain: Low Risk  (03/21/2022)   Overall Financial Resource Strain (CARDIA)    Difficulty of Paying Living Expenses: Not very hard  Food Insecurity: No Food Insecurity (03/21/2022)  Hunger Vital Sign    Worried About Running Out of Food in the Last Year: Never true    Ran Out of Food in the Last Year: Never true  Transportation Needs: No Transportation Needs (03/21/2022)   PRAPARE - Administrator, Civil Service (Medical): No    Lack of Transportation (Non-Medical): No  Physical Activity: Unknown (03/21/2022)   Exercise Vital Sign    Days of Exercise per Week: 0 days    Minutes of Exercise per Session: Not on file  Stress: Stress Concern Present (03/21/2022)   [employer] of Occupational Health - Occupational Stress Questionnaire    Feeling of Stress : To some extent  Social Connections: Socially Isolated (03/21/2022)   Social Connection and Isolation Panel [NHANES]     Frequency of Communication with Friends and Family: Once a week    Frequency of Social Gatherings with Friends and Family: Once a week    Attends Religious Services: Never    Database administrator or Organizations: No    Attends Engineer, structural: Not on file    Marital Status: Married   Allergies  Allergen Reactions   Erythromycin Diarrhea and Nausea Only   Latex     Sores in mouth   Family History  Problem Relation Age of Onset   Stroke Mother 34       hemorrhagic   Other Mother        pagets disease breast   Arthritis Mother        foot, knee problems   Hypertension Father    Hyperlipidemia Father    CAD Father 76       Triple bypass; died age 66   Arthritis Father    Dementia Father    Breast cancer Maternal Aunt    Esophageal cancer Maternal Grandfather 29   Dementia Paternal Grandmother    Heart disease Paternal Grandfather 91       multiple heart attacks   Arthritis Paternal Uncle    Colon cancer Neg Hx    Stomach cancer Neg Hx    Thyroid disease Neg Hx          Current Outpatient Medications (Other):    Ascorbic Acid (VITA-C PO), Take 1,000 mg by mouth in the morning and at bedtime.   B Complex Vitamins (VITAMIN B COMPLEX PO), Take 1 tablet by mouth daily.   BIOTIN 5000 PO, Take 5,000 Units by mouth 2 (two) times daily.   Calcium Citrate 333 MG TABS, Take 333 mg by mouth in the morning and at bedtime.   Cholecalciferol (VITAMIN D3 PO), Take 2,000 Units by mouth daily.   doxycycline (VIBRA-TABS) 100 MG tablet, Take 1 tablet (100 mg total) by mouth 2 (two) times daily.   Multiple Vitamin (MULTI-VITAMIN DAILY PO), Take 1 tablet by mouth daily.   OVER THE COUNTER MEDICATION, Take 1 tablet by mouth 2 (two) times daily. Digestive enzyme   prednisoLONE acetate (PRED FORTE) 1 % ophthalmic suspension, Instill 1 drop into left eye 6 times a day --follow drop schedule provided in clinic.   Probiotic Product (PROBIOTIC-10 PO), Take 1 tablet by mouth  daily.   Reviewed prior external information including notes and imaging from  primary care provider As well as notes that were available from care everywhere and other healthcare systems.  Past medical history, social, surgical and family history all reviewed in electronic medical record.  No pertanent information unless stated regarding to the chief complaint.   Review  of Systems:  No headache, visual changes, nausea, vomiting, diarrhea, constipation, dizziness, abdominal pain, skin rash, fevers, chills, night sweats, weight loss, swollen lymph nodes, body aches, joint swelling, chest pain, shortness of breath, mood changes. POSITIVE muscle aches  Objective  Blood pressure 118/82, pulse 90, height 5\' 6"  (1.676 m), last menstrual period 12/05/2012, SpO2 96%.   General: No apparent distress alert and oriented x3 mood and affect normal, dressed appropriately.  HEENT: Pupils equal, extraocular movements intact  Respiratory: Patient's speak in full sentences and does not appear short of breath  Cardiovascular: No lower extremity edema, non tender, no erythema  Left leg exam shows a patient does have some crepitus noted of the knee.  Trace effusion noted.  Lacks last 5 degrees of flexion.  Fullness more of the popliteal region but more lateral than medial.  Limited muscular skeletal ultrasound was performed and interpreted by Antoine Primas, M  Limited ultrasound shows that patient does have some hypoechoic changes noted patient does have significant narrowing of the medial compartment.  Patient does have some narrowing and near bone-on-bone chondromalacia noted of the patellofemoral area.  Large varicose veins noted in the popliteal area.  Completely compressible though.  Very small Baker's cyst noted on the medial aspect. Impression: Knee arthritis with varicose veins    Impression and Recommendations:     Degenerative arthritis of left knee Significant arthritic changes in the medial  compartment and the patellofemoral area.  Discussed with patient about icing regimen of home exercises, which activities to do and which ones to avoid.  Increase activity slowly.  Discussed icing regimen and home exercises.  I do think patient could be a good candidate for injections including viscosupplementation.  It does appear the patient does have an enlarged inflamed varicose vein also in the popliteal area that could be giving her some compression and will refer to vascular for further evaluation.  Has had intervention previously she states.  Follow-up with me again in 2 months.  Varicosities of leg Increase of the left lower extremity.  Concerned that this could be contributing to some of the dull aching sensation of the lower extremity.  Did not feel that there is any type of blood clot at the moment.  Do think that this could cause some of the edema to be worse though.  Will refer to vascular for further evaluation. Doxycycline also given in case there is some phlebitis could be potentially contributing.  100 mg twice a day.  10-day dose  The above documentation has been reviewed and is accurate and complete Judi Saa, DO

## 2024-02-27 ENCOUNTER — Encounter: Payer: Self-pay | Admitting: Family Medicine

## 2024-02-27 DIAGNOSIS — M1712 Unilateral primary osteoarthritis, left knee: Secondary | ICD-10-CM | POA: Insufficient documentation

## 2024-02-27 NOTE — Assessment & Plan Note (Signed)
 Significant arthritic changes in the medial compartment and the patellofemoral area.  Discussed with patient about icing regimen of home exercises, which activities to do and which ones to avoid.  Increase activity slowly.  Discussed icing regimen and home exercises.  I do think patient could be a good candidate for injections including viscosupplementation.  It does appear the patient does have an enlarged inflamed varicose vein also in the popliteal area that could be giving her some compression and will refer to vascular for further evaluation.  Has had intervention previously she states.  Follow-up with me again in 2 months.

## 2024-02-27 NOTE — Assessment & Plan Note (Signed)
 Increase of the left lower extremity.  Concerned that this could be contributing to some of the dull aching sensation of the lower extremity.  Did not feel that there is any type of blood clot at the moment.  Do think that this could cause some of the edema to be worse though.  Will refer to vascular for further evaluation.

## 2024-03-01 ENCOUNTER — Telehealth: Payer: Self-pay

## 2024-03-01 NOTE — Telephone Encounter (Signed)
**  patient scheduled for 04/22/24**  MONOVISC authorized for left knee Deductible does not apply Once OOP is met patient is covered at 100% OOP max $3400 has met $125.56 Only one copay per DOS $20 copay PA authorized Auth # [id number] 02/28/24-05/28/24

## 2024-03-04 NOTE — Telephone Encounter (Signed)
 Noted.

## 2024-03-11 ENCOUNTER — Ambulatory Visit: Payer: PPO

## 2024-03-18 ENCOUNTER — Other Ambulatory Visit (HOSPITAL_COMMUNITY): Payer: Self-pay

## 2024-03-18 DIAGNOSIS — Z124 Encounter for screening for malignant neoplasm of cervix: Secondary | ICD-10-CM | POA: Diagnosis not present

## 2024-03-18 DIAGNOSIS — Z1331 Encounter for screening for depression: Secondary | ICD-10-CM | POA: Diagnosis not present

## 2024-03-18 DIAGNOSIS — Z01411 Encounter for gynecological examination (general) (routine) with abnormal findings: Secondary | ICD-10-CM | POA: Diagnosis not present

## 2024-03-18 DIAGNOSIS — Z01419 Encounter for gynecological examination (general) (routine) without abnormal findings: Secondary | ICD-10-CM | POA: Diagnosis not present

## 2024-03-18 MED ORDER — ESTRADIOL 10 MCG VA TABS
ORAL_TABLET | VAGINAL | 11 refills | Status: AC
  Filled 2024-03-18: qty 18, 28d supply, fill #0
  Filled 2024-04-23: qty 16, 21d supply, fill #1
  Filled 2024-07-09: qty 16, 21d supply, fill #2
  Filled 2024-09-11: qty 16, 21d supply, fill #3
  Filled 2024-11-02: qty 16, 21d supply, fill #4
  Filled 2025-01-09: qty 16, 21d supply, fill #5

## 2024-03-19 ENCOUNTER — Other Ambulatory Visit (HOSPITAL_COMMUNITY): Payer: Self-pay

## 2024-03-20 ENCOUNTER — Telehealth: Payer: Self-pay | Admitting: Podiatry

## 2024-03-20 NOTE — Telephone Encounter (Signed)
 LVM to resched appt to 5/14 due to provider OOF.

## 2024-03-25 NOTE — Telephone Encounter (Signed)
 2nd VM left to reschedule 5/12 to 5/14 due to provider oof. [facility] message sent as well.

## 2024-04-05 ENCOUNTER — Other Ambulatory Visit: Payer: Self-pay

## 2024-04-05 DIAGNOSIS — I872 Venous insufficiency (chronic) (peripheral): Secondary | ICD-10-CM

## 2024-04-09 ENCOUNTER — Ambulatory Visit (HOSPITAL_COMMUNITY)
Admission: RE | Admit: 2024-04-09 | Discharge: 2024-04-09 | Disposition: A | Discharge status: Home / Self Care | Source: Ambulatory Visit | Attending: Vascular Surgery | Admitting: Vascular Surgery

## 2024-04-09 DIAGNOSIS — I872 Venous insufficiency (chronic) (peripheral): Secondary | ICD-10-CM | POA: Diagnosis not present

## 2024-04-11 ENCOUNTER — Telehealth: Payer: Self-pay | Admitting: Family Medicine

## 2024-04-11 NOTE — Telephone Encounter (Signed)
 I have not heard of [employer] not releasing that information to the patient. I know that they will not release to 3rd parties. I've attempted to contact [employer] for OOP estimate before and they would not give me that information.   If [employer] will not tell you the contracted cost for the medication, Heidi Llamas should be able to look it up.

## 2024-04-11 NOTE — Telephone Encounter (Signed)
 Patient called wanting to know how much she would owe after a Monovisc injection. She contacted her insurance company [facility]) and was told it would be 20% of the allowed amount.  I advised that we would bill $2,975 but are not sure what the allowed amount would be.  She was frustrated that no one could tell her what the cost would be. She said that per her insurance company, we would need to call [employer] so that they can tell us  the allowed amount and what her total should be.  Do you have any information on allowed amounts?

## 2024-04-12 ENCOUNTER — Ambulatory Visit: Attending: Vascular Surgery | Admitting: Physician Assistant

## 2024-04-12 VITALS — BP 118/78 | HR 68 | Temp 98.1°F | Wt 144.3 lb

## 2024-04-12 DIAGNOSIS — I872 Venous insufficiency (chronic) (peripheral): Secondary | ICD-10-CM

## 2024-04-12 NOTE — Telephone Encounter (Signed)
 Per Isabel [name]: Contracted rate is $2,135.35 20% would be $564.65  Patient aware.

## 2024-04-15 ENCOUNTER — Other Ambulatory Visit

## 2024-04-16 NOTE — Progress Notes (Signed)
 Requested by:  Isidro Margo, DO [address] [place],  [place] [address]  Reason for consultation: venous insufficiency    History of Present Illness   Isabel [name] is a 68 y.o. (03/27/1956) female who presents for evaluation of venous insufficiency.  She says over the past year she has been having swelling, fullness, and discomfort behind her left knee after prolonged periods of standing or after exercise.  She says that she has tried to wear knee-high compression stockings and this did not help her symptoms.  She also tries to elevate her legs in the evenings after work and this only mildly helps her symptoms.  She denies any significant lower extremity swelling.  She has a prior history of left greater saphenous vein ablation.  She has no prior history of DVT.  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   Breast cancer (HCC)    sees onc and gen surg    Mitral regurgitation    mild on echo from 2009, no MVP per notes   Osteoarthritis    w/ bilat hip resurfacing and possible metal reaction, sees ortho at [facility]   Scoliosis    Uterine fibroid    Varicose veins    goes to [facility], s/p laser and inj tx    Past Surgical History:  Procedure Laterality Date   BREAST LUMPECTOMY  09/12   left breast   GANGLION CYST EXCISION     left hip resurfaced 2011     right hip resurfaced 2008     TOTAL HIP ARTHROPLASTY Left 12/08/2021   Procedure: REVISION OF HIP RESURFACEING TO TOTAL HIP ARTHROPLASTY ANTERIOR APPROACH;  Surgeon: Adonica Hoose, MD;  Location: [facility];  Service: Orthopedics;  Laterality: Left;   unterine fibroid  1998   removal    Social History   Socioeconomic History   Marital status: Married    Spouse name: Not on file   Number of children: Not on file   Years of education: Not on file   Highest education  level: Bachelor's degree (e.g., BA, AB, BS)  Occupational History   Occupation: Art Director/Artist  Tobacco Use   Smoking status: Never   Smokeless tobacco: Never  Substance and Sexual Activity   Alcohol  use: Yes    Alcohol /week: 7.0 standard drinks of alcohol     Types: 7 Glasses of wine per week   Drug use: No   Sexual activity: Yes    Birth control/protection: Post-menopausal  Other Topics Concern   Not on file  Social History Narrative   Updated 08/2015:   Art Director/Artisit - currently unemployed   Married to [employer]   No children   Exercise 2 days per week/ diet healthy   Christian   Social Drivers of Health   Financial Resource Strain: Low Risk  (03/21/2022)   Overall Financial Resource Strain (CARDIA)    Difficulty of Paying Living Expenses: Not very hard  Food Insecurity: No Food Insecurity (03/21/2022)   Hunger Vital Sign    Worried About Running Out of Food in the Last Year: Never true    Ran Out of Food in the Last Year: Never true  Transportation Needs: No Transportation Needs (03/21/2022)   PRAPARE - Administrator, Civil Service (Medical): No    Lack of Transportation (Non-Medical): No  Physical Activity: Unknown (03/21/2022)   Exercise Vital Sign    Days of Exercise per Week: 0 days    Minutes of Exercise per Session: Not on file  Stress: Stress Concern Present (03/21/2022)   [employer] of Occupational Health - Occupational Stress Questionnaire    Feeling of Stress : To some extent  Social Connections: Socially Isolated (03/21/2022)   Social Connection and Isolation Panel [NHANES]    Frequency of Communication with Friends and Family: Once a week    Frequency of Social Gatherings with Friends and Family: Once a week    Attends Religious Services: Never    Database administrator or Organizations: No    Attends Engineer, structural: Not on file    Marital Status: Married  Catering manager Violence: Not on file    Family History   Problem Relation Age of Onset   Stroke Mother 53       hemorrhagic   Other Mother        pagets disease breast   Arthritis Mother        foot, knee problems   Hypertension Father    Hyperlipidemia Father    CAD Father 61       Triple bypass; died age 4   Arthritis Father    Dementia Father    Breast cancer Maternal Aunt    Esophageal cancer Maternal Grandfather 69   Dementia Paternal Grandmother    Heart disease Paternal Grandfather 39       multiple heart attacks   Arthritis Paternal Uncle    Colon cancer Neg Hx    Stomach cancer Neg Hx    Thyroid  disease Neg Hx     Current Outpatient Medications  Medication Sig Dispense Refill   Ascorbic Acid (VITA-C PO) Take 1,000 mg by mouth in the morning and at bedtime.     B Complex Vitamins (VITAMIN B COMPLEX PO) Take 1 tablet by mouth daily.     BIOTIN 5000 PO Take 5,000 Units by mouth 2 (two) times daily.     Calcium Citrate 333 MG TABS Take 333 mg by mouth in the morning and at bedtime.     Cholecalciferol (VITAMIN D3 PO) Take 2,000 Units by mouth daily.     doxycycline  (VIBRA -TABS) 100 MG tablet Take 1 tablet (100 mg total) by mouth 2 (two) times daily. 20 tablet 0   Estradiol  (YUVAFEM ) 10 MCG TABS vaginal tablet Insert one tablet every day by vaginal route for 14 days then one tab twice weekly. 18 tablet 11   Multiple Vitamin (MULTI-VITAMIN DAILY PO) Take 1 tablet by mouth daily.     OVER THE COUNTER MEDICATION Take 1 tablet by mouth 2 (two) times daily. Digestive enzyme     prednisoLONE  acetate (PRED FORTE ) 1 % ophthalmic suspension Instill 1 drop into left eye 6 times a day --follow drop schedule provided in clinic. 10 mL 1   Probiotic Product (PROBIOTIC-10 PO) Take 1 tablet by mouth daily.     No current facility-administered medications for this visit.    Allergies  Allergen Reactions   Erythromycin Diarrhea and Nausea Only   Latex     Sores in mouth    REVIEW OF SYSTEMS (negative unless checked):   Cardiac:  []   Chest pain or chest pressure? []  Shortness of breath upon activity? []  Shortness of breath when lying flat? []  Irregular heart rhythm?  Vascular:  []  Pain in calf, thigh, or hip brought on by walking? []  Pain  in feet at night that wakes you up from your sleep? []  Blood clot in your veins? []  Leg swelling?  Pulmonary:  []  Oxygen at home? []  Productive cough? []  Wheezing?  Neurologic:  []  Sudden weakness in arms or legs? []  Sudden numbness in arms or legs? []  Sudden onset of difficult speaking or slurred speech? []  Temporary loss of vision in one eye? []  Problems with dizziness?  Gastrointestinal:  []  Blood in stool? []  Vomited blood?  Genitourinary:  []  Burning when urinating? []  Blood in urine?  Psychiatric:  []  Major depression  Hematologic:  []  Bleeding problems? []  Problems with blood clotting?  Dermatologic:  []  Rashes or ulcers?  Constitutional:  []  Fever or chills?  Ear/Nose/Throat:  []  Change in hearing? []  Nose bleeds? []  Sore throat?  Musculoskeletal:  []  Back pain? []  Joint pain? []  Muscle pain?   Physical Examination     Vitals:   04/12/24 0842  BP: 118/78  Pulse: 68  Temp: 98.1 F (36.7 C)  TempSrc: Temporal  SpO2: 97%  Weight: 144 lb 4.8 oz (65.5 kg)   Body mass index is 23.29 kg/m.  General:  WDWN in NAD; vital signs documented above Gait: Not observed HENT: WNL, normocephalic Pulmonary: normal non-labored breathing  Cardiac: regular Abdomen: soft, NT, no masses Skin: without rashes Vascular Exam/Pulses: Extremities: LLE without varicose veins, with reticular veins, without edema, without stasis pigmentation, without lipodermatosclerosis, without ulcers. Mild fullness behind the knee Musculoskeletal: no muscle wasting or atrophy  Neurologic: A&O X 3;  No focal weakness or paresthesias are detected Psychiatric:  The pt has Normal affect.  Non-invasive Vascular Imaging   LLE Venous Insufficiency Duplex (04/09/2024):   +----------------------+---------+------+-----------+------------+---------  ----+  LEFT                 Reflux NoRefluxReflux TimeDiameter cmsComments                                        Yes                                         +----------------------+---------+------+-----------+------------+---------  ----+  CFV                            yes   >1 second                             +----------------------+---------+------+-----------+------------+---------  ----+  FV prox                               >1 second                             +----------------------+---------+------+-----------+------------+---------  ----+  FV dist                         yes   >1 second                             +----------------------+---------+------+-----------+------------+---------  ----+  Popliteal  yes   >1 second                             +----------------------+---------+------+-----------+------------+---------  ----+  GSV at SFJ                                          0.41                    +----------------------+---------+------+-----------+------------+---------  ----+  GSV prox thigh                                              prior                                                                        ablation/stri                                                             pping           +----------------------+---------+------+-----------+------------+---------  ----+  GSV mid thigh                                               prior                                                                        ablation/stri                                                             pping           +----------------------+---------+------+-----------+------------+---------  ----+  GSV dist thigh                                              prior  ablation/stri                                                             pping           +----------------------+---------+------+-----------+------------+---------  ----+  GSV at knee                                                 prior                                                                        ablation/stri                                                             pping           +----------------------+---------+------+-----------+------------+---------  ----+  GSV prox calf                                               prior                                                                        ablation/stri                                                             pping           +----------------------+---------+------+-----------+------------+---------  ----+  SSV Pop Fossa                   yes    >500 ms      1.0     tortuous  and                                                               dilated         +----------------------+---------+------+-----------+------------+---------  ----+  SSV prox calf  yes    >500 ms      0.28                    +----------------------+---------+------+-----------+------------+---------  ----+  SSV mid calf                    yes    >500 ms      0.29                    +----------------------+---------+------+-----------+------------+---------  ----+  AASV Prox                       yes    >500 ms      0.37                    +----------------------+---------+------+-----------+------------+---------  ----+  AASV Mid                        yes    >500 ms      0.28                    +----------------------+---------+------+-----------+------------+---------  ----+  Posterior/Lateral Calf          yes    >500 ms      0.26                     VV                                                                          +----------------------+---------+------+-----------+------------+---------  ----+    Medical Decision Making   Isabel [name] is a 68 y.o. female who presents for evaluation of venous insufficiency  Based on the patient's duplex, there is reflux in the left common femoral vein, femoral vein, and popliteal vein.  There is also reflux in the small saphenous vein and anterior accessory saphenous vein.  She has had prior stripping of her left greater saphenous vein.  She would not be a candidate for ablation/stripping of her small saphenous or anterior accessory saphenous due to its small caliber. She says over the past year she has started experiencing fullness and extreme discomfort behind her left knee after prolonged periods of standing and exercise.  She denies any significant lower extremity swelling.  Her symptoms have not improved with compression stockings.  She only experiences mild improvement in her symptoms after leg elevation. On exam she has no significant lower extremity edema.  She has no visible varicose veins.  She does have some fullness behind her left knee, potentially due to a dilated varicose vein underneath the skin.  Per duplex, this could be her small saphenous vein.  I have explained to the patient that her popliteal fullness could be due to her underlying venous insufficiency and dilated small saphenous vein.  I have explained to her that it is common for varicose veins to become enlarge throughout the day due to increased venous pressure, thus causing increasing discomfort and pain. I have also explained that her only current treatment options are for conservative therapy.  Given that knee high compression stockings do not  reach her area of concern, I have suggested using an ace bandage for compression therapy. I applied an ace bandage to her left leg from the mid  shin to the mid thigh. I encouraged her to wear this daily and continue leg elevation as needed to help with her symptoms She can follow up with our office as needed   Ron Cobbs, PA-C [facility] Office: [phone]  04/16/2024, 10:25 PM  Clinic MD: Susi Eric

## 2024-04-19 NOTE — Progress Notes (Signed)
 [facility] [address] [place] [address] Phone: [phone] Subjective:   Isabel [name], am serving as a scribe for Dr. Ronnell Coins.  I'm seeing this patient by the request  of:  Aida House, MD  CC:   [id number]  02/26/2024 Increase of the left lower extremity.  Concerned that this could be contributing to some of the dull aching sensation of the lower extremity.  Did not feel that there is any type of blood clot at the moment.  Do think that this could cause some of the edema to be worse though.  Will refer to vascular for further evaluation.     Significant arthritic changes in the medial compartment and the patellofemoral area.  Discussed with patient about icing regimen of home exercises, which activities to do and which ones to avoid.  Increase activity slowly.  Discussed icing regimen and home exercises.  I do think patient could be a good candidate for injections including viscosupplementation.  It does appear the patient does have an enlarged inflamed varicose vein also in the popliteal area that could be giving her some compression and will refer to vascular for further evaluation.  Has had intervention previously she states.  Follow-up with me again in 2 months.     Updated 04/22/2024 Isabel [name] is a 68 y.o. female coming in with complaint of L knee and leg pain. Patient states that she has some medial knee pain. Pain in back of her knee is worse when standing in one place at work. Wearing brace makes her pain worse. Did not care for compression stockings.        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   Breast cancer (HCC)    sees onc and gen surg    Mitral regurgitation    mild on echo from 2009, no MVP per notes   Osteoarthritis    w/ bilat hip resurfacing and possible metal reaction, sees ortho at [facility]   Scoliosis    Uterine fibroid    Varicose veins     goes to [facility], s/p laser and inj tx   Past Surgical History:  Procedure Laterality Date   BREAST LUMPECTOMY  09/12   left breast   GANGLION CYST EXCISION     left hip resurfaced 2011     right hip resurfaced 2008     TOTAL HIP ARTHROPLASTY Left 12/08/2021   Procedure: REVISION OF HIP RESURFACEING TO TOTAL HIP ARTHROPLASTY ANTERIOR APPROACH;  Surgeon: Adonica Hoose, MD;  Location: [facility];  Service: Orthopedics;  Laterality: Left;   unterine fibroid  1998   removal   Social History   Socioeconomic History   Marital status: Married    Spouse name: Not on file   Number of children: Not on file   Years of education: Not on file   Highest education level: Bachelor's degree (e.g., BA, AB, BS)  Occupational History   Occupation: Engineer, petroleum  Tobacco Use   Smoking status: Never   Smokeless tobacco: Never  Substance and Sexual Activity   Alcohol  use: Yes    Alcohol /week: 7.0 standard drinks of alcohol     Types: 7 Glasses of wine per week   Drug use: No   Sexual activity: Yes    Birth control/protection: Post-menopausal  Other Topics Concern   Not on file  Social History Narrative   Updated 08/2015:   Art Director/Artisit -  currently unemployed   Married to [employer]   No children   Exercise 2 days per week/ diet healthy   Christian   Social Drivers of Health   Financial Resource Strain: Low Risk  (03/21/2022)   Overall Financial Resource Strain (CARDIA)    Difficulty of Paying Living Expenses: Not very hard  Food Insecurity: No Food Insecurity (03/21/2022)   Hunger Vital Sign    Worried About Running Out of Food in the Last Year: Never true    Ran Out of Food in the Last Year: Never true  Transportation Needs: No Transportation Needs (03/21/2022)   PRAPARE - Administrator, Civil Service (Medical): No    Lack of Transportation (Non-Medical): No  Physical Activity: Unknown (03/21/2022)   Exercise Vital Sign    Days of Exercise per Week: 0  days    Minutes of Exercise per Session: Not on file  Stress: Stress Concern Present (03/21/2022)   [employer] of Occupational Health - Occupational Stress Questionnaire    Feeling of Stress : To some extent  Social Connections: Socially Isolated (03/21/2022)   Social Connection and Isolation Panel [NHANES]    Frequency of Communication with Friends and Family: Once a week    Frequency of Social Gatherings with Friends and Family: Once a week    Attends Religious Services: Never    Database administrator or Organizations: No    Attends Engineer, structural: Not on file    Marital Status: Married   Allergies  Allergen Reactions   Erythromycin Diarrhea and Nausea Only   Latex     Sores in mouth   Family History  Problem Relation Age of Onset   Stroke Mother 11       hemorrhagic   Other Mother        pagets disease breast   Arthritis Mother        foot, knee problems   Hypertension Father    Hyperlipidemia Father    CAD Father 26       Triple bypass; died age 43   Arthritis Father    Dementia Father    Breast cancer Maternal Aunt    Esophageal cancer Maternal Grandfather 30   Dementia Paternal Grandmother    Heart disease Paternal Grandfather 68       multiple heart attacks   Arthritis Paternal Uncle    Colon cancer Neg Hx    Stomach cancer Neg Hx    Thyroid  disease Neg Hx          Current Outpatient Medications (Other):    Ascorbic Acid (VITA-C PO), Take 1,000 mg by mouth in the morning and at bedtime.   B Complex Vitamins (VITAMIN B COMPLEX PO), Take 1 tablet by mouth daily.   BIOTIN 5000 PO, Take 5,000 Units by mouth 2 (two) times daily.   Calcium Citrate 333 MG TABS, Take 333 mg by mouth in the morning and at bedtime.   Cholecalciferol (VITAMIN D3 PO), Take 2,000 Units by mouth daily.   Estradiol  (YUVAFEM ) 10 MCG TABS vaginal tablet, Insert one tablet every day by vaginal route for 14 days then one tab twice weekly.   Multiple Vitamin  (MULTI-VITAMIN DAILY PO), Take 1 tablet by mouth daily.   OVER THE COUNTER MEDICATION, Take 1 tablet by mouth 2 (two) times daily. Digestive enzyme   prednisoLONE  acetate (PRED FORTE ) 1 % ophthalmic suspension, Instill 1 drop into left eye 6 times a day --follow drop schedule provided in  clinic.   Probiotic Product (PROBIOTIC-10 PO), Take 1 tablet by mouth daily.   Reviewed prior external information including notes and imaging from  primary care provider As well as notes that were available from care everywhere and other healthcare systems.  Past medical history, social, surgical and family history all reviewed in electronic medical record.  No pertanent information unless stated regarding to the chief complaint.   Review of Systems:  No headache, visual changes, nausea, vomiting, diarrhea, constipation, dizziness, abdominal pain, skin rash, fevers, chills, night sweats, weight loss, swollen lymph nodes, body aches, joint swelling, chest pain, shortness of breath, mood changes. POSITIVE muscle aches  Objective  Blood pressure 110/82, pulse 92, height 5\' 6"  (1.676 m), weight 143 lb (64.9 kg), last menstrual period 12/05/2012, SpO2 98%.   General: No apparent distress alert and oriented x3 mood and affect normal, dressed appropriately.  HEENT: Pupils equal, extraocular movements intact  Respiratory: Patient's speak in full sentences and does not appear short of breath  Cardiovascular: No lower extremity edema, non tender, no erythema  Trace effusion noted of the left knee.  Crepitus noted.  Some mild instability of the knee noted.  After informed written and verbal consent, patient was seated on exam table. Left knee was prepped with alcohol  swab and utilizing anterolateral approach, patient's left knee space was injected with 48 mg per 3 mL of Monovisc (sodium hyaluronate) in a prefilled syringe was injected easily into the knee through a 22-gauge needle..Patient tolerated the procedure well  without immediate complications.    Impression and Recommendations:     The above documentation has been reviewed and is accurate and complete Tikesha Mort M Nawaal Alling, DO

## 2024-04-22 ENCOUNTER — Ambulatory Visit: Admitting: Family Medicine

## 2024-04-22 ENCOUNTER — Encounter: Payer: Self-pay | Admitting: Family Medicine

## 2024-04-22 VITALS — BP 110/82 | HR 92 | Ht 66.0 in | Wt 143.0 lb

## 2024-04-22 DIAGNOSIS — M1712 Unilateral primary osteoarthritis, left knee: Secondary | ICD-10-CM

## 2024-04-22 MED ORDER — HYALURONAN 88 MG/4ML IX SOSY
176.0000 mg | PREFILLED_SYRINGE | Freq: Once | INTRA_ARTICULAR | Status: AC
  Administered 2024-04-22: 176 mg via INTRA_ARTICULAR

## 2024-04-22 NOTE — Patient Instructions (Addendum)
 Monovisc for L knee today See me again in 2 months

## 2024-04-22 NOTE — Assessment & Plan Note (Signed)
 Chronic problem with exacerbation.  Responded hopefully well to this viscosupplementation.  Patient has had difficulty with previous surgeries and would of course like to avoid surgery if possible.  Discussed with patient to continue to stay active though.  Increase activity slowly.  Follow-up again in 6 to 8 weeks otherwise.

## 2024-04-23 ENCOUNTER — Other Ambulatory Visit (HOSPITAL_COMMUNITY): Payer: Self-pay

## 2024-04-24 ENCOUNTER — Telehealth: Payer: Self-pay | Admitting: *Deleted

## 2024-04-24 DIAGNOSIS — J636 Pneumoconiosis due to other specified inorganic dusts: Secondary | ICD-10-CM

## 2024-04-24 NOTE — Telephone Encounter (Signed)
 Copied from CRM [id number]. Topic: Clinical - Request for Lab/Test Order >> Apr 24, 2024  8:18 AM Alyse July wrote: Reason for CRM: Patient would like to know if another Chromium and Cobalt, WB (MoM) can be order for her upcoming physical appointment scheduled in July 29, 2024. Please contact patient with any additional question. Contact number confirmed. [phone]

## 2024-04-25 NOTE — Telephone Encounter (Signed)
 Order placed as future in the computer

## 2024-04-25 NOTE — Telephone Encounter (Signed)
 Spoke with the patient and informed her the orders were entered as requested.  Offered to schedule a lab appt and the patient stated she will await the visit with PCP in August.

## 2024-05-13 ENCOUNTER — Encounter

## 2024-05-13 ENCOUNTER — Encounter (HOSPITAL_COMMUNITY)

## 2024-05-17 ENCOUNTER — Ambulatory Visit (INDEPENDENT_AMBULATORY_CARE_PROVIDER_SITE_OTHER): Admitting: *Deleted

## 2024-05-17 DIAGNOSIS — L603 Nail dystrophy: Secondary | ICD-10-CM

## 2024-05-17 NOTE — Progress Notes (Signed)
 Patient presents today for the 2nd laser treatment. Diagnosed with mycotic nail infection by Dr. Clydia Dart.  Dr. Clydia Dart did her 1st laser.   Toenail most affected 2nd, 3rd, 4th, 5th bilateral.  All other systems are negative.  Nails were filed thin. Laser therapy was administered to 2-5 toenails bilateral and patient tolerated the treatment well. All safety precautions were in place.   Patient is also using a topical compound.   Follow up in 4 weeks for laser # 3.

## 2024-06-20 ENCOUNTER — Other Ambulatory Visit (HOSPITAL_COMMUNITY): Payer: Self-pay

## 2024-06-20 MED ORDER — AMOXICILLIN 500 MG PO CAPS
2000.0000 mg | ORAL_CAPSULE | ORAL | 0 refills | Status: AC
  Filled 2024-06-20: qty 20, 5d supply, fill #0

## 2024-06-21 ENCOUNTER — Other Ambulatory Visit (HOSPITAL_COMMUNITY): Payer: Self-pay

## 2024-06-28 ENCOUNTER — Ambulatory Visit

## 2024-06-28 NOTE — Progress Notes (Signed)
 [facility] [address] [place] [address] Phone: [phone] Subjective:   Isabel [name] am a scribe for Dr. Claudene.   I'm seeing this patient by the request  of:  Ozell Heron HERO, MD  CC: Left knee pain  YEP:Dlagzrupcz  04/22/2024 Chronic problem with exacerbation.  Responded hopefully well to this viscosupplementation.  Patient has had difficulty with previous surgeries and would of course like to avoid surgery if possible.  Discussed with patient to continue to stay active though.  Increase activity slowly.  Follow-up again in 6 to 8 weeks otherwise.     Updated 07/01/2024 Isabel [name] is a 68 y.o. female coming in with complaint of L knee pain. Patient states the left knee pain is better.  States that only one place seems to give some difficulty but nothing severe.      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   Breast cancer (HCC)    sees onc and gen surg    Mitral regurgitation    mild on echo from 2009, no MVP per notes   Osteoarthritis    w/ bilat hip resurfacing and possible metal reaction, sees ortho at [facility]   Scoliosis    Uterine fibroid    Varicose veins    go[facility] to [facility], s/p laser and inj tx   Past Surgical History:  Procedure Laterality Date   BREAST LUMPECTOMY  09/12   left breast   GANGLION CYST EXCISION     left hip resurfaced 2011     right hip resurfaced 2008     TOTAL HIP ARTHROPLASTY Left 12/08/2021   Procedure: REVISION OF HIP RESURFACEING TO TOTAL HIP ARTHROPLASTY ANTERIOR APPROACH;  Surgeon: Fidel Rogue, MD;  Location: [facility];  Service: Orthopedics;  Laterality: Left;   unterine fibroid  1998   removal   Social History   Socioeconomic History   Marital status: Married    Spouse name: Not on file   Number of children: Not on file   Years of education: Not on file   Highest education level: Bachelor's degree (e.g., BA, AB,  BS)  Occupational History   Occupation: Art Director/Artist  Tobacco Use   Smoking status: Never   Smokeless tobacco: Never  Substance and Sexual Activity   Alcohol  use: Yes    Alcohol /week: 7.0 standard drinks of alcohol     Types: 7 Glasses of wine per week   Drug use: No   Sexual activity: Yes    Birth control/protection: Post-menopausal  Other Topics Concern   Not on file  Social History Narrative   Updated 08/2015:   Art Director/Artisit - currently unemployed   Married to [employer]   No children   Exercise 2 days per week/ diet healthy   Christian   Social Drivers of Health   Financial Resource Strain: Low Risk  (03/21/2022)   Overall Financial Resource Strain (CARDIA)    Difficulty of Paying Living Expenses: Not very hard  Food Insecurity: No Food Insecurity (03/21/2022)   Hunger Vital Sign    Worried About Running Out of Food in the Last Year: Never true    Ran Out of Food in the Last Year: Never true  Transportation Needs: No Transportation Needs (03/21/2022)   PRAPARE - Administrator, Civil Service (Medical): No    Lack of Transportation (Non-Medical): No  Physical Activity: Unknown (03/21/2022)   Exercise Vital Sign  Days of Exercise per Week: 0 days    Minutes of Exercise per Session: Not on file  Stress: Stress Concern Present (03/21/2022)   [employer] of Occupational Health - Occupational Stress Questionnaire    Feeling of Stress : To some extent  Social Connections: Socially Isolated (03/21/2022)   Social Connection and Isolation Panel    Frequency of Communication with Friends and Family: Once a week    Frequency of Social Gatherings with Friends and Family: Once a week    Attends Religious Services: Never    Database administrator or Organizations: No    Attends Engineer, structural: Not on file    Marital Status: Married   Allergies  Allergen Reactions   Erythromycin Diarrhea and Nausea Only   Latex     Sores in mouth    Family History  Problem Relation Age of Onset   Stroke Mother 66       hemorrhagic   Other Mother        pagets disease breast   Arthritis Mother        foot, knee problems   Hypertension Father    Hyperlipidemia Father    CAD Father 17       Triple bypass; died age 89   Arthritis Father    Dementia Father    Breast cancer Maternal Aunt    Esophageal cancer Maternal Grandfather 20   Dementia Paternal Grandmother    Heart disease Paternal Grandfather 58       multiple heart attacks   Arthritis Paternal Uncle    Colon cancer Neg Hx    Stomach cancer Neg Hx    Thyroid  disease Neg Hx          Current Outpatient Medications (Other):    amoxicillin  (AMOXIL ) 500 MG capsule, Take 4 capsules (2,000 mg total) by mouth 1 hour prior to dental visit.   Ascorbic Acid (VITA-C PO), Take 1,000 mg by mouth in the morning and at bedtime.   B Complex Vitamins (VITAMIN B COMPLEX PO), Take 1 tablet by mouth daily.   BIOTIN 5000 PO, Take 5,000 Units by mouth 2 (two) times daily.   Calcium Citrate 333 MG TABS, Take 333 mg by mouth in the morning and at bedtime.   Cholecalciferol (VITAMIN D3 PO), Take 2,000 Units by mouth daily.   Estradiol  (YUVAFEM ) 10 MCG TABS vaginal tablet, Insert 1 tablet by vaginal route once daily for 14 days then insert 1 tablet twice weekly.   Multiple Vitamin (MULTI-VITAMIN DAILY PO), Take 1 tablet by mouth daily.   OVER THE COUNTER MEDICATION, Take 1 tablet by mouth 2 (two) times daily. Digestive enzyme   prednisoLONE  acetate (PRED FORTE ) 1 % ophthalmic suspension, Instill 1 drop into left eye 6 times a day --follow drop schedule provided in clinic.   Probiotic Product (PROBIOTIC-10 PO), Take 1 tablet by mouth daily.   Reviewed prior external information including notes and imaging from  primary care provider As well as notes that were available from care everywhere and other healthcare systems.  Objective  Blood pressure 120/70, pulse 94, height 5' 6 (1.676  m), weight 142 lb (64.4 kg), last menstrual period 12/05/2012, SpO2 97%.   General: No apparent distress alert and oriented x3 mood and affect normal, dressed appropriately.  HEENT: Pupils equal, extraocular movements intact  Respiratory: Patient's speak in full sentences and does not appear short of breath  Cardiovascular: No lower extremity edema, non tender, no erythema  Patient's left  knee has some bony abnormality compared to the contralateral side.  Still some mild crepitus noted.    Impression and Recommendations:     The above documentation has been reviewed and is accurate and complete Giamarie Bueche M Joren Rehm, DO

## 2024-07-01 ENCOUNTER — Other Ambulatory Visit: Payer: Self-pay

## 2024-07-01 ENCOUNTER — Ambulatory Visit: Admitting: Family Medicine

## 2024-07-01 ENCOUNTER — Encounter: Payer: Self-pay | Admitting: Family Medicine

## 2024-07-01 VITALS — BP 120/70 | HR 94 | Ht 66.0 in | Wt 142.0 lb

## 2024-07-01 DIAGNOSIS — M25562 Pain in left knee: Secondary | ICD-10-CM

## 2024-07-01 DIAGNOSIS — M1712 Unilateral primary osteoarthritis, left knee: Secondary | ICD-10-CM

## 2024-07-01 NOTE — Assessment & Plan Note (Signed)
 Discussed with patient that I do think she has done very well with the viscosupplementation.  Can consider the possibility of repeating injection every 6 months if needed.  Does have some cortical irregularities noted and could consider an MRI but patient states that the other would not change her management at this point.  Follow-up again in 6 to 8 weeks otherwise.

## 2024-07-05 ENCOUNTER — Other Ambulatory Visit

## 2024-07-09 ENCOUNTER — Other Ambulatory Visit (HOSPITAL_COMMUNITY): Payer: Self-pay

## 2024-07-09 ENCOUNTER — Other Ambulatory Visit: Payer: Self-pay

## 2024-07-19 ENCOUNTER — Ambulatory Visit

## 2024-07-26 ENCOUNTER — Ambulatory Visit (INDEPENDENT_AMBULATORY_CARE_PROVIDER_SITE_OTHER): Payer: Self-pay

## 2024-07-26 DIAGNOSIS — L603 Nail dystrophy: Secondary | ICD-10-CM

## 2024-07-26 NOTE — Progress Notes (Signed)
 Patient presents today for the 3rd laser treatment. Diagnosed with mycotic nail infection by Dr. Gershon.   Dr. Gershon did her 1st laser.    Toenail most affected 2nd, 3rd, 4th, 5th bilateral.   All other systems are negative.   Nails were filed thin. Laser therapy was administered to 2-5 toenails bilateral and patient tolerated the treatment well. All safety precautions were in place.    Patient is also using a topical compound.     Follow up in 4 weeks for laser # 4.

## 2024-07-29 ENCOUNTER — Encounter: Admitting: Family Medicine

## 2024-08-09 ENCOUNTER — Other Ambulatory Visit

## 2024-08-16 ENCOUNTER — Other Ambulatory Visit (HOSPITAL_COMMUNITY): Payer: Self-pay

## 2024-08-26 ENCOUNTER — Ambulatory Visit (INDEPENDENT_AMBULATORY_CARE_PROVIDER_SITE_OTHER): Admitting: Family Medicine

## 2024-08-26 ENCOUNTER — Encounter: Payer: Self-pay | Admitting: Family Medicine

## 2024-08-26 VITALS — BP 110/80 | HR 60 | Temp 98.5°F | Ht 66.0 in | Wt 138.9 lb

## 2024-08-26 DIAGNOSIS — Z13220 Encounter for screening for lipoid disorders: Secondary | ICD-10-CM | POA: Diagnosis not present

## 2024-08-26 DIAGNOSIS — Z131 Encounter for screening for diabetes mellitus: Secondary | ICD-10-CM

## 2024-08-26 DIAGNOSIS — Z0000 Encounter for general adult medical examination without abnormal findings: Secondary | ICD-10-CM

## 2024-08-26 DIAGNOSIS — J636 Pneumoconiosis due to other specified inorganic dusts: Secondary | ICD-10-CM

## 2024-08-26 DIAGNOSIS — Z1231 Encounter for screening mammogram for malignant neoplasm of breast: Secondary | ICD-10-CM

## 2024-08-26 LAB — COMPREHENSIVE METABOLIC PANEL WITH GFR
ALT: 21 U/L (ref 0–35)
AST: 21 U/L (ref 0–37)
Albumin: 4.5 g/dL (ref 3.5–5.2)
Alkaline Phosphatase: 59 U/L (ref 39–117)
BUN: 10 mg/dL (ref 6–23)
CO2: 32 meq/L (ref 19–32)
Calcium: 9.7 mg/dL (ref 8.4–10.5)
Chloride: 103 meq/L (ref 96–112)
Creatinine, Ser: 0.58 mg/dL (ref 0.40–1.20)
GFR: 94.1 mL/min (ref >60.00)
Glucose, Bld: 101 mg/dL — ABNORMAL HIGH (ref 70–99)
Potassium: 4.6 meq/L (ref 3.5–5.1)
Sodium: 140 meq/L (ref 135–145)
Total Bilirubin: 0.6 mg/dL (ref 0.2–1.2)
Total Protein: 6.7 g/dL (ref 6.0–8.3)

## 2024-08-26 LAB — LIPID PANEL
Cholesterol: 165 mg/dL (ref 0–200)
HDL: 79.4 mg/dL (ref >39.00)
LDL Cholesterol: 69 mg/dL (ref 0–99)
NonHDL: 85.69
Total CHOL/HDL Ratio: 2
Triglycerides: 82 mg/dL (ref 0.0–149.0)
VLDL: 16.4 mg/dL (ref 0.0–40.0)

## 2024-08-26 LAB — HEMOGLOBIN A1C: Hgb A1c MFr Bld: 6.1 % (ref 4.6–6.5)

## 2024-08-26 NOTE — Patient Instructions (Addendum)
 [email or web address] -- has healthcare power of attorney forms  Can go to any pharmacy to get any vaccine you need  Prevnar 20 Shingles vaccine series Flu shot

## 2024-08-26 NOTE — Progress Notes (Signed)
 Subjective:   Isabel [name] is a 68 y.o. female who presents for an Initial Medicare Annual Wellness Visit.  Visit Complete: In person  Patient Medicare AWV questionnaire was completed by the patient on 9/22/225; I have confirmed that all information answered by patient is correct and no changes since this date.  Cardiac Risk Factors include: advanced age (>26men, >34 women)     Objective:    Today's Vitals   08/26/24 0835  BP: 110/80  Pulse: 60  Temp: 98.5 F (36.9 C)  TempSrc: Oral  SpO2: 99%  Weight: 138 lb 14.4 oz (63 kg)  Height: 5' 6 (1.676 m)   Body mass index is 22.42 kg/m.     08/26/2024    8:53 AM 12/02/2021    7:59 PM 11/22/2016   11:35 AM 11/21/2014    9:25 AM  Advanced Directives  Does Patient Have a Medical Advance Directive? No No No  No   Would patient like information on creating a medical advance directive? Yes (MAU/Ambulatory/Procedural Areas - Information given) No - Patient declined  No - patient declined information      Data saved with a previous flowsheet row definition    Current Medications (verified) Outpatient Encounter Medications as of 08/26/2024  Medication Sig   amoxicillin  (AMOXIL ) 500 MG capsule Take 4 capsules (2,000 mg total) by mouth 1 hour prior to dental visit.   Ascorbic Acid (VITA-C PO) Take 1,000 mg by mouth in the morning and at bedtime.   B Complex Vitamins (VITAMIN B COMPLEX PO) Take 1 tablet by mouth daily.   CALCIUM PO Take 333 mg by mouth daily. (Patient taking differently: Take 333 mg by mouth 2 (two) times daily.)   Cholecalciferol (VITAMIN D3 PO) Take 2,000 Units by mouth daily.   Estradiol  (YUVAFEM ) 10 MCG TABS vaginal tablet Insert 1 tablet by vaginal route once daily for 14 days then insert 1 tablet twice weekly.   Multiple Vitamin (MULTI-VITAMIN DAILY PO) Take 1 tablet by mouth daily.   OVER THE COUNTER MEDICATION Take 1 tablet by mouth 2 (two) times daily. Digestive enzyme   Probiotic Product  (PROBIOTIC-10 PO) Take 1 tablet by mouth daily.   [DISCONTINUED] BIOTIN 5000 PO Take 5,000 Units by mouth 2 (two) times daily. (Patient taking differently: Take 5,000 Units by mouth daily.)   [DISCONTINUED] Calcium Citrate 333 MG TABS Take 333 mg by mouth in the morning and at bedtime.   [DISCONTINUED] prednisoLONE  acetate (PRED FORTE ) 1 % ophthalmic suspension Instill 1 drop into left eye 6 times a day --follow drop schedule provided in clinic.   No facility-administered encounter medications on file as of 08/26/2024.    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   Breast cancer (HCC)    sees onc and gen surg    Mitral regurgitation    mild on echo from 2009, no MVP per notes   Osteoarthritis    w/ bilat hip resurfacing and possible metal reaction, sees ortho at [facility]   Scoliosis    Uterine fibroid    Varicose veins    [facility], s/p laser and inj tx   Past Surgical History:  Procedure Laterality Date   BREAST LUMPECTOMY  09/12   left breast   GANGLION CYST EXCISION     left hip resurfaced 2011     right hip resurfaced 2008  TOTAL HIP ARTHROPLASTY Left 12/08/2021   Procedure: REVISION OF HIP RESURFACEING TO TOTAL HIP ARTHROPLASTY ANTERIOR APPROACH;  Surgeon: Fidel Rogue, MD;  Location: [facility];  Service: Orthopedics;  Laterality: Left;   unterine fibroid  1998   removal   Family History  Problem Relation Age of Onset   Stroke Mother 72       hemorrhagic   Other Mother        pagets disease breast   Arthritis Mother        foot, knee problems   Hypertension Father    Hyperlipidemia Father    CAD Father 45       Triple bypass; died age 54   Arthritis Father    Dementia Father    Breast cancer Maternal Aunt    Esophageal cancer Maternal Grandfather 59   Dementia Paternal Grandmother    Heart disease Paternal Grandfather 17       multiple  heart attacks   Arthritis Paternal Uncle    Colon cancer Neg Hx    Stomach cancer Neg Hx    Thyroid  disease Neg Hx    Social History   Socioeconomic History   Marital status: Married    Spouse name: Not on file   Number of children: Not on file   Years of education: Not on file   Highest education level: Bachelor's degree (e.g., BA, AB, BS)  Occupational History   Occupation: Engineer, petroleum  Tobacco Use   Smoking status: Never   Smokeless tobacco: Never   Tobacco comments:    Non smoker  Substance and Sexual Activity   Alcohol  use: Yes    Alcohol /week: 7.0 standard drinks of alcohol     Types: 7 Glasses of wine per week   Drug use: No   Sexual activity: Yes    Birth control/protection: Post-menopausal  Other Topics Concern   Not on file  Social History Narrative   Updated 08/2015:   Art Director/Artisit - currently unemployed   Married to [employer]   No children   Exercise 2 days per week/ diet healthy   Christian   Social Drivers of Health   Financial Resource Strain: Low Risk  (08/24/2024)   Overall Financial Resource Strain (CARDIA)    Difficulty of Paying Living Expenses: Not very hard  Food Insecurity: No Food Insecurity (08/24/2024)   Hunger Vital Sign    Worried About Running Out of Food in the Last Year: Never true    Ran Out of Food in the Last Year: Never true  Transportation Needs: No Transportation Needs (08/24/2024)   PRAPARE - Administrator, Civil Service (Medical): No    Lack of Transportation (Non-Medical): No  Physical Activity: Insufficiently Active (08/24/2024)   Exercise Vital Sign    Days of Exercise per Week: 2 days    Minutes of Exercise per Session: 60 min  Stress: Stress Concern Present (08/24/2024)   [employer] of Occupational Health - Occupational Stress Questionnaire    Feeling of Stress: To some extent  Social Connections: Socially Isolated (08/24/2024)   Social Connection and Isolation Panel    Frequency of  Communication with Friends and Family: Once a week    Frequency of Social Gatherings with Friends and Family: Once a week    Attends Religious Services: Patient declined    Database administrator or Organizations: No    Attends Engineer, structural: Not on file    Marital Status: Married    Tobacco Counseling Counseling  given: No Tobacco comments: Non smoker   Clinical Intake:  Pre-visit preparation completed: No  Pain : No/denies pain        How often do you need to have someone help you when you read instructions, pamphlets, or other written materials from your doctor or pharmacy?: 1 - Never  Interpreter Needed?: No      Activities of Daily Living    08/26/2024    8:44 AM  In your present state of health, do you have any difficulty performing the following activities:  Hearing? 0  Vision? 0  Difficulty concentrating or making decisions? 0  Walking or climbing stairs? 0  Dressing or bathing? 0  Doing errands, shopping? 0  Preparing Food and eating ? N  Using the Toilet? N  In the past six months, have you accidently leaked urine? N  Do you have problems with loss of bowel control? N  Managing your Medications? N  Managing your Finances? N  Housekeeping or managing your Housekeeping? N    Patient Care Team: Ozell Heron HERO, MD as PCP - General (Family Medicine) Gorge Ade, MD as Consulting Physician (Obstetrics and Gynecology) Keenan Hastings, MD as Consulting Physician (Radiation Oncology) Arlis Curd, MD as Consulting Physician (Orthopedic Surgery) Ethyl Lenis, MD (General Surgery) Fernand Evans, MD as Consulting Physician (Hematology and Oncology)  Indicate any recent Medical Services you may have received from other than [facility] providers in the past year (date may be approximate).     Assessment:   This is a routine wellness examination for Enez.  Hearing/Vision screen Hearing Screening   500Hz  1000Hz  2000Hz  4000Hz   Right  ear Pass Pass Pass Pass  Left ear Pass Pass Pass Pass     Goals Addressed   None    Depression Screen    08/26/2024    8:34 AM 07/24/2023    9:07 AM 07/11/2022   11:37 AM 03/25/2022    2:33 PM 12/24/2021    4:05 PM 07/21/2021    9:37 AM 07/17/2020    8:35 AM  PHQ 2/9 Scores  PHQ - 2 Score 1 1 1 2 3 2  0  PHQ- 9 Score  2 3 5 8 4      Fall Risk    08/26/2024    8:32 AM 07/24/2023    8:04 AM 11/21/2014    9:28 AM  Fall Risk   Falls in the past year? 0 0 No   Number falls in past yr: 0 0   Injury with Fall? 0 0   Risk for fall due to : No Fall Risks No Fall Risks   Follow up Falls evaluation completed Falls evaluation completed      Data saved with a previous flowsheet row definition    MEDICARE RISK AT HOME: Medicare Risk at Home Any stairs in or around the home?: Yes If so, are there any without handrails?: No Home free of loose throw rugs in walkways, pet beds, electrical cords, etc?: Yes Adequate lighting in your home to reduce risk of falls?: Yes Life alert?: No Use of a cane, walker or w/c?: No Grab bars in the bathroom?: Yes Shower chair or bench in shower?: Yes Elevated toilet seat or a handicapped toilet?: Yes  TIMED UP AND GO:  Was the test performed? Yes  Length of time to ambulate 10 feet: 6 sec Gait steady and fast without use of assistive device    Cognitive Function:        08/26/2024    8:57  AM  6CIT Screen  What Year? 0 points  What month? 0 points  What time? 0 points  Count back from 20 0 points  Months in reverse 0 points  Repeat phrase 0 points  Total Score 0 points    Immunizations Immunization History  Administered Date(s) Administered   PFIZER(Purple Top)SARS-COV-2 Vaccination 03/06/2020, 03/27/2020, 11/04/2020   Tetanus 08/13/2013    TDAP status: Completed at today's visit  Flu Vaccine status: Due, Education has been provided regarding the importance of this vaccine. Advised may receive this vaccine at local pharmacy or Health  Dept. Aware to provide a copy of the vaccination record if obtained from local pharmacy or Health Dept. Verbalized acceptance and understanding.  Pneumococcal vaccine status: Due, I counseled the patient extensively  Covid-19 vaccine status: Completed vaccines  Qualifies for Shingles Vaccine? Yes   Zostavax completed No   Shingrix Completed?: No.    Education has been provided regarding the importance of this vaccine. Patient has been advised to call insurance company to determine out of pocket expense if they have not yet received this vaccine. Advised may also receive vaccine at local pharmacy or Health Dept. Verbalized acceptance and understanding.  Screening Tests Health Maintenance  Topic Date Due   Zoster Vaccines- Shingrix (1 of 2) Never done   Pneumococcal Vaccine: 50+ Years (1 of 1 - PCV) Never done   DTaP/Tdap/Td (1 - Tdap) 08/14/2013   COVID-19 Vaccine (4 - 2025-26 season) 08/05/2024   Influenza Vaccine  03/04/2025 (Originally 07/05/2024)   Mammogram  12/03/2024   Medicare Annual Wellness (AWV)  08/26/2025   Fecal DNA (Cologuard)  10/03/2025   DEXA SCAN  Completed   Hepatitis C Screening  Completed   HPV VACCINES  Aged Out   Meningococcal B Vaccine  Aged Out   Colonoscopy  Discontinued    Health Maintenance  Health Maintenance Due  Topic Date Due   Zoster Vaccines- Shingrix (1 of 2) Never done   Pneumococcal Vaccine: 50+ Years (1 of 1 - PCV) Never done   DTaP/Tdap/Td (1 - Tdap) 08/14/2013   COVID-19 Vaccine (4 - 2025-26 season) 08/05/2024    Colorectal cancer screening: Type of screening: Cologuard. Completed 09/2022. Repeat every 3 years  Mammogram status: Completed 2024. Repeat every year  Bone Density status: Completed 2023. Results reflect: Bone density results: OSTEOPENIA. Repeat every 4 years.  Lung Cancer Screening: (Low Dose CT Chest recommended if Age 41-80 years, 20 pack-year currently smoking OR have quit w/in 15years.) does not qualify.   Lung Cancer  Screening Referral: N/A  Additional Screening:  Hepatitis C Screening: does qualify; Completed 2019  Vision Screening: Recommended annual ophthalmology exams for early detection of glaucoma and other disorders of the eye. Is the patient up to date with their annual eye exam?  Yes  Who is the provider or what is the name of the office in which the patient attends annual eye exams? [facility] If pt is not established with a provider, would they like to be referred to a provider to establish care? No .   Dental Screening: Recommended annual dental exams for proper oral hygiene   Community Resource Referral / Chronic Care Management: CRR required this visit?  No   CCM required this visit?  No     Plan:     I have personally reviewed and noted the following in the patient's chart:   Medical and social history Use of alcohol , tobacco or illicit drugs  Current medications and supplements including  opioid prescriptions. Patient is not currently taking opioid prescriptions. Functional ability and status Nutritional status Physical activity Advanced directives List of other physicians Hospitalizations, surgeries, and ER visits in previous 12 months Vitals Screenings to include cognitive, depression, and falls Referrals and appointments  In addition, I have reviewed and discussed with patient certain preventive protocols, quality metrics, and best practice recommendations. A written personalized care plan for preventive services as well as general preventive health recommendations were provided to patient.   Problem List Items Addressed This Visit   None Visit Diagnoses       Encounter for Medicare annual wellness exam    -  Primary   Relevant Orders   CMP     Diabetes mellitus screening       Relevant Orders   Hemoglobin A1c     Lipid screening       Relevant Orders   Lipid panel     Cobaltosis (HCC)       Relevant Orders   Chromium level   [place], Serum/Plasma      Breast cancer screening by mammogram       Relevant Orders   MM 3D SCREENING MAMMOGRAM BILATERAL BREAST        Heron CHRISTELLA Sharper, MD   08/26/2024   After Visit Summary: (In Person-Printed) AVS printed and given to the patient  Nurse Notes: N/A

## 2024-08-27 ENCOUNTER — Ambulatory Visit: Payer: Self-pay | Admitting: Family Medicine

## 2024-08-27 DIAGNOSIS — E042 Nontoxic multinodular goiter: Secondary | ICD-10-CM

## 2024-08-27 DIAGNOSIS — E559 Vitamin D deficiency, unspecified: Secondary | ICD-10-CM

## 2024-08-30 ENCOUNTER — Ambulatory Visit (INDEPENDENT_AMBULATORY_CARE_PROVIDER_SITE_OTHER)

## 2024-08-30 DIAGNOSIS — B351 Tinea unguium: Secondary | ICD-10-CM

## 2024-08-30 NOTE — Progress Notes (Signed)
 Patient presents today for the 4th laser treatment. Diagnosed with mycotic nail infection by Dr. Gershon.   Toenail most affected bilateral 2nd-5th.  All other systems are negative.  Nails were filed thin. Laser therapy was administered to 2nd-5th toenails bilaterally and patient tolerated the treatment well. All safety precautions were in place. Patient has significant loss of nail on multiple toes. Educated on using caution when trimming nail length and filing thickness at home. Patient verbalized understanding. She also expresses concerns about the treatment not working. She does have a follow up scheduled next month with Dr. Gershon and will discuss these concerns with him.   Post treatment instructions reviewed and provided to patient. Patient had no questions regarding plan of care.   Follow up in 6 weeks for laser # 5.

## 2024-08-30 NOTE — Progress Notes (Deleted)
  Isabel [name]  Date of Visit: *** Date of Surgery: *** Surgery Performed: *** Surgeon: ***   Subjective: -Patient Complaints: None/Pain/Swelling/Numbness/Bleeding/Drainage/Other -Pain Level (0-10): ***{PAIN DESCRIPTION:21022940::intermittent,constant,sharp,burning,dull,stabbing,tingling,aching} -Medication Compliance: antibiotics/pain medication -Wound Care adherence: yes/no -Weight Bearing adherence: yes/no -Changes since surgery: {Denies/complains:31533} -New Symptoms: {Denies/complains:31533}   Objective: -Vital Signs:

## 2024-08-31 LAB — COBALT, SERUM/PLASMA: Cobalt, Serum/Plasma: 2.4 ug/L — ABNORMAL HIGH (ref 0.1–0.4)

## 2024-08-31 LAB — CHROMIUM LEVEL

## 2024-08-31 LAB — EXTRA SPECIMEN

## 2024-08-31 LAB — CHROMIUM, PLASMA: CHROMIUM: 12.5 ug/L — ABNORMAL HIGH (ref <3.6)

## 2024-09-02 NOTE — Progress Notes (Signed)
 Vitamin D  level added to furture orders

## 2024-09-12 ENCOUNTER — Other Ambulatory Visit (HOSPITAL_COMMUNITY): Payer: Self-pay

## 2024-09-23 ENCOUNTER — Ambulatory Visit: Admitting: Podiatry

## 2024-09-23 DIAGNOSIS — L603 Nail dystrophy: Secondary | ICD-10-CM | POA: Diagnosis not present

## 2024-09-23 DIAGNOSIS — M7742 Metatarsalgia, left foot: Secondary | ICD-10-CM

## 2024-09-23 DIAGNOSIS — M778 Other enthesopathies, not elsewhere classified: Secondary | ICD-10-CM

## 2024-09-23 DIAGNOSIS — M7741 Metatarsalgia, right foot: Secondary | ICD-10-CM | POA: Diagnosis not present

## 2024-09-23 NOTE — Progress Notes (Unsigned)
Left foot.

## 2024-09-25 NOTE — Progress Notes (Signed)
 LM for patient to please call office so that I can get her in to scan for new orthotics and No charge  Lolita Schultze Cped, CFo, CFm

## 2024-10-03 ENCOUNTER — Other Ambulatory Visit (HOSPITAL_COMMUNITY): Payer: Self-pay

## 2024-10-03 MED ORDER — AMOXICILLIN 500 MG PO CAPS
500.0000 mg | ORAL_CAPSULE | Freq: Three times a day (TID) | ORAL | 1 refills | Status: DC
  Filled 2024-10-03: qty 21, 7d supply, fill #0

## 2024-10-03 MED ORDER — CHLORHEXIDINE GLUCONATE 0.12 % MT SOLN
15.0000 mL | Freq: Two times a day (BID) | OROMUCOSAL | 0 refills | Status: DC
  Filled 2024-10-03: qty 473, 16d supply, fill #0

## 2024-10-07 ENCOUNTER — Other Ambulatory Visit (HOSPITAL_COMMUNITY): Payer: Self-pay

## 2024-10-11 ENCOUNTER — Other Ambulatory Visit

## 2024-10-14 ENCOUNTER — Other Ambulatory Visit (HOSPITAL_COMMUNITY): Payer: Self-pay

## 2024-10-14 MED ORDER — AMOXICILLIN 500 MG PO CAPS
500.0000 mg | ORAL_CAPSULE | Freq: Three times a day (TID) | ORAL | 1 refills | Status: AC
  Filled 2024-10-14: qty 21, 7d supply, fill #0

## 2024-10-15 ENCOUNTER — Ambulatory Visit

## 2024-10-15 ENCOUNTER — Ambulatory Visit (INDEPENDENT_AMBULATORY_CARE_PROVIDER_SITE_OTHER): Admitting: Podiatry

## 2024-10-15 DIAGNOSIS — L603 Nail dystrophy: Secondary | ICD-10-CM | POA: Diagnosis not present

## 2024-10-15 NOTE — Progress Notes (Signed)
 Patient was seen today for nail culture.  This was discussed last appointment.  She stopped using the topical medication.  I sharply debrided the nails today complications with masses for culture, pathology.  Will let her know the results of that.  There is no charge for today's visit.  I wanted to hold off on the culture last appointment given current use of antifungal medication.  I want to stop using this in order to proceed with a more appropriate culture.

## 2024-10-15 NOTE — Progress Notes (Signed)
 Remade orthotics were fit today  No charge  Patient is happy with fit and will try and will call office if any problems arise  Patient was give wear care and break in instructions  Isabel [name]

## 2024-10-15 NOTE — Addendum Note (Signed)
 Addended by: GIB MABLE SAUNDERS on: 10/15/2024 02:49 PM   Modules accepted: Orders

## 2024-10-22 ENCOUNTER — Ambulatory Visit: Payer: Self-pay | Admitting: Podiatry

## 2024-10-25 ENCOUNTER — Other Ambulatory Visit: Payer: Self-pay | Admitting: Podiatry

## 2024-10-25 ENCOUNTER — Encounter: Payer: Self-pay | Admitting: Family Medicine

## 2024-10-25 DIAGNOSIS — M1990 Unspecified osteoarthritis, unspecified site: Secondary | ICD-10-CM

## 2024-10-25 DIAGNOSIS — R7303 Prediabetes: Secondary | ICD-10-CM

## 2024-10-25 NOTE — Telephone Encounter (Signed)
 Irma- can you call [facility] or [facility] to see if they can compound urea cream and a steroid (such as triamcinolone ) for toenails?

## 2024-10-28 DIAGNOSIS — M1990 Unspecified osteoarthritis, unspecified site: Secondary | ICD-10-CM | POA: Diagnosis not present

## 2024-10-28 NOTE — Telephone Encounter (Signed)
 Please print the A1C order for the patient to pick up.

## 2024-10-30 ENCOUNTER — Telehealth: Payer: Self-pay | Admitting: *Deleted

## 2024-10-30 NOTE — Telephone Encounter (Signed)
 Spoke with the patient and requested she call back with the fax number of the lab she prefers to go to  and I will be glad to fax the lab order.

## 2024-10-30 NOTE — Telephone Encounter (Signed)
 Copied from CRM #[id number]. Topic: Clinical - Request for Lab/Test Order >> Oct 28, 2024  4:11 PM Sophia H wrote: Reason for CRM: Patient is wanting to know if A1C orders can be faxed directly over to [facility] - states if she has to come in and pick up orders she might as well just get the blood work done at clinic. Please advise, wants to get this done around 12/22. # [phone]

## 2024-11-02 ENCOUNTER — Other Ambulatory Visit (HOSPITAL_COMMUNITY): Payer: Self-pay

## 2024-11-03 ENCOUNTER — Other Ambulatory Visit (HOSPITAL_COMMUNITY): Payer: Self-pay

## 2024-11-05 ENCOUNTER — Other Ambulatory Visit: Payer: Self-pay

## 2024-11-05 ENCOUNTER — Ambulatory Visit: Payer: Self-pay | Admitting: Podiatry

## 2024-11-06 LAB — ANTI-CCP AB, IGG + IGA (RDL): Anti-CCP Ab, IgG + IgA (RDL): <20 U (ref <20)

## 2024-11-06 LAB — ANA: Anti Nuclear Antibody (ANA): NEGATIVE

## 2024-11-06 LAB — SEDIMENTATION RATE: Sed Rate: 2 mm/h (ref 0–40)

## 2024-11-06 LAB — RHEUMATOID FACTOR: Rheumatoid fact SerPl-aCnc: 10.1 [IU]/mL (ref <14.0)

## 2024-11-06 LAB — C-REACTIVE PROTEIN: CRP: <1 mg/L (ref 0–10)

## 2024-11-06 LAB — HLA-B27 ANTIGEN: HLA B27: NEGATIVE

## 2024-11-07 NOTE — Telephone Encounter (Signed)
 Irma- did you speak with Penne from [place] about this?

## 2024-11-09 ENCOUNTER — Other Ambulatory Visit (HOSPITAL_COMMUNITY): Payer: Self-pay

## 2024-11-11 ENCOUNTER — Ambulatory Visit: Admitting: Podiatry

## 2024-11-11 ENCOUNTER — Ambulatory Visit (INDEPENDENT_AMBULATORY_CARE_PROVIDER_SITE_OTHER)

## 2024-11-11 DIAGNOSIS — M778 Other enthesopathies, not elsewhere classified: Secondary | ICD-10-CM

## 2024-11-11 DIAGNOSIS — M7752 Other enthesopathy of left foot: Secondary | ICD-10-CM | POA: Diagnosis not present

## 2024-11-11 DIAGNOSIS — L603 Nail dystrophy: Secondary | ICD-10-CM | POA: Diagnosis not present

## 2024-11-11 DIAGNOSIS — M2062 Acquired deformities of toe(s), unspecified, left foot: Secondary | ICD-10-CM | POA: Diagnosis not present

## 2024-11-11 DIAGNOSIS — L84 Corns and callosities: Secondary | ICD-10-CM | POA: Diagnosis not present

## 2024-11-11 NOTE — Telephone Encounter (Signed)
 done

## 2024-11-11 NOTE — Progress Notes (Signed)
 Subjective: Chief Complaint  Patient presents with   Toe Pain    Left foot 5th toe pain    68 year old female presents the office with above concerns.  She has a new concern of pain in between her 4th and 5th toes in the left foot.  She does not report any recent injuries.  No recent treatment for this.  Still awaiting compound cream for her nails.  Objective: AAO x3, NAD DP/PT pulses palpable bilaterally, CRT less than 3 seconds Nails remain the same, unchanged.  Today there is a new hyperkeratotic lesion on the medial aspect of the fifth toe with lateral aspect of the fourth toe on the left foot without any underlying ulceration, drainage or signs of infection.  Digital contractures are present.  Tenderness palpation on the skin lesions. No pain with calf compression, swelling, warmth, erythema  Assessment: Digital deformity resulting hyperkeratotic lesions  Plan: -All treatment options discussed with the patient including all alternatives, risks, complications.  -X-rays obtained reviewed.  Multiple views of the left foot were obtained.  No evidence of acute fracture.  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 -As a courtesy I debrided the calluses to any complications or bleeding in between the toes.  Dispensed offloading pads.  Discussed shoe modifications avoid pressure. -I have ordered a compound cream through [facility] to include urea, steroid as well as terbinafine.  We have called this into the pharmacy for her. -Patient encouraged to call the office with any questions, concerns, change in symptoms.   Isabel [name] DPM

## 2024-11-21 ENCOUNTER — Encounter: Payer: Self-pay | Admitting: Podiatrist

## 2024-11-21 NOTE — Progress Notes (Signed)
 Orthotics adjusted.  Medial phalange removed.  Will call Karmela and let her know she may pick up at her convenience.

## 2024-11-25 ENCOUNTER — Other Ambulatory Visit

## 2024-11-25 ENCOUNTER — Ambulatory Visit: Payer: Self-pay | Admitting: Family Medicine

## 2024-11-25 DIAGNOSIS — E042 Nontoxic multinodular goiter: Secondary | ICD-10-CM

## 2024-11-25 DIAGNOSIS — R7303 Prediabetes: Secondary | ICD-10-CM

## 2024-11-25 DIAGNOSIS — E559 Vitamin D deficiency, unspecified: Secondary | ICD-10-CM | POA: Diagnosis not present

## 2024-11-25 LAB — TSH: TSH: 0.73 u[IU]/mL (ref 0.35–5.50)

## 2024-11-25 LAB — VITAMIN D 25 HYDROXY (VIT D DEFICIENCY, FRACTURES): VITD: 60.51 ng/mL (ref 30.00–100.00)

## 2024-11-26 LAB — HEMOGLOBIN A1C
Est. average glucose Bld gHb Est-mCnc: 114 mg/dL
Hgb A1c MFr Bld: 5.6 % (ref 4.8–5.6)

## 2024-12-02 ENCOUNTER — Encounter: Payer: Self-pay | Admitting: Family Medicine

## 2024-12-03 ENCOUNTER — Telehealth: Payer: Self-pay | Admitting: *Deleted

## 2024-12-03 NOTE — Telephone Encounter (Signed)
 I called and informed the patient that her orthotics are ready and she can pick them up.  She said it would probably be Monday before she can get them.

## 2024-12-09 ENCOUNTER — Telehealth: Payer: Self-pay

## 2024-12-09 ENCOUNTER — Telehealth: Payer: Self-pay | Admitting: Family Medicine

## 2024-12-09 NOTE — Telephone Encounter (Signed)
 Pt last visit 07/01/2024 for L knee. Pt is still havig trouble and has scheduled PT at [facility] later this month.  Patient requesting we send a referral to [facility] for this service, fax [phone].

## 2024-12-09 NOTE — Telephone Encounter (Signed)
 Orthotics picked up by pt

## 2024-12-10 DIAGNOSIS — M1712 Unilateral primary osteoarthritis, left knee: Secondary | ICD-10-CM

## 2024-12-16 LAB — HM MAMMOGRAPHY

## 2024-12-17 ENCOUNTER — Encounter: Payer: Self-pay | Admitting: Family Medicine

## 2025-01-02 ENCOUNTER — Ambulatory Visit: Payer: Self-pay | Admitting: Family Medicine

## 2025-01-08 ENCOUNTER — Other Ambulatory Visit: Payer: Self-pay

## 2025-01-08 ENCOUNTER — Other Ambulatory Visit (HOSPITAL_COMMUNITY): Payer: Self-pay

## 2025-01-08 MED ORDER — FLUOROURACIL 5 % EX CREA
1.0000 | TOPICAL_CREAM | Freq: Every day | CUTANEOUS | 0 refills | Status: AC
  Filled 2025-01-08: qty 40, 30d supply, fill #0

## 2025-01-09 ENCOUNTER — Other Ambulatory Visit: Payer: Self-pay

## 2025-01-13 ENCOUNTER — Ambulatory Visit: Admitting: Podiatry

## 2025-02-17 ENCOUNTER — Ambulatory Visit: Admitting: Podiatry
